# Patient Record
Sex: Male | Born: 1960 | Race: White | Hispanic: No | Marital: Single | Smoking: Never smoker
Health system: Southern US, Community
[De-identification: ages and names within clinical notes are randomized; demographics above are authoritative.]

## PROBLEM LIST (undated history)

## (undated) DIAGNOSIS — E039 Hypothyroidism, unspecified: Secondary | ICD-10-CM

## (undated) DIAGNOSIS — I82409 Acute embolism and thrombosis of unspecified deep veins of unspecified lower extremity: Secondary | ICD-10-CM

## (undated) DIAGNOSIS — S129XXA Fracture of neck, unspecified, initial encounter: Secondary | ICD-10-CM

## (undated) DIAGNOSIS — I499 Cardiac arrhythmia, unspecified: Secondary | ICD-10-CM

## (undated) DIAGNOSIS — I4891 Unspecified atrial fibrillation: Secondary | ICD-10-CM

## (undated) DIAGNOSIS — F819 Developmental disorder of scholastic skills, unspecified: Secondary | ICD-10-CM

## (undated) DIAGNOSIS — K219 Gastro-esophageal reflux disease without esophagitis: Secondary | ICD-10-CM

## (undated) DIAGNOSIS — I4892 Unspecified atrial flutter: Secondary | ICD-10-CM

## (undated) DIAGNOSIS — S79919A Unspecified injury of unspecified hip, initial encounter: Secondary | ICD-10-CM

## (undated) HISTORY — DX: Fracture of neck, unspecified, initial encounter: S12.9XXA

## (undated) HISTORY — PX: CARDIAC ELECTROPHYSIOLOGY STUDY AND ABLATION: SHX1294

## (undated) HISTORY — PX: NECK SURGERY: SHX720

## (undated) HISTORY — DX: Unspecified atrial flutter: I48.92

## (undated) HISTORY — PX: INGUINAL HERNIA REPAIR: SUR1180

---

## 2009-03-07 ENCOUNTER — Encounter: Admission: RE | Admit: 2009-03-07 | Discharge: 2009-03-07 | Payer: Self-pay | Admitting: Cardiology

## 2009-09-18 ENCOUNTER — Ambulatory Visit (HOSPITAL_COMMUNITY): Admission: RE | Admit: 2009-09-18 | Discharge: 2009-09-18 | Payer: Self-pay | Admitting: Cardiology

## 2010-05-15 DIAGNOSIS — I4892 Unspecified atrial flutter: Secondary | ICD-10-CM | POA: Insufficient documentation

## 2010-05-16 ENCOUNTER — Ambulatory Visit: Payer: Self-pay | Admitting: Internal Medicine

## 2010-05-18 ENCOUNTER — Telehealth: Payer: Self-pay | Admitting: Internal Medicine

## 2010-05-28 ENCOUNTER — Telehealth: Payer: Self-pay | Admitting: Internal Medicine

## 2010-06-07 ENCOUNTER — Encounter: Payer: Self-pay | Admitting: Internal Medicine

## 2010-06-12 ENCOUNTER — Ambulatory Visit: Payer: Self-pay | Admitting: Internal Medicine

## 2010-06-12 LAB — CONVERTED CEMR LAB
BUN: 14 mg/dL (ref 6–23)
CO2: 30 meq/L (ref 19–32)
Calcium: 9.6 mg/dL (ref 8.4–10.5)
Creatinine, Ser: 0.8 mg/dL (ref 0.4–1.5)
Eosinophils Absolute: 0.1 10*3/uL (ref 0.0–0.7)
Eosinophils Relative: 1.5 % (ref 0.0–5.0)
Glucose, Bld: 42 mg/dL — CL (ref 70–99)
INR: 1 (ref 0.8–1.0)
Lymphocytes Relative: 29.6 % (ref 12.0–46.0)
MCHC: 34.9 g/dL (ref 30.0–36.0)
MCV: 100.6 fL — ABNORMAL HIGH (ref 78.0–100.0)
Monocytes Absolute: 0.5 10*3/uL (ref 0.1–1.0)
Neutrophils Relative %: 56.4 % (ref 43.0–77.0)
Platelets: 242 10*3/uL (ref 150.0–400.0)
Potassium: 4.3 meq/L (ref 3.5–5.1)
Prothrombin Time: 11 s (ref 9.7–11.8)
RBC: 4.73 M/uL (ref 4.22–5.81)
Sodium: 141 meq/L (ref 135–145)
WBC: 4.6 10*3/uL (ref 4.5–10.5)
aPTT: 36.4 s — ABNORMAL HIGH (ref 21.7–28.8)

## 2010-06-19 ENCOUNTER — Ambulatory Visit: Payer: Self-pay | Admitting: Internal Medicine

## 2010-06-19 ENCOUNTER — Observation Stay (HOSPITAL_COMMUNITY): Admission: RE | Admit: 2010-06-19 | Discharge: 2010-06-20 | Payer: Self-pay | Admitting: Internal Medicine

## 2010-06-21 ENCOUNTER — Telehealth: Payer: Self-pay | Admitting: Internal Medicine

## 2010-07-30 ENCOUNTER — Ambulatory Visit: Payer: Self-pay | Admitting: Internal Medicine

## 2011-01-24 NOTE — Progress Notes (Signed)
Summary: HAS POST SURGERY QUESTIONS  Phone Note Call from Patient   Caller: Patient [phone] Reason for Call: Talk to Nurse Summary of Call: HAS POST SURGERY QUESTIONS-PLS CALLL [phone] Initial call taken by: Glynda Jaeger,  June 21, 2010 2:38 PM  Follow-up for Phone Call        still having to change the bandage in groin area daily.  Stilling having some bleeding but not bad.  he is going t o continue to watch area.  He does not feel like it is excessive.  Will moave appontment up to 07/30/10 because he says Dr Johney Frame says f/u in 4 weeks Dennis Bast, RN, BSN  June 21, 2010 4:42 PM

## 2011-01-24 NOTE — Progress Notes (Signed)
Summary: med question  Phone Note Call from Patient Call back at Home Phone [phone]   Caller: Patient Reason for Call: Talk to Nurse Summary of Call: calling back about meds, request call back Initial call taken by: [name],  May 28, 2010 9:42 AM  Follow-up for Phone Call        pt will need his Pradaxa called in   Called into [facility] Dennis Bast, RN, BSN  May 28, 2010 10:03 AM    New/Updated Medications: PRADAXA 150 MG CAPS (DABIGATRAN ETEXILATE MESYLATE) one by mouth two times a day with meals Prescriptions: PRADAXA 150 MG CAPS (DABIGATRAN ETEXILATE MESYLATE) one by mouth two times a day with meals  #60 x 1   Entered by:   Dennis Bast, RN, BSN   Authorized by:   Hillis Range, MD   Signed by:   Dennis Bast, RN, BSN on 05/28/2010   Method used:   Electronically to        [facility] (retail)       [address]       [place], [place]  [address]       Ph: [phone]       Fax: [phone]   RxID:   [id number]

## 2011-01-24 NOTE — Assessment & Plan Note (Signed)
Summary: eph/ gd   Visit Type:  follow-up  Referring Provider:  Viann Fish, MD Primary Provider:  Catha Gosselin, MD  [facility]   History of Present Illness: The patient presents today for routine electrophysiology followup. He reports doing very well since his atrial flutter ablation. The patient denies symptoms of palpitations, chest pain, shortness of breath, orthopnea, PND, lower extremity edema, dizziness, presyncope, syncope, or neurologic sequela. The patient is tolerating medications without difficulties and is otherwise without complaint today.   Current Medications (verified): 1)  None  Allergies (verified): No Known Drug Allergies  Past History:  Past Medical History: Atrial flutter s/p CTI ablation 6/11 mental retardation seasonal allergies  Past Surgical History: hernia repair prior cervical disc surgery CTI ablation for atrial flutter 06/20/10  Vital Signs:  Patient profile:   50 year old male Height:      77 inches Weight:      192 pounds BMI:     22.85 Pulse rate:   71 / minute BP sitting:   104 / 82  (left arm)  Vitals Entered By: Laurance Flatten CMA (July 30, 2010 1:54 PM)  Physical Exam  General:  tall and thin, NAD Head:  normocephalic and atraumatic Eyes:  PERRLA/EOM intact; conjunctiva and lids normal. Mouth:  Teeth, gums and palate normal. Oral mucosa normal. Neck:  Neck supple, no JVD. No masses, thyromegaly or abnormal cervical nodes. Lungs:  Clear bilaterally to auscultation and percussion. Heart:  Non-displaced PMI, chest non-tender; regular rate and rhythm, S1, S2 without murmurs, rubs or gallops. Carotid upstroke normal, no bruit. Normal abdominal aortic size, no bruits. Femorals normal pulses, no bruits. Pedals normal pulses. No edema, no varicosities. Abdomen:  Bowel sounds positive; abdomen soft and non-tender without masses, organomegaly, or hernias noted. No hepatosplenomegaly. Msk:  Back normal, normal gait. Muscle strength and tone  normal. Pulses:  pulses normal in all 4 extremities Extremities:  No clubbing or cyanosis. Neurologic:  Alert and oriented x 3.   EKG  Procedure date:  07/30/2010  Findings:      sinus rhythmj 70 bpm, otherwise normal ekg  Impression & Recommendations:  Problem # 1:  ATRIAL FLUTTER (ICD-427.32)  doing well s/p ablation stop pradaxa ASA 81mg  daily and Dr Donnie Aho to monitor for further atrial arrhythmias   His updated medication list for this problem includes:    Aspir-low 81 Mg Tbec (Aspirin)  Other Orders: EKG w/ Interpretation (93000)  Patient Instructions: 1)  Your physician recommends that you schedule a follow-up appointment as needed with DrAllred 2)  Your physician has recommended you make the following change in your medication: stop Pradaxa and start Aspirin 81mg  daily  Prevention & Chronic Care Immunizations   Influenza vaccine: Not documented    Tetanus booster: Not documented    Pneumococcal vaccine: Not documented  Other Screening   Smoking status: Not documented  Lipids   Total Cholesterol: Not documented   LDL: Not documented   LDL Direct: Not documented   HDL: Not documented   Triglycerides: Not documented

## 2011-01-24 NOTE — Assessment & Plan Note (Signed)
Summary: nep/atrial flutter/jml   Visit Type:  Initial Consult Referring Provider:  Viann Fish, MD Primary Provider:  Catha Gosselin, MD  [facility]  CC:  atrial flutter.  History of Present Illness: Joshua Moyer is a pleasant 50 yo WM with a h/o persistent atrial flutter who presents today for EP consultation.  He reports being diagnosed with atrial fibrillation 1 year ago after presenting for a routine physical exam.  He was referred to Dr Donnie Aho who started diltiazem for rate control.  He was cardioverted to sinus rhythm 08/2009.  The patient reports slight improvement in energy after cardioversion, but feels that in general his activity was unchanged.  He returned to see Dr Donnie Aho 4/11 and was found to have returned to atrial flutter.  Presently, the patient reports doing well.  The patient denies symptoms of palpitations, chest pain, shortness of breath, orthopnea, PND, lower extremity edema, presyncope, syncope, or neurologic sequela.  He has chronic stable orthostatic dizziness, which improves by not standing up too quickly.  The patient is tolerating medications without difficulties and is otherwise without complaint today.   Current Medications (verified): 1)  Diltiazem Hcl Er Beads 240 Mg Xr24h-Cap (Diltiazem Hcl Er Beads) .... Take One Capsule By Mouth Daily  Allergies (verified): No Known Drug Allergies  Past History:  Past Medical History: Atrial flutter (typical by EKG) mental retardation seasonal allergies  Past Surgical History: hernia repair prior cervical disc surgery  Family History: mother had diabetes father had CAD dx age 17s  Social History: Pt lives in [place] with his father.  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  He has developmental disability.  Tob- none.  ETOH- none.  Drugs- none  Review of Systems       All systems are reviewed and negative except as listed in the HPI.   Vital Signs:  Patient profile:   50 year old  male Height:      77 inches Weight:      194 pounds BMI:     23.09 Pulse rate:   86 / minute BP sitting:   122 / 90  (left arm)  Vitals Entered By: Laurance Flatten CMA (May 16, 2010 4:33 PM)  Physical Exam  General:  tall and thin, NAD Head:  normocephalic and atraumatic Eyes:  PERRLA/EOM intact; conjunctiva and lids normal. Nose:  no deformity, discharge, inflammation, or lesions Mouth:  Teeth, gums and palate normal. Oral mucosa normal. Neck:  Neck supple, no JVD. No masses, thyromegaly or abnormal cervical nodes. Lungs:  Clear bilaterally to auscultation and percussion. Heart:  iRRR, no m/r/g Abdomen:  Bowel sounds positive; abdomen soft and non-tender without masses, organomegaly, or hernias noted. No hepatosplenomegaly. Msk:  Back normal, normal gait. Muscle strength and tone normal. Pulses:  pulses normal in all 4 extremities Extremities:  No clubbing or cyanosis. Neurologic:  Alert and oriented x 3. Skin:  Intact without lesions or rashes. Cervical Nodes:  no significant adenopathy Psych:  cognative delay is noted   EKG  Procedure date:  05/16/2010  Findings:      typical appearing atrial flutter  Impression & Recommendations:  Problem # 1:  ATRIAL FLUTTER (ICD-427.32) The patient presents today for EP consultation regarding atrial flutter.  Therapeutic strategies for atrial flutter including medicine and ablation were discussed in detail with the patient today. Risk, benefits, and alternatives to EP study and radiofrequency ablation  were also discussed in detail today. These risks include but are not limited to  stroke, bleeding, vascular damage, tamponade, perforation, damage to the heart and other structures, pacemaker requirement, and death. The patient understands these risk and wishes to proceed.  We will start pradaxa 150mg  two times a day today.  After four weeks of pradaxa, we will proceed to atrial flutter ablation.  Other Orders: EKG w/ Interpretation (93000)

## 2011-01-24 NOTE — Progress Notes (Signed)
Summary: pt calling to set up procedure  lm to cb  Phone Note Call from Patient Call back at Home Phone [phone]   Caller: Patient Reason for Call: Talk to Nurse, Talk to Doctor, Referral Summary of Call: pt was calling to set up procedure Initial call taken by: Omer Jack,  May 18, 2010 10:14 AM  Follow-up for Phone Call        [facility] Lisabeth Devoid RN  returning call, [name]  May 18, 2010 1:50 PM  Joshua Moyer would like to know the date and time of his ablation. I told him Tresa Endo was out of the office today and would call him back next week.  He said that would be okay. Lisabeth Devoid RN     Appended Document: pt calling to set up procedure  lm to cb Tresa Endo, please follow-up on this.  Appended Document: pt calling to set up procedure  lm to cb already set up 06/19/10

## 2011-01-24 NOTE — Letter (Signed)
Summary: ELectrophysiology/Ablation Procedure Instructions  [facility], Main Office  [address]   [place], [place] [address]   Phone: [phone]  Fax: [phone]     Electrophysiology/Ablation Procedure Instructions    You are scheduled for a(n) ablation on 06/19/10 at 12:00 with Dr. Johney Frame.  1.  Please come to the Short Stay Center at [facility] at 10:00 on the day of your procedure.  2.  Come prepared to stay overnight.   Please bring your insurance cards and a list of your medications.  3.  Come to the [place] office on 06/12/10 at 10:00am for lab work.  You do not have to be fasting.  4.  Do not have anything to eat or drink after midnight the night before your procedure.  5.  Educational material received:     _____ Ablation   * Occasionally, EP studies and ablations can become lengthy.  Please make your family aware of this before your procedure starts.  Average time ranges from 2-8 hours for EP studies/ablations.  Your physician will locate your family after the procedure with the results.  * If you have any questions after you get home, please call the office at [phone].  Anselm Pancoast

## 2011-03-29 LAB — BASIC METABOLIC PANEL
BUN: 15 mg/dL (ref 6–23)
Calcium: 9.3 mg/dL (ref 8.4–10.5)
GFR calc non Af Amer: >60 mL/min (ref >60)
Glucose, Bld: 84 mg/dL (ref 70–99)

## 2011-03-29 LAB — PROTIME-INR
INR: 2.4 — ABNORMAL HIGH (ref 0.00–1.49)
Prothrombin Time: 26.1 seconds — ABNORMAL HIGH (ref 11.6–15.2)

## 2011-03-29 LAB — CBC
Platelets: 223 10*3/uL (ref 150–400)
RDW: 13 % (ref 11.5–15.5)
WBC: 4.1 10*3/uL (ref 4.0–10.5)

## 2012-03-27 ENCOUNTER — Encounter: Payer: Self-pay | Admitting: Internal Medicine

## 2012-04-07 ENCOUNTER — Ambulatory Visit: Payer: Medicare Other | Attending: Family Medicine | Admitting: Physical Therapy

## 2012-04-07 DIAGNOSIS — M25579 Pain in unspecified ankle and joints of unspecified foot: Secondary | ICD-10-CM | POA: Insufficient documentation

## 2012-04-07 DIAGNOSIS — M25673 Stiffness of unspecified ankle, not elsewhere classified: Secondary | ICD-10-CM | POA: Insufficient documentation

## 2012-04-07 DIAGNOSIS — IMO0001 Reserved for inherently not codable concepts without codable children: Secondary | ICD-10-CM | POA: Insufficient documentation

## 2012-04-07 DIAGNOSIS — M25676 Stiffness of unspecified foot, not elsewhere classified: Secondary | ICD-10-CM | POA: Insufficient documentation

## 2012-04-09 ENCOUNTER — Ambulatory Visit: Payer: Medicare Other | Admitting: Physical Therapy

## 2012-04-14 ENCOUNTER — Ambulatory Visit: Payer: Medicare Other | Admitting: Physical Therapy

## 2012-04-16 ENCOUNTER — Ambulatory Visit: Payer: Medicare Other | Admitting: Physical Therapy

## 2012-04-21 ENCOUNTER — Ambulatory Visit: Payer: Medicare Other | Admitting: Physical Therapy

## 2012-04-23 ENCOUNTER — Ambulatory Visit: Payer: Medicare Other | Attending: Family Medicine | Admitting: Physical Therapy

## 2012-04-23 DIAGNOSIS — M25579 Pain in unspecified ankle and joints of unspecified foot: Secondary | ICD-10-CM | POA: Insufficient documentation

## 2012-04-23 DIAGNOSIS — IMO0001 Reserved for inherently not codable concepts without codable children: Secondary | ICD-10-CM | POA: Insufficient documentation

## 2012-04-23 DIAGNOSIS — M25676 Stiffness of unspecified foot, not elsewhere classified: Secondary | ICD-10-CM | POA: Insufficient documentation

## 2012-04-23 DIAGNOSIS — M25673 Stiffness of unspecified ankle, not elsewhere classified: Secondary | ICD-10-CM | POA: Insufficient documentation

## 2012-04-28 ENCOUNTER — Ambulatory Visit: Payer: Medicare Other | Admitting: Physical Therapy

## 2012-04-30 ENCOUNTER — Ambulatory Visit: Payer: Medicare Other | Admitting: Physical Therapy

## 2012-05-01 ENCOUNTER — Encounter: Payer: Self-pay | Admitting: Internal Medicine

## 2012-05-01 ENCOUNTER — Ambulatory Visit (AMBULATORY_SURGERY_CENTER): Payer: Medicare Other

## 2012-05-01 VITALS — Ht 78.0 in | Wt 192.0 lb

## 2012-05-01 DIAGNOSIS — Z1211 Encounter for screening for malignant neoplasm of colon: Secondary | ICD-10-CM

## 2012-05-01 MED ORDER — PEG-KCL-NACL-NASULF-NA ASC-C 100 G PO SOLR: 1.0000 | Freq: Once | ORAL | Status: AC

## 2012-05-01 NOTE — Progress Notes (Signed)
Patient's father came with the patient to his pre-visit today to help explain the paperwork. Per the father,the patient is a slow learner and has some trouble reading, but he will come with his son to the colonoscopy appointment on 05/15/12.Ulis Rias RN

## 2012-05-05 ENCOUNTER — Ambulatory Visit: Payer: Medicare Other | Admitting: Physical Therapy

## 2012-05-15 ENCOUNTER — Ambulatory Visit (AMBULATORY_SURGERY_CENTER): Payer: Medicare Other | Admitting: Internal Medicine

## 2012-05-15 ENCOUNTER — Encounter: Payer: Self-pay | Admitting: Internal Medicine

## 2012-05-15 VITALS — BP 132/67 | HR 57 | Temp 97.7°F | Resp 13 | Ht 78.0 in | Wt 192.0 lb

## 2012-05-15 DIAGNOSIS — D126 Benign neoplasm of colon, unspecified: Secondary | ICD-10-CM

## 2012-05-15 DIAGNOSIS — Z1211 Encounter for screening for malignant neoplasm of colon: Secondary | ICD-10-CM

## 2012-05-15 MED ORDER — SODIUM CHLORIDE 0.9 % IV SOLN: 500.0000 mL | INTRAVENOUS | Status: DC

## 2012-05-15 MED ORDER — DICYCLOMINE HCL 10 MG PO CAPS: 20.0000 mg | ORAL_CAPSULE | Freq: Three times a day (TID) | ORAL | Status: DC

## 2012-05-15 NOTE — Patient Instructions (Addendum)
YOU HAD AN ENDOSCOPIC PROCEDURE TODAY AT THE [facility]: Refer to the procedure report that was given to you for any specific questions about what was found during the examination.  If the procedure report does not answer your questions, please call your gastroenterologist to clarify.  If you requested that your care partner not be given the details of your procedure findings, then the procedure report has been included in a sealed envelope for you to review at your convenience later.  YOU SHOULD EXPECT: Some feelings of bloating in the abdomen. Passage of more gas than usual.  Walking can help get rid of the air that was put into your GI tract during the procedure and reduce the bloating. If you had a lower endoscopy (such as a colonoscopy or flexible sigmoidoscopy) you may notice spotting of blood in your stool or on the toilet paper. If you underwent a bowel prep for your procedure, then you may not have a normal bowel movement for a few days.  DIET: Your first meal following the procedure should be a light meal and then it is ok to progress to your normal diet.  A half-sandwich or bowl of soup is an example of a good first meal.  Heavy or fried foods are harder to digest and may make you feel nauseous or bloated.  Likewise meals heavy in dairy and vegetables can cause extra gas to form and this can also increase the bloating.  Drink plenty of fluids but you should avoid alcoholic beverages for 24 hours.  ACTIVITY: Your care partner should take you home directly after the procedure.  You should plan to take it easy, moving slowly for the rest of the day.  You can resume normal activity the day after the procedure however you should NOT DRIVE or use heavy machinery for 24 hours (because of the sedation medicines used during the test).    SYMPTOMS TO REPORT IMMEDIATELY: A gastroenterologist can be reached at any hour.  During normal business hours, 8:30 AM to 5:00 PM Monday through Friday,  call [phone].  After hours and on weekends, please call the GI answering service at [phone] who will take a message and have the physician on call contact you.   Following lower endoscopy (colonoscopy or flexible sigmoidoscopy):  Excessive amounts of blood in the stool  Significant tenderness or worsening of abdominal pains  Swelling of the abdomen that is new, acute  Fever of 100F or higher    FOLLOW UP: If any biopsies were taken you will be contacted by phone or by letter within the next 1-3 weeks.  Call your gastroenterologist if you have not heard about the biopsies in 3 weeks.  Our staff will call the home number listed on your records the next business day following your procedure to check on you and address any questions or concerns that you may have at that time regarding the information given to you following your procedure. This is a courtesy call and so if there is no answer at the home number and we have not heard from you through the emergency physician on call, we will assume that you have returned to your regular daily activities without incident.  SIGNATURES/CONFIDENTIALITY: You and/or your care partner have signed paperwork which will be entered into your electronic medical record.  These signatures attest to the fact that that the information above on your After Visit Summary has been reviewed and is understood.  Full responsibility of the confidentiality   of this discharge information lies with you and/or your care-partner.   Information on polyps & high fiber diet  given to you today    

## 2012-05-15 NOTE — Op Note (Signed)
[facility] [address] [place], [place]  [address]  COLONOSCOPY PROCEDURE REPORT  PATIENT:  Joshua Moyer, Joshua Moyer  MR#:  [id number] BIRTHDATE:  12-16-1961, 51 yrs. old  GENDER:  male ENDOSCOPIST:  Hedwig Morton. Juanda Chance, MD REF. BY:  Catha Gosselin, M.D. PROCEDURE DATE:  05/15/2012 PROCEDURE:  Colonoscopy with snare polypectomy ASA CLASS:  Class II INDICATIONS:  Routine Risk Screening MEDICATIONS:   MAC sedation, administered by CRNA, propofol (Diprivan) 200 mg  DESCRIPTION OF PROCEDURE:   After the risks and benefits and of the procedure were explained, informed consent was obtained. Digital rectal exam was performed and revealed no rectal masses. The LB [id number] endoscope was introduced through the anus and advanced to the cecum, which was identified by both the appendix and ileocecal valve.  The quality of the prep was good, using MoviPrep.  The instrument was then slowly withdrawn as the colon was fully examined. <<PROCEDUREIMAGES>>  FINDINGS:  A sessile polyp was found. 8 mm polyp at 30 cm Polyp was snared without cautery. Retrieval was successful (see image4 and image3). snare polyp  This was otherwise a normal examination of the colon (see image5, image2, and image1).   Retroflexed views in the rectum revealed no abnormalities.    The scope was then withdrawn from the patient and the procedure completed.  COMPLICATIONS:  None ENDOSCOPIC IMPRESSION: 1) Sessile polyp 2) Otherwise normal examination RECOMMENDATIONS: 1) Await pathology results 2) High fiber diet.  REPEAT EXAM:  In 5 year(s) for.  ______________________________ Hedwig Morton. Juanda Chance, MD  CC:  n. eSIGNED:   Hedwig Morton. Darely Becknell at 05/15/2012 12:25 PM  Ellene Route, [phone]

## 2012-05-15 NOTE — Progress Notes (Signed)
Patient did not experience any of the following events: a burn prior to discharge; a fall within the facility; wrong site/side/patient/procedure/implant event; or a hospital transfer or hospital admission upon discharge from the facility. (G8907) Patient did not have preoperative order for IV antibiotic SSI prophylaxis. (G8918)  

## 2012-05-19 ENCOUNTER — Telehealth: Payer: Self-pay

## 2012-05-19 NOTE — Telephone Encounter (Signed)
Left message on answering machine. 

## 2012-05-21 ENCOUNTER — Encounter: Payer: Self-pay | Admitting: Internal Medicine

## 2013-04-28 ENCOUNTER — Telehealth: Payer: Self-pay | Admitting: Internal Medicine

## 2013-04-28 MED ORDER — CIPROFLOXACIN HCL 250 MG PO TABS: ORAL_TABLET | ORAL | Status: DC

## 2013-04-28 NOTE — Telephone Encounter (Signed)
Spoke with patient's father and gave him Dr. Regino Schultze recommendations. Rx sent to [facility] [address] per request. Patient's father will start Probiotic OTC daily.

## 2013-04-28 NOTE — Telephone Encounter (Signed)
Spoke with patient's father and he thinks patient had a virus.. He states the patient had diarrhea last week for 4-5 days. He would have 1 or 2 diarrhea stools/day. Also had one episode of vomiting and low grade fever of 99. He tried the Dicyclomine and it did not help so he took an Imodium x 1. He has not had a bowel movement now for 1 1/2 days but continues to have gas and lower abdominal cramping. He took Pepcid and it seem to help the gas. He will try the Dicyclomine again for the cramping and gas. If this does not help, he will call back.

## 2013-04-28 NOTE — Telephone Encounter (Signed)
Sounds like an infectious diarrhea. Please start Cipro 250 mg po bid x 5 days, #10, and a Probiotic OTC 1 ;po qd.

## 2013-05-03 ENCOUNTER — Telehealth: Payer: Self-pay | Admitting: Internal Medicine

## 2013-05-03 NOTE — Telephone Encounter (Signed)
Spoke with patient's father and he is doing better. Will take Bentyl prn.

## 2013-05-13 ENCOUNTER — Telehealth: Payer: Self-pay | Admitting: Internal Medicine

## 2013-05-13 MED ORDER — DICYCLOMINE HCL 10 MG PO CAPS: 20.0000 mg | ORAL_CAPSULE | Freq: Three times a day (TID) | ORAL | Status: DC

## 2013-05-13 NOTE — Telephone Encounter (Signed)
Pt rx has been sent and he is aware

## 2013-08-08 ENCOUNTER — Other Ambulatory Visit: Payer: Self-pay | Admitting: Internal Medicine

## 2013-09-17 ENCOUNTER — Other Ambulatory Visit: Payer: Self-pay | Admitting: Internal Medicine

## 2013-09-17 NOTE — Telephone Encounter (Signed)
PATIENT WILL NEED AN OFFICE VISIT FOR FURTHER REFILLS  

## 2013-10-03 ENCOUNTER — Other Ambulatory Visit: Payer: Self-pay | Admitting: Internal Medicine

## 2013-10-04 ENCOUNTER — Encounter: Payer: Self-pay | Admitting: Internal Medicine

## 2013-10-04 NOTE — Telephone Encounter (Signed)
Error

## 2016-05-03 DIAGNOSIS — E7800 Pure hypercholesterolemia, unspecified: Secondary | ICD-10-CM | POA: Diagnosis not present

## 2016-05-03 DIAGNOSIS — Z79899 Other long term (current) drug therapy: Secondary | ICD-10-CM | POA: Diagnosis not present

## 2016-05-03 DIAGNOSIS — E039 Hypothyroidism, unspecified: Secondary | ICD-10-CM | POA: Diagnosis not present

## 2016-05-03 DIAGNOSIS — I4891 Unspecified atrial fibrillation: Secondary | ICD-10-CM | POA: Diagnosis not present

## 2016-05-03 DIAGNOSIS — Z0000 Encounter for general adult medical examination without abnormal findings: Secondary | ICD-10-CM | POA: Diagnosis not present

## 2016-05-03 DIAGNOSIS — D7589 Other specified diseases of blood and blood-forming organs: Secondary | ICD-10-CM | POA: Diagnosis not present

## 2016-05-03 DIAGNOSIS — Z86010 Personal history of colonic polyps: Secondary | ICD-10-CM | POA: Diagnosis not present

## 2016-05-03 DIAGNOSIS — I4892 Unspecified atrial flutter: Secondary | ICD-10-CM | POA: Diagnosis not present

## 2016-05-03 DIAGNOSIS — Z125 Encounter for screening for malignant neoplasm of prostate: Secondary | ICD-10-CM | POA: Diagnosis not present

## 2016-05-03 DIAGNOSIS — F79 Unspecified intellectual disabilities: Secondary | ICD-10-CM | POA: Diagnosis not present

## 2016-07-15 DIAGNOSIS — H40013 Open angle with borderline findings, low risk, bilateral: Secondary | ICD-10-CM | POA: Diagnosis not present

## 2016-07-15 DIAGNOSIS — H2512 Age-related nuclear cataract, left eye: Secondary | ICD-10-CM | POA: Diagnosis not present

## 2016-07-15 DIAGNOSIS — H25011 Cortical age-related cataract, right eye: Secondary | ICD-10-CM | POA: Diagnosis not present

## 2016-07-15 DIAGNOSIS — H25012 Cortical age-related cataract, left eye: Secondary | ICD-10-CM | POA: Diagnosis not present

## 2016-07-15 DIAGNOSIS — H2511 Age-related nuclear cataract, right eye: Secondary | ICD-10-CM | POA: Diagnosis not present

## 2016-08-21 ENCOUNTER — Encounter (HOSPITAL_COMMUNITY): Payer: Self-pay | Admitting: Emergency Medicine

## 2016-08-21 ENCOUNTER — Inpatient Hospital Stay (HOSPITAL_COMMUNITY)
Admission: EM | Admit: 2016-08-21 | Discharge: 2016-08-26 | DRG: 372 | Disposition: A | Discharge status: Home Health | Payer: Medicare Other | Attending: General Surgery | Admitting: General Surgery

## 2016-08-21 ENCOUNTER — Emergency Department (HOSPITAL_COMMUNITY): Payer: Medicare Other

## 2016-08-21 DIAGNOSIS — E039 Hypothyroidism, unspecified: Secondary | ICD-10-CM | POA: Diagnosis not present

## 2016-08-21 DIAGNOSIS — F79 Unspecified intellectual disabilities: Secondary | ICD-10-CM | POA: Diagnosis not present

## 2016-08-21 DIAGNOSIS — F819 Developmental disorder of scholastic skills, unspecified: Secondary | ICD-10-CM | POA: Diagnosis present

## 2016-08-21 DIAGNOSIS — Z79899 Other long term (current) drug therapy: Secondary | ICD-10-CM | POA: Diagnosis not present

## 2016-08-21 DIAGNOSIS — I482 Chronic atrial fibrillation: Secondary | ICD-10-CM | POA: Diagnosis not present

## 2016-08-21 DIAGNOSIS — Z7982 Long term (current) use of aspirin: Secondary | ICD-10-CM | POA: Diagnosis not present

## 2016-08-21 DIAGNOSIS — I4892 Unspecified atrial flutter: Secondary | ICD-10-CM | POA: Diagnosis not present

## 2016-08-21 DIAGNOSIS — K3533 Acute appendicitis with perforation and localized peritonitis, with abscess: Secondary | ICD-10-CM

## 2016-08-21 DIAGNOSIS — R1031 Right lower quadrant pain: Secondary | ICD-10-CM | POA: Diagnosis not present

## 2016-08-21 DIAGNOSIS — K353 Acute appendicitis with localized peritonitis: Secondary | ICD-10-CM | POA: Diagnosis not present

## 2016-08-21 DIAGNOSIS — I4891 Unspecified atrial fibrillation: Secondary | ICD-10-CM

## 2016-08-21 DIAGNOSIS — K3580 Unspecified acute appendicitis: Secondary | ICD-10-CM | POA: Diagnosis not present

## 2016-08-21 HISTORY — DX: Developmental disorder of scholastic skills, unspecified: F81.9

## 2016-08-21 LAB — URINALYSIS, ROUTINE W REFLEX MICROSCOPIC
Glucose, UA: NEGATIVE mg/dL
Ketones, ur: >80 mg/dL — AB
NITRITE: POSITIVE — AB
PH: 5.5 (ref 5.0–8.0)
Protein, ur: 30 mg/dL — AB
SPECIFIC GRAVITY, URINE: 1.032 — AB (ref 1.005–1.030)

## 2016-08-21 LAB — CBC
HEMATOCRIT: 44.8 % (ref 39.0–52.0)
HEMOGLOBIN: 15.6 g/dL (ref 13.0–17.0)
MCH: 33.8 pg (ref 26.0–34.0)
MCHC: 34.8 g/dL (ref 30.0–36.0)
MCV: 97.2 fL (ref 78.0–100.0)
Platelets: 234 10*3/uL (ref 150–400)
RBC: 4.61 MIL/uL (ref 4.22–5.81)
RDW: 12.8 % (ref 11.5–15.5)
WBC: 13.2 10*3/uL — ABNORMAL HIGH (ref 4.0–10.5)

## 2016-08-21 LAB — COMPREHENSIVE METABOLIC PANEL
ALBUMIN: 4.1 g/dL (ref 3.5–5.0)
ALT: 20 U/L (ref 17–63)
ANION GAP: 7 (ref 5–15)
AST: 19 U/L (ref 15–41)
Alkaline Phosphatase: 53 U/L (ref 38–126)
BUN: 14 mg/dL (ref 6–20)
CO2: 27 mmol/L (ref 22–32)
Calcium: 9.4 mg/dL (ref 8.9–10.3)
Chloride: 102 mmol/L (ref 101–111)
Creatinine, Ser: 0.86 mg/dL (ref 0.61–1.24)
GFR calc Af Amer: >60 mL/min (ref >60)
GFR calc non Af Amer: >60 mL/min (ref >60)
GLUCOSE: 92 mg/dL (ref 65–99)
POTASSIUM: 4.3 mmol/L (ref 3.5–5.1)
SODIUM: 136 mmol/L (ref 135–145)
Total Bilirubin: 1.8 mg/dL — ABNORMAL HIGH (ref 0.3–1.2)
Total Protein: 8 g/dL (ref 6.5–8.1)

## 2016-08-21 LAB — LIPASE, BLOOD: LIPASE: 19 U/L (ref 11–51)

## 2016-08-21 LAB — URINE MICROSCOPIC-ADD ON

## 2016-08-21 MED ORDER — PIPERACILLIN-TAZOBACTAM 3.375 G IVPB 30 MIN
3.3750 g | Freq: Once | INTRAVENOUS | Status: AC
  Administered 2016-08-22: 3.375 g via INTRAVENOUS
  Filled 2016-08-21: qty 50

## 2016-08-21 MED ORDER — HYDROMORPHONE HCL 1 MG/ML IJ SOLN
1.0000 mg | Freq: Once | INTRAMUSCULAR | Status: AC
  Administered 2016-08-21: 1 mg via INTRAVENOUS
  Filled 2016-08-21: qty 1

## 2016-08-21 MED ORDER — SODIUM CHLORIDE 0.9 % IV BOLUS (SEPSIS)
1000.0000 mL | Freq: Once | INTRAVENOUS | Status: AC
  Administered 2016-08-21: 1000 mL via INTRAVENOUS

## 2016-08-21 MED ORDER — DIATRIZOATE MEGLUMINE & SODIUM 66-10 % PO SOLN: 15.0000 mL | Freq: Once | ORAL | Status: DC

## 2016-08-21 MED ORDER — IOPAMIDOL (ISOVUE-300) INJECTION 61%
100.0000 mL | Freq: Once | INTRAVENOUS | Status: AC | PRN
  Administered 2016-08-21: 100 mL via INTRAVENOUS

## 2016-08-21 NOTE — ED Triage Notes (Signed)
Pt was sent in to r/o appendicitis from [facility] clinic after having RLQ to periumbilical pain today. Denies N/V. Some loose stools. Alert and oriented.

## 2016-08-21 NOTE — ED Notes (Signed)
Pt is aware a urine sample is needed. 

## 2016-08-21 NOTE — ED Provider Notes (Signed)
[facility] DEPT Provider Note   CSN: [id number] Arrival date & time: 08/21/16  1920   History   Chief Complaint Chief Complaint  Patient presents with  . Abdominal Pain    HPI Joshua Moyer is a 55 y.o. male who presents with RLQ pain. PMH significant for atrial flutter and MR. He has been having pain across the lower abdomen for 2 days which woke him up from sleep. Past surgical history significant for right inguinal hernia repair. The pain today has localized to the RLQ. He reports subjective fever, chills, and decreased PO intake. Denies chest pain, SOB, N/V/D, constipation, melena/hematochezia, irritative voiding symptoms, testicular or penile pain or discharge.   HPI  Past Medical History:  Diagnosis Date  . Atrial flutter ([place])   . Broken neck ([place])    In 1996  . H/O mental retardation     Patient Active Problem List   Diagnosis Date Noted  . ATRIAL FLUTTER 05/15/2010    Past Surgical History:  Procedure Laterality Date  . CARDIAC ELECTROPHYSIOLOGY STUDY AND ABLATION    . INGUINAL HERNIA REPAIR     right  . NECK SURGERY     broke neck in [place] Medications    Prior to Admission medications   Medication Sig Start Date End Date Taking? Authorizing Provider  Ascorbic Acid (VITAMIN C) 1000 MG tablet Take 1,000 mg by mouth daily.    Historical Provider, MD  aspirin 325 MG EC tablet Take 325 mg by mouth as needed.    Historical Provider, MD  b complex vitamins tablet Take 1 tablet by mouth daily.    Historical Provider, MD  ciprofloxacin (CIPRO) 250 MG tablet Take one po BID x 5 days 04/28/13   Lafayette Dragon, MD  dicyclomine (BENTYL) 10 MG capsule TAKE TWO CAPSULES BY MOUTH FOUR TIMES DAILY BEFORE MEALS AND AT BEDTIME  09/17/13   Lafayette Dragon, MD  NON FORMULARY Kava Kava 500 mg Take one daily    Historical Provider, MD  NON FORMULARY Hio Joint-Take one daily    Historical Provider, MD  NON FORMULARY Gaba 500 mg-Take one daily    Historical Provider,  MD  vitamin A 7500 UNIT capsule Take by mouth. Take 500 units daily    Historical Provider, MD  vitamin E 400 UNIT capsule Take 400 Units by mouth daily.    Historical Provider, MD    Family History Family History  Problem Relation Age of Onset  . Diabetes Mother   . Breast cancer Mother     Social History Social History  Substance Use Topics  . Smoking status: Never Smoker  . Smokeless tobacco: Never Used  . Alcohol use No     Allergies   Review of patient's allergies indicates no known allergies.   Review of Systems Review of Systems  Constitutional: Positive for appetite change, chills and fever.  Respiratory: Negative for shortness of breath.   Cardiovascular: Negative for chest pain.  Gastrointestinal: Positive for abdominal pain. Negative for blood in stool, constipation, diarrhea, nausea and vomiting.  Genitourinary: Negative for dysuria, frequency and testicular pain.  All other systems reviewed and are negative.    Physical Exam Updated Vital Signs BP 125/87 (BP Location: Right Arm)   Pulse 100   Temp 98 F (36.7 C) (Oral)   Resp 18   Ht 6\' 5"  (1.956 m)   Wt 91.6 kg   SpO2 95%   BMI 23.95 kg/m   Physical  Exam  Constitutional: He is oriented to person, place, and time. He appears well-developed and well-nourished. He appears ill. No distress.  Uncomfortable appearing  HENT:  Head: Normocephalic and atraumatic.  Eyes: Conjunctivae are normal. Pupils are equal, round, and reactive to light. Right eye exhibits no discharge. Left eye exhibits no discharge. No scleral icterus.  Neck: Normal range of motion. Neck supple.  Cardiovascular: Normal rate.  An irregularly irregular rhythm present. Exam reveals no gallop and no friction rub.   No murmur heard. Pulmonary/Chest: Effort normal and breath sounds normal. No respiratory distress. He has no wheezes. He has no rales. He exhibits no tenderness.  Abdominal: Soft. Bowel sounds are normal. He exhibits no  distension and no mass. There is tenderness. There is rebound. There is no guarding. No hernia.  RLQ point tenderness with mild rebound  Musculoskeletal: He exhibits no edema.  Neurological: He is alert and oriented to person, place, and time.  Skin: Skin is warm and dry.  Psychiatric: He has a normal mood and affect.  Nursing note and vitals reviewed.    ED Treatments / Results  Labs (all labs ordered are listed, but only abnormal results are displayed) Labs Reviewed  COMPREHENSIVE METABOLIC PANEL - Abnormal; Notable for the following:       Result Value   Total Bilirubin 1.8 (*)    All other components within normal limits  CBC - Abnormal; Notable for the following:    WBC 13.2 (*)    All other components within normal limits  URINALYSIS, ROUTINE W REFLEX MICROSCOPIC (NOT AT [facility]) - Abnormal; Notable for the following:    Color, Urine ORANGE (*)    APPearance TURBID (*)    Specific Gravity, Urine 1.032 (*)    Hgb urine dipstick TRACE (*)    Bilirubin Urine MODERATE (*)    Ketones, ur >80 (*)    Protein, ur 30 (*)    Nitrite POSITIVE (*)    Leukocytes, UA SMALL (*)    All other components within normal limits  URINE MICROSCOPIC-ADD ON - Abnormal; Notable for the following:    Squamous Epithelial / LPF 0-5 (*)    Bacteria, UA FEW (*)    All other components within normal limits  LIPASE, BLOOD    EKG  EKG Interpretation None       Radiology Ct Abdomen Pelvis W Contrast  Result Date: 08/21/2016 CLINICAL DATA:  Right lower quadrant pain radiating to the umbilicus EXAM: CT ABDOMEN AND PELVIS WITH CONTRAST TECHNIQUE: Multidetector CT imaging of the abdomen and pelvis was performed using the standard protocol following bolus administration of intravenous contrast. CONTRAST:  145mL ISOVUE-300 IOPAMIDOL (ISOVUE-300) INJECTION 61% COMPARISON:  None. FINDINGS: Lower chest: Incompletely visualized 5 mm pulmonary nodule within the right middle lobe. No pleural effusion. No  visible pericardial effusion. Hepatobiliary: Normal hepatic size and contours without focal liver lesion. No perihepatic ascites. No intra- or extrahepatic biliary dilatation. Normal gallbladder. Pancreas: Normal pancreatic contours and enhancement. No peripancreatic fluid collection or pancreatic ductal dilatation. Spleen: Normal. Adrenals/Urinary Tract: Normal adrenal glands. No hydronephrosis or solid renal mass. Stomach/Bowel: The appendix is enlarged, measuring up to 1.1 cm. An appendicolith is seen within the distal appendiceal lumen. There is severe surrounding inflammatory stranding along the course of the appendix and adjacent to the cecum. There is a small fluid collection within the right pericolic gutter measuring 3.1 x 2.2 x 6.0 cm. No extraluminal gas is identified. There is mild peritoneal thickening in the right lower quadrant.  Vascular/Lymphatic: Normal course and caliber of the major abdominal vessels. No abdominal or pelvic adenopathy. Reproductive: Normal prostate and seminal vesicles. Musculoskeletal: Moderate osteoarthrosis of the hips. Multilevel lumbar facet arthrosis and thoracolumbar osteophytosis. No lytic or blastic lesions. The visualized extraperitoneal and extrathoracic soft tissues are normal. Other: No contributory non-categorized findings. IMPRESSION: 1. Acute appendicitis with severe right lower quadrant inflammatory changes and adjacent fluid collection, likely indicating perforation and abscess formation. 2. 5 mm right middle lobe pulmonary nodule. No follow-up needed if patient is low-risk. Non-contrast chest CT can be considered in 12 months if patient is high-risk. This recommendation follows the consensus statement: Guidelines for Management of Incidental Pulmonary Nodules Detected on CT Images:From the Fleischner Society 2017; published online before print (10.1148/radiol.IJ:2314499). These results will be called to the ordering clinician or representative by the Radiologist  Assistant, and communication documented in the PACS or zVision Dashboard. Electronically Signed   By: Ulyses Jarred M.D.   On: 08/21/2016 23:24    Procedures Procedures (including critical care time)  Medications Ordered in ED Medications  diatrizoate meglumine-sodium (GASTROGRAFIN) 66-10 % solution 15 mL (not administered)  piperacillin-tazobactam (ZOSYN) IVPB 3.375 g (not administered)  sodium chloride 0.9 % bolus 1,000 mL (1,000 mLs Intravenous New Bag/Given 08/21/16 2223)  HYDROmorphone (DILAUDID) injection 1 mg (1 mg Intravenous Given 08/21/16 2223)  iopamidol (ISOVUE-300) 61 % injection 100 mL (100 mLs Intravenous Contrast Given 08/21/16 2300)     Initial Impression / Assessment and Plan / ED Course  I have reviewed the triage vital signs and the nursing notes.  Pertinent labs & imaging results that were available during my care of the patient were reviewed by me and considered in my medical decision making (see chart for details).  Clinical Course   55 year old male presents with acute appendicitis with possible perforation and abscess. Patient is afebrile, not tachycardic or tachypneic, normotensive, and not hypoxic. CBC is remarkable for leukocytosis of 13.2. CMP remarkable for elevated total bilirubin of 1.8. UA remarkable for few bacteria, moderate bilirubin, trace hgb, >80 ketones, small leukocytes, positive nitrites, 30 protein, elevated specific gravity.   CT shows appendicitis with perforation and abscess. Abscess measures 3.1 x 2.2 x 6.0 cm. IVF, Dilaudid, and Zosyn given. Spoke with Dr. Marlou Starks with surgery who will come see patient  Final Clinical Impressions(s) / ED Diagnoses   Final diagnoses:  Acute appendicitis with perforation and peritoneal abscess    New Prescriptions New Prescriptions   No medications on file     Recardo Evangelist, PA-C 08/21/16 2353    Davonna Belling, MD 08/22/16 [phone]

## 2016-08-21 NOTE — ED Notes (Signed)
Pt being sent by [facility] In clinic r/o appy.  Pt c/o RLQ abdominal pain.

## 2016-08-22 ENCOUNTER — Encounter (HOSPITAL_COMMUNITY): Payer: Self-pay | Admitting: Radiology

## 2016-08-22 ENCOUNTER — Inpatient Hospital Stay (HOSPITAL_COMMUNITY): Payer: Medicare Other

## 2016-08-22 DIAGNOSIS — E039 Hypothyroidism, unspecified: Secondary | ICD-10-CM | POA: Diagnosis present

## 2016-08-22 DIAGNOSIS — I482 Chronic atrial fibrillation: Secondary | ICD-10-CM | POA: Diagnosis not present

## 2016-08-22 DIAGNOSIS — Z7982 Long term (current) use of aspirin: Secondary | ICD-10-CM | POA: Diagnosis not present

## 2016-08-22 DIAGNOSIS — Z79899 Other long term (current) drug therapy: Secondary | ICD-10-CM | POA: Diagnosis not present

## 2016-08-22 DIAGNOSIS — I4892 Unspecified atrial flutter: Secondary | ICD-10-CM | POA: Diagnosis present

## 2016-08-22 DIAGNOSIS — R1031 Right lower quadrant pain: Secondary | ICD-10-CM | POA: Diagnosis not present

## 2016-08-22 DIAGNOSIS — K3533 Acute appendicitis with perforation and localized peritonitis, with abscess: Secondary | ICD-10-CM | POA: Diagnosis present

## 2016-08-22 DIAGNOSIS — F79 Unspecified intellectual disabilities: Secondary | ICD-10-CM | POA: Diagnosis present

## 2016-08-22 DIAGNOSIS — K353 Acute appendicitis with localized peritonitis: Secondary | ICD-10-CM | POA: Diagnosis not present

## 2016-08-22 LAB — BASIC METABOLIC PANEL
Anion gap: 6 (ref 5–15)
BUN: 14 mg/dL (ref 6–20)
CALCIUM: 8.5 mg/dL — AB (ref 8.9–10.3)
CO2: 25 mmol/L (ref 22–32)
CREATININE: 0.67 mg/dL (ref 0.61–1.24)
Chloride: 106 mmol/L (ref 101–111)
GFR calc non Af Amer: >60 mL/min (ref >60)
GLUCOSE: 100 mg/dL — AB (ref 65–99)
Potassium: 4 mmol/L (ref 3.5–5.1)
Sodium: 137 mmol/L (ref 135–145)

## 2016-08-22 LAB — CBC
HCT: 38.5 % — ABNORMAL LOW (ref 39.0–52.0)
Hemoglobin: 13.5 g/dL (ref 13.0–17.0)
MCH: 33.4 pg (ref 26.0–34.0)
MCHC: 35.1 g/dL (ref 30.0–36.0)
MCV: 95.3 fL (ref 78.0–100.0)
PLATELETS: 206 10*3/uL (ref 150–400)
RBC: 4.04 MIL/uL — ABNORMAL LOW (ref 4.22–5.81)
RDW: 12.7 % (ref 11.5–15.5)
WBC: 10.8 10*3/uL — ABNORMAL HIGH (ref 4.0–10.5)

## 2016-08-22 LAB — PROTIME-INR
INR: 1.05
PROTHROMBIN TIME: 13.8 s (ref 11.4–15.2)

## 2016-08-22 MED ORDER — FLUMAZENIL 0.5 MG/5ML IV SOLN
INTRAVENOUS | Status: AC
  Filled 2016-08-22: qty 5

## 2016-08-22 MED ORDER — PIPERACILLIN-TAZOBACTAM 3.375 G IVPB
3.3750 g | Freq: Three times a day (TID) | INTRAVENOUS | Status: DC
  Administered 2016-08-22 – 2016-08-26 (×12): 3.375 g via INTRAVENOUS
  Filled 2016-08-22 (×16): qty 50

## 2016-08-22 MED ORDER — FENTANYL CITRATE (PF) 100 MCG/2ML IJ SOLN
INTRAMUSCULAR | Status: AC | PRN
  Administered 2016-08-22: 50 ug via INTRAVENOUS
  Administered 2016-08-22 (×2): 25 ug via INTRAVENOUS

## 2016-08-22 MED ORDER — HYDROMORPHONE HCL 1 MG/ML IJ SOLN: 0.5000 mg | INTRAMUSCULAR | Status: DC | PRN

## 2016-08-22 MED ORDER — ACETAMINOPHEN 650 MG RE SUPP: 650.0000 mg | Freq: Four times a day (QID) | RECTAL | Status: DC | PRN

## 2016-08-22 MED ORDER — MORPHINE SULFATE (PF) 2 MG/ML IV SOLN: 1.0000 mg | INTRAVENOUS | Status: DC | PRN

## 2016-08-22 MED ORDER — KETOROLAC TROMETHAMINE 15 MG/ML IJ SOLN
15.0000 mg | Freq: Four times a day (QID) | INTRAMUSCULAR | Status: DC | PRN
  Administered 2016-08-22: 30 mg via INTRAVENOUS
  Filled 2016-08-22: qty 2
  Filled 2016-08-22: qty 1

## 2016-08-22 MED ORDER — ACETAMINOPHEN 325 MG PO TABS
325.0000 mg | ORAL_TABLET | Freq: Four times a day (QID) | ORAL | Status: DC | PRN
  Administered 2016-08-22: 650 mg via ORAL
  Filled 2016-08-22 (×2): qty 2

## 2016-08-22 MED ORDER — ONDANSETRON 4 MG PO TBDP: 4.0000 mg | ORAL_TABLET | Freq: Four times a day (QID) | ORAL | Status: DC | PRN

## 2016-08-22 MED ORDER — MIDAZOLAM HCL 2 MG/2ML IJ SOLN
INTRAMUSCULAR | Status: AC | PRN
  Administered 2016-08-22: 0.5 mg via INTRAVENOUS
  Administered 2016-08-22: 1 mg via INTRAVENOUS
  Administered 2016-08-22: 0.5 mg via INTRAVENOUS

## 2016-08-22 MED ORDER — KCL IN DEXTROSE-NACL 20-5-0.9 MEQ/L-%-% IV SOLN
INTRAVENOUS | Status: DC
  Administered 2016-08-22 – 2016-08-26 (×8): via INTRAVENOUS
  Filled 2016-08-22 (×9): qty 1000

## 2016-08-22 MED ORDER — ONDANSETRON HCL 4 MG/2ML IJ SOLN: 4.0000 mg | Freq: Four times a day (QID) | INTRAMUSCULAR | Status: DC | PRN

## 2016-08-22 MED ORDER — NALOXONE HCL 0.4 MG/ML IJ SOLN
INTRAMUSCULAR | Status: AC
  Filled 2016-08-22: qty 1

## 2016-08-22 MED ORDER — DICYCLOMINE HCL 10 MG PO CAPS
10.0000 mg | ORAL_CAPSULE | Freq: Three times a day (TID) | ORAL | Status: DC
  Administered 2016-08-22 – 2016-08-26 (×16): 10 mg via ORAL
  Filled 2016-08-22 (×21): qty 1

## 2016-08-22 MED ORDER — MIDAZOLAM HCL 2 MG/2ML IJ SOLN
INTRAMUSCULAR | Status: AC
  Filled 2016-08-22: qty 2

## 2016-08-22 MED ORDER — DILTIAZEM HCL ER COATED BEADS 120 MG PO CP24
120.0000 mg | ORAL_CAPSULE | Freq: Every day | ORAL | Status: DC
  Administered 2016-08-22 – 2016-08-26 (×5): 120 mg via ORAL
  Filled 2016-08-22 (×5): qty 1

## 2016-08-22 MED ORDER — LEVOTHYROXINE SODIUM 25 MCG PO TABS
25.0000 ug | ORAL_TABLET | Freq: Every day | ORAL | Status: DC
  Administered 2016-08-22 – 2016-08-26 (×5): 25 ug via ORAL
  Filled 2016-08-22 (×5): qty 1

## 2016-08-22 MED ORDER — FAMOTIDINE IN NACL 20-0.9 MG/50ML-% IV SOLN
20.0000 mg | Freq: Two times a day (BID) | INTRAVENOUS | Status: DC
  Administered 2016-08-22 – 2016-08-23 (×4): 20 mg via INTRAVENOUS
  Filled 2016-08-22 (×5): qty 50

## 2016-08-22 MED ORDER — DEXTROSE 5 % IV SOLN
1000.0000 mg | Freq: Four times a day (QID) | INTRAVENOUS | Status: DC | PRN
  Filled 2016-08-22: qty 10

## 2016-08-22 MED ORDER — LACTATED RINGERS IV BOLUS (SEPSIS)
1000.0000 mL | Freq: Once | INTRAVENOUS | Status: AC
  Administered 2016-08-22: 1000 mL via INTRAVENOUS

## 2016-08-22 MED ORDER — FENTANYL CITRATE (PF) 100 MCG/2ML IJ SOLN
INTRAMUSCULAR | Status: AC
  Filled 2016-08-22: qty 2

## 2016-08-22 MED ORDER — HEPARIN SODIUM (PORCINE) 5000 UNIT/ML IJ SOLN: 5000.0000 [IU] | Freq: Three times a day (TID) | INTRAMUSCULAR | Status: DC

## 2016-08-22 MED ORDER — LACTATED RINGERS IV BOLUS (SEPSIS): 1000.0000 mL | Freq: Three times a day (TID) | INTRAVENOUS | Status: AC | PRN

## 2016-08-22 NOTE — Procedures (Signed)
CT RLQ abscess drain placement 59ml purulent out, sent for GS, C&S No complication No blood loss. See complete dictation in [facility].

## 2016-08-22 NOTE — Consult Note (Signed)
Cardiology Consult Note  Admit date: 08/21/2016 Name: Joshua Moyer 55 y.o.  male DOB:  September 19, 1961 MRN:  [id number]  Today's date:  08/22/2016  Referring Physician:    [facility] Surgery  Primary Physician:    Dr. Hulan Fess  Reason for Consultation:    Atrial fibrillation   IMPRESSIONS: 1.  Atrial fibrillation currently controlled asymptomatic as and chronic 2.  History of atrial flutter ablation 3.  History of mental retardation  RECOMMENDATION: No further cardiac workup is necessary.  His CHA2DS2VASC score is 0 and he can just use aspirin for anticoagulation.  Please call if further help needed.  HISTORY: This 55 year old male was seen by me several years ago when he was noted to be in atrial flutter.  He eventually underwent an atrial flutter ablation and evidently went back out of rhythm about 6 months after the ablation but I never saw him since then.  He has been maintained on aspirin.  He normally swims and is involved in normal physical activities without symptoms.  He was admitted with a ruptured appendix and I currently underwent a drain today.  Atrial fibrillation with controlled response was noted on the admission EKG and I was asked to see him.  He is totally asymptomatic.  He denies shortness of breath or chest pain.  No PND, orthopnea or edema.  Past Medical History:  Diagnosis Date  . Atrial flutter ([place])   . Broken neck ([place])    In 1996  . H/O mental retardation       Past Surgical History:  Procedure Laterality Date  . CARDIAC ELECTROPHYSIOLOGY STUDY AND ABLATION    . INGUINAL HERNIA REPAIR     right  . NECK SURGERY     broke neck in 1996     Allergies:  has No Known Allergies.   Medications: Prior to Admission medications   Medication Sig Start Date End Date Taking? Authorizing Provider  Ascorbic Acid (VITAMIN C) 1000 MG tablet Take 1,000 mg by mouth daily.   Yes Historical Provider, MD  b complex vitamins tablet Take 1 tablet by mouth  daily.   Yes Historical Provider, MD  dicyclomine (BENTYL) 10 MG capsule TAKE TWO CAPSULES BY MOUTH FOUR TIMES DAILY BEFORE MEALS AND AT BEDTIME  09/17/13  Yes Lafayette Dragon, MD  diltiazem [facility]) 120 MG 24 hr capsule Take 120 mg by mouth daily.  08/20/16  Yes Historical Provider, MD  levothyroxine (SYNTHROID, LEVOTHROID) 25 MCG tablet TAKE ONE TABLET BY MOUTH ONCE A DAY ON AN EMPTY STOMACH 07/14/16  Yes Historical Provider, MD  naproxen sodium (ANAPROX) 220 MG tablet Take 440 mg by mouth 2 (two) times daily as needed (pain).   Yes Historical Provider, MD  NON FORMULARY Kava Kava 500 mg Take one po daily   Yes Historical Provider, MD  NON FORMULARY Hio Joint-Take one po daily   Yes Historical Provider, MD  NON FORMULARY Gaba 500 mg-Take one po daily   Yes Historical Provider, MD  vitamin A 7500 UNIT capsule Take by mouth. Take 500 units daily   Yes Historical Provider, MD  vitamin E 400 UNIT capsule Take 400 Units by mouth daily.   Yes Historical Provider, MD  ciprofloxacin (CIPRO) 250 MG tablet Take one po BID x 5 days Patient not taking: Reported on 08/21/2016 04/28/13   Lafayette Dragon, MD    Family History: Family Status  Relation Status  . Mother Deceased  . Father Alive  . Sister Alive  . Brother  Alive  . Brother Alive  . Brother Alive    Social History:   reports that he has never smoked. He has never used smokeless tobacco. He reports that he does not drink alcohol or use drugs.   Social History   Social History Narrative  . No narrative on file    Review of Systems: Otherwise negative except as noted above.  Physical Exam: BP 112/64 (BP Location: Right Arm)   Pulse 98   Temp 97.9 F (36.6 C) (Oral)   Resp 16   Ht 6\' 5"  (1.956 m)   Wt 90.1 kg (198 lb 9.6 oz)   SpO2 95%   BMI 23.55 kg/m   General appearance: Pleasant male in no acute distress lying in bed Head: Normocephalic, without obvious abnormality, atraumatic, Balding male hair pattern Eyes: Strabismus present  on right, C&S clear Neck: no adenopathy, no carotid bruit, no JVD and supple, symmetrical, trachea midline Lungs: clear to auscultation bilaterally Heart: Irregular rhythm, normal S1 and S2, no S3 or murmur Abdomen: Drain in place, mild tenderness Rectal: deferred Extremities: extremities normal, atraumatic, no cyanosis or edema Pulses: 2+ and symmetric Neurologic: Grossly normal  Labs: CBC  Recent Labs  08/22/16 0451  WBC 10.8*  RBC 4.04*  HGB 13.5  HCT 38.5*  PLT 206  MCV 95.3  MCH 33.4  MCHC 35.1  RDW 12.7   CMP   Recent Labs  08/21/16 2044 08/22/16 0451  NA 136 137  K 4.3 4.0  CL 102 106  CO2 27 25  GLUCOSE 92 100*  BUN 14 14  CREATININE 0.86 0.67  CALCIUM 9.4 8.5*  PROT 8.0  --   ALBUMIN 4.1  --   AST 19  --   ALT 20  --   ALKPHOS 53  --   BILITOT 1.8*  --   GFRNONAA >60 >60  GFRAA >60 >60   EKG: Atrial fibrillation with controlled response, nonspecific ST changes  Signed:  W. Doristine Church MD [facility]   Cardiology Consultant  08/22/2016, 7:23 PM

## 2016-08-22 NOTE — H&P (Signed)
Joshua Moyer is an 55 y.o. male.   Chief Complaint: abdominal pain HPI:  The patient is a 55 year old white male who presents to the emergency department with abdominal pain that started on Monday. The pain was across his lower abdomen and has now moved to the right lower quadrant. He denies any nausea or vomiting. He denies any fevers or chills. He came to the emergency department where a CT scan shows evidence of a contained perforation of the appendix  Past Medical History:  Diagnosis Date  . Atrial flutter ([place])   . Broken neck ([place])    In 1996  . H/O mental retardation     Past Surgical History:  Procedure Laterality Date  . CARDIAC ELECTROPHYSIOLOGY STUDY AND ABLATION    . INGUINAL HERNIA REPAIR     right  . NECK SURGERY     broke neck in 1996    Family History  Problem Relation Age of Onset  . Diabetes Mother   . Breast cancer Mother    Social History:  reports that he has never smoked. He has never used smokeless tobacco. He reports that he does not drink alcohol or use drugs.  Allergies: No Known Allergies   (Not in a hospital admission)  Results for orders placed or performed during the hospital encounter of 08/21/16 (from the past 48 hour(s))  Lipase, blood     Status: None   Collection Time: 08/21/16  8:44 PM  Result Value Ref Range   Lipase 19 11 - 51 U/L  Comprehensive metabolic panel     Status: Abnormal   Collection Time: 08/21/16  8:44 PM  Result Value Ref Range   Sodium 136 135 - 145 mmol/L   Potassium 4.3 3.5 - 5.1 mmol/L   Chloride 102 101 - 111 mmol/L   CO2 27 22 - 32 mmol/L   Glucose, Bld 92 65 - 99 mg/dL   BUN 14 6 - 20 mg/dL   Creatinine, Ser 0.86 0.61 - 1.24 mg/dL   Calcium 9.4 8.9 - 10.3 mg/dL   Total Protein 8.0 6.5 - 8.1 g/dL   Albumin 4.1 3.5 - 5.0 g/dL   AST 19 15 - 41 U/L   ALT 20 17 - 63 U/L   Alkaline Phosphatase 53 38 - 126 U/L   Total Bilirubin 1.8 (H) 0.3 - 1.2 mg/dL   GFR calc non Af Amer >60 >60 mL/min   GFR calc Af  Amer >60 >60 mL/min    Comment: (NOTE) The eGFR has been calculated using the CKD EPI equation. This calculation has not been validated in all clinical situations. eGFR's persistently <60 mL/min signify possible Chronic Kidney Disease.    Anion gap 7 5 - 15  CBC     Status: Abnormal   Collection Time: 08/21/16  8:44 PM  Result Value Ref Range   WBC 13.2 (H) 4.0 - 10.5 K/uL   RBC 4.61 4.22 - 5.81 MIL/uL   Hemoglobin 15.6 13.0 - 17.0 g/dL   HCT 44.8 39.0 - 52.0 %   MCV 97.2 78.0 - 100.0 fL   MCH 33.8 26.0 - 34.0 pg   MCHC 34.8 30.0 - 36.0 g/dL   RDW 12.8 11.5 - 15.5 %   Platelets 234 150 - 400 K/uL  Urinalysis, Routine w reflex microscopic     Status: Abnormal   Collection Time: 08/21/16 10:50 PM  Result Value Ref Range   Color, Urine ORANGE (A) YELLOW    Comment: BIOCHEMICALS MAY BE AFFECTED BY  COLOR   APPearance TURBID (A) CLEAR   Specific Gravity, Urine 1.032 (H) 1.005 - 1.030   pH 5.5 5.0 - 8.0   Glucose, UA NEGATIVE NEGATIVE mg/dL   Hgb urine dipstick TRACE (A) NEGATIVE   Bilirubin Urine MODERATE (A) NEGATIVE   Ketones, ur >80 (A) NEGATIVE mg/dL   Protein, ur 30 (A) NEGATIVE mg/dL   Nitrite POSITIVE (A) NEGATIVE   Leukocytes, UA SMALL (A) NEGATIVE  Urine microscopic-add on     Status: Abnormal   Collection Time: 08/21/16 10:50 PM  Result Value Ref Range   Squamous Epithelial / LPF 0-5 (A) NONE SEEN   WBC, UA 0-5 0 - 5 WBC/hpf   RBC / HPF 0-5 0 - 5 RBC/hpf   Bacteria, UA FEW (A) NONE SEEN   Urine-Other MUCOUS PRESENT    Ct Abdomen Pelvis W Contrast  Result Date: 08/21/2016 CLINICAL DATA:  Right lower quadrant pain radiating to the umbilicus EXAM: CT ABDOMEN AND PELVIS WITH CONTRAST TECHNIQUE: Multidetector CT imaging of the abdomen and pelvis was performed using the standard protocol following bolus administration of intravenous contrast. CONTRAST:  180m ISOVUE-300 IOPAMIDOL (ISOVUE-300) INJECTION 61% COMPARISON:  None. FINDINGS: Lower chest: Incompletely visualized 5  mm pulmonary nodule within the right middle lobe. No pleural effusion. No visible pericardial effusion. Hepatobiliary: Normal hepatic size and contours without focal liver lesion. No perihepatic ascites. No intra- or extrahepatic biliary dilatation. Normal gallbladder. Pancreas: Normal pancreatic contours and enhancement. No peripancreatic fluid collection or pancreatic ductal dilatation. Spleen: Normal. Adrenals/Urinary Tract: Normal adrenal glands. No hydronephrosis or solid renal mass. Stomach/Bowel: The appendix is enlarged, measuring up to 1.1 cm. An appendicolith is seen within the distal appendiceal lumen. There is severe surrounding inflammatory stranding along the course of the appendix and adjacent to the cecum. There is a small fluid collection within the right pericolic gutter measuring 3.1 x 2.2 x 6.0 cm. No extraluminal gas is identified. There is mild peritoneal thickening in the right lower quadrant. Vascular/Lymphatic: Normal course and caliber of the major abdominal vessels. No abdominal or pelvic adenopathy. Reproductive: Normal prostate and seminal vesicles. Musculoskeletal: Moderate osteoarthrosis of the hips. Multilevel lumbar facet arthrosis and thoracolumbar osteophytosis. No lytic or blastic lesions. The visualized extraperitoneal and extrathoracic soft tissues are normal. Other: No contributory non-categorized findings. IMPRESSION: 1. Acute appendicitis with severe right lower quadrant inflammatory changes and adjacent fluid collection, likely indicating perforation and abscess formation. 2. 5 mm right middle lobe pulmonary nodule. No follow-up needed if patient is low-risk. Non-contrast chest CT can be considered in 12 months if patient is high-risk. This recommendation follows the consensus statement: Guidelines for Management of Incidental Pulmonary Nodules Detected on CT Images:From the Fleischner Society 2017; published online before print (10.1148/[id number]. These results  will be called to the ordering clinician or representative by the Radiologist Assistant, and communication documented in the PACS or zVision Dashboard. Electronically Signed   By: KUlyses JarredM.D.   On: 08/21/2016 23:24    Review of Systems  Constitutional: Negative.   HENT: Negative.   Eyes: Negative.   Respiratory: Negative.   Cardiovascular: Negative.   Gastrointestinal: Positive for abdominal pain. Negative for nausea and vomiting.  Genitourinary: Negative.   Musculoskeletal: Negative.   Skin: Negative.   Neurological: Negative.   Endo/Heme/Allergies: Negative.   Psychiatric/Behavioral: Negative.     Blood pressure 117/86, pulse 92, temperature 98 F (36.7 C), temperature source Oral, resp. rate 16, height 6' 5"  (1.956 m), weight 91.6 kg (202 lb), SpO2 99 %.  Physical Exam  Constitutional: He is oriented to person, place, and time. He appears well-developed and well-nourished.  HENT:  Head: Normocephalic and atraumatic.  Eyes: Conjunctivae and EOM are normal. Pupils are equal, round, and reactive to light.  Neck: Normal range of motion. Neck supple.  Cardiovascular: Normal rate, regular rhythm and normal heart sounds.   Respiratory: Effort normal and breath sounds normal.  GI: Soft.  The abdomen is soft but there is moderate focal tenderness in RLQ  Musculoskeletal: Normal range of motion.  Neurological: He is alert and oriented to person, place, and time.  Skin: Skin is warm and dry.  Psychiatric: He has a normal mood and affect. His behavior is normal.     Assessment/Plan  The patient appears to have acute appendicitis with evidence of contained perforation and forming an abscess. At this point I would recommend treating him with bowel rest and IV antibiotics. We will discuss possible drainage with interventional radiology in the morning. We will monitor him closely. If he improves then he may benefit from an interval appendectomy in 6 to 8 weeks  TOTH III,PAUL S,  MD 08/22/2016, 12:11 AM

## 2016-08-22 NOTE — Progress Notes (Signed)
Subjective: He still has pain started Tues and worse yesterday.  Sister notes he has had AF and ASA is only anticoagulant.  Awaiting IR procedure.    Objective: Vital signs in last 24 hours: Temp:  [97.5 F (36.4 C)-98.3 F (36.8 C)] 98.3 F (36.8 C) (08/31 1000) Pulse Rate:  [91-100] 95 (08/31 1000) Resp:  [16-20] 16 (08/31 1000) BP: (105-125)/(53-87) 114/70 (08/31 1000) SpO2:  [95 %-100 %] 98 % (08/31 1000) Weight:  [90.1 kg (198 lb 9.6 oz)-91.6 kg (202 lb)] 90.1 kg (198 lb 9.6 oz) (08/31 0112) Last BM Date: 08/21/16 Afebrile, VSS WBC is better CT 08/21/16:  Acute appendicitis with severe right lower quadrant inflammatory changes and adjacent fluid collection, likely indicating perforation and abscess formation.  Intake/Output from previous day: 08/30 0701 - 08/31 0700 In: 26.7 [I.V.:26.7] Out: 325 [Urine:325] Intake/Output this shift: Total I/O In: 0  Out: 300 [Urine:300]  General appearance: alert, cooperative and no distress Resp: clear to auscultation bilaterally GI: soft Tender RLq.    Lab Results:   Recent Labs  08/21/16 2044 08/22/16 0451  WBC 13.2* 10.8*  HGB 15.6 13.5  HCT 44.8 38.5*  PLT 234 206    BMET  Recent Labs  08/21/16 2044 08/22/16 0451  NA 136 137  K 4.3 4.0  CL 102 106  CO2 27 25  GLUCOSE 92 100*  BUN 14 14  CREATININE 0.86 0.67  CALCIUM 9.4 8.5*   PT/INR  Recent Labs  08/22/16 1015  LABPROT 13.8  INR 1.05     Recent Labs Lab 08/21/16 2044  AST 19  ALT 20  ALKPHOS 53  BILITOT 1.8*  PROT 8.0  ALBUMIN 4.1     Lipase     Component Value Date/Time   LIPASE 19 08/21/2016 2044     Studies/Results: Ct Abdomen Pelvis W Contrast  Result Date: 08/21/2016 CLINICAL DATA:  Right lower quadrant pain radiating to the umbilicus EXAM: CT ABDOMEN AND PELVIS WITH CONTRAST TECHNIQUE: Multidetector CT imaging of the abdomen and pelvis was performed using the standard protocol following bolus administration of intravenous  contrast. CONTRAST:  126mL ISOVUE-300 IOPAMIDOL (ISOVUE-300) INJECTION 61% COMPARISON:  None. FINDINGS: Lower chest: Incompletely visualized 5 mm pulmonary nodule within the right middle lobe. No pleural effusion. No visible pericardial effusion. Hepatobiliary: Normal hepatic size and contours without focal liver lesion. No perihepatic ascites. No intra- or extrahepatic biliary dilatation. Normal gallbladder. Pancreas: Normal pancreatic contours and enhancement. No peripancreatic fluid collection or pancreatic ductal dilatation. Spleen: Normal. Adrenals/Urinary Tract: Normal adrenal glands. No hydronephrosis or solid renal mass. Stomach/Bowel: The appendix is enlarged, measuring up to 1.1 cm. An appendicolith is seen within the distal appendiceal lumen. There is severe surrounding inflammatory stranding along the course of the appendix and adjacent to the cecum. There is a small fluid collection within the right pericolic gutter measuring 3.1 x 2.2 x 6.0 cm. No extraluminal gas is identified. There is mild peritoneal thickening in the right lower quadrant. Vascular/Lymphatic: Normal course and caliber of the major abdominal vessels. No abdominal or pelvic adenopathy. Reproductive: Normal prostate and seminal vesicles. Musculoskeletal: Moderate osteoarthrosis of the hips. Multilevel lumbar facet arthrosis and thoracolumbar osteophytosis. No lytic or blastic lesions. The visualized extraperitoneal and extrathoracic soft tissues are normal. Other: No contributory non-categorized findings. IMPRESSION: 1. Acute appendicitis with severe right lower quadrant inflammatory changes and adjacent fluid collection, likely indicating perforation and abscess formation. 2. 5 mm right middle lobe pulmonary nodule. No follow-up needed if patient is low-risk. Non-contrast chest  CT can be considered in 12 months if patient is high-risk. This recommendation follows the consensus statement: Guidelines for Management of Incidental  Pulmonary Nodules Detected on CT Images:From the Fleischner Society 2017; published online before print (10.1148/radiol.IJ:2314499). These results will be called to the ordering clinician or representative by the Radiologist Assistant, and communication documented in the PACS or zVision Dashboard. Electronically Signed   By: Ulyses Jarred M.D.   On: 08/21/2016 23:24   Prior to Admission medications   Medication Sig Start Date End Date Taking? Authorizing Provider  Ascorbic Acid (VITAMIN C) 1000 MG tablet Take 1,000 mg by mouth daily.   Yes Historical Provider, MD  b complex vitamins tablet Take 1 tablet by mouth daily.   Yes Historical Provider, MD  dicyclomine (BENTYL) 10 MG capsule TAKE TWO CAPSULES BY MOUTH FOUR TIMES DAILY BEFORE MEALS AND AT BEDTIME  09/17/13  Yes Lafayette Dragon, MD  diltiazem [facility]) 120 MG 24 hr capsule Take 120 mg by mouth daily.  08/20/16  Yes Historical Provider, MD  levothyroxine (SYNTHROID, LEVOTHROID) 25 MCG tablet TAKE ONE TABLET BY MOUTH ONCE A DAY ON AN EMPTY STOMACH 07/14/16  Yes Historical Provider, MD  naproxen sodium (ANAPROX) 220 MG tablet Take 440 mg by mouth 2 (two) times daily as needed (pain).   Yes Historical Provider, MD  NON FORMULARY Kava Kava 500 mg Take one po daily   Yes Historical Provider, MD  NON FORMULARY Hio Joint-Take one po daily   Yes Historical Provider, MD  NON FORMULARY Gaba 500 mg-Take one po daily   Yes Historical Provider, MD  vitamin A 7500 UNIT capsule Take by mouth. Take 500 units daily   Yes Historical Provider, MD  vitamin E 400 UNIT capsule Take 400 Units by mouth daily.   Yes Historical Provider, MD  ciprofloxacin (CIPRO) 250 MG tablet Take one po BID x 5 days Patient not taking: Reported on 08/21/2016 04/28/13   Lafayette Dragon, MD     Medications: . dicyclomine  10 mg Oral TID AC & HS  . diltiazem  120 mg Oral Daily  . famotidine (PEPCID) IV  20 mg Intravenous Q12H  . levothyroxine  25 mcg Oral QAC breakfast  .  piperacillin-tazobactam (ZOSYN)  IV  3.375 g Intravenous Q8H   . dextrose 5 % and 0.9 % NaCl with KCl 20 mEq/L 100 mL/hr at 08/22/16 1033    Assessment/Plan Acute appendicitis with perforation and abscess Hx of EP atrial flutter ablation procedure Atrial flutter on admit EKG - will ask Cardiology to see Hx of mental retardation Hypothyroid  FEN:  IV fluids/NPO ID: day 1 Zosyn DVT:  SCD - awaiting IR evaluation for drain  Plan:  Antibiotics/ IR drain today        LOS: 0 days    Terresa Marlett 08/22/2016 [id number]

## 2016-08-22 NOTE — Progress Notes (Signed)
MEDICATION RELATED CONSULT NOTE   IR Procedure Consult - Anticoagulant/Antiplatelet PTA/Inpatient Med List Review by Pharmacist    Procedure: CT- guided RLQ abscess drain placement    Completed: 08/22/16 at [facility]  Post-Procedural bleeding risk per IR MD assessment:  low  Antithrombotic medications on inpatient or PTA profile prior to procedure:    Heparin SQ for VTE prophylaxis order d/ced by MD this morning (8/31).  Patient has not received any AC since admission.   Plan:     - Will defer initiation of anticoagulant for VTE pxx to MD (since patient is not on any at this time) - If wish to start/resume heparin SQ post IR procedure, recommend waiting at least 4 hours to give first dose.  Pharmacy will sign off.  Re-consult [place] if need further assistance  Dia Sitter, PharmD, BCPS 08/22/2016 4:48 PM

## 2016-08-22 NOTE — Consult Note (Signed)
Chief Complaint: Patient was seen in consultation today for CT-guided aspiration/possible drainage of right lower quadrant abdomino-pelvic fluid collection Chief Complaint  Patient presents with  . Abdominal Pain    Referring Physician(s): Gross,S  Supervising Physician: Arne Cleveland  Patient Status: Inpatient  History of Present Illness: Joshua Moyer is a 55 y.o. male who presented to the hospital yesterday after several day history of abdominal pain, primarily in the right lower quadrant. At the time he denied nausea, vomiting ,fever or chills. Subsequent imaging revealed acute appendicitis with severe right lower quadrant inflammatory changes and adjacent fluid collection concerning for perforation or abscess formation. He was evaluated by surgery and request now received for CT guided aspiration and possible drainage of the right lower quadrant fluid collection. He is currently afebrile with WBC of 10.8, hemoglobin 13.5, platelets 206k, creatinine 0.67 and  normal PT /INR. He currently denies chest pain, dyspnea, N/V, cough, back pain or abnormal bleeding. He does have a mild headache as well as right lower quadrant discomfort.  Past Medical History:  Diagnosis Date  . Atrial flutter ([place])   . Broken neck ([place])    In 1996  . H/O mental retardation     Past Surgical History:  Procedure Laterality Date  . CARDIAC ELECTROPHYSIOLOGY STUDY AND ABLATION    . INGUINAL HERNIA REPAIR     right  . NECK SURGERY     broke neck in 1996    Allergies: Review of patient's allergies indicates no known allergies.  Medications: Prior to Admission medications   Medication Sig Start Date End Date Taking? Authorizing Provider  Ascorbic Acid (VITAMIN C) 1000 MG tablet Take 1,000 mg by mouth daily.   Yes Historical Provider, MD  b complex vitamins tablet Take 1 tablet by mouth daily.   Yes Historical Provider, MD  dicyclomine (BENTYL) 10 MG capsule TAKE TWO CAPSULES BY MOUTH FOUR  TIMES DAILY BEFORE MEALS AND AT BEDTIME  09/17/13  Yes Lafayette Dragon, MD  diltiazem [facility]) 120 MG 24 hr capsule Take 120 mg by mouth daily.  08/20/16  Yes Historical Provider, MD  levothyroxine (SYNTHROID, LEVOTHROID) 25 MCG tablet TAKE ONE TABLET BY MOUTH ONCE A DAY ON AN EMPTY STOMACH 07/14/16  Yes Historical Provider, MD  naproxen sodium (ANAPROX) 220 MG tablet Take 440 mg by mouth 2 (two) times daily as needed (pain).   Yes Historical Provider, MD  NON FORMULARY Kava Kava 500 mg Take one po daily   Yes Historical Provider, MD  NON FORMULARY Hio Joint-Take one po daily   Yes Historical Provider, MD  NON FORMULARY Gaba 500 mg-Take one po daily   Yes Historical Provider, MD  vitamin A 7500 UNIT capsule Take by mouth. Take 500 units daily   Yes Historical Provider, MD  vitamin E 400 UNIT capsule Take 400 Units by mouth daily.   Yes Historical Provider, MD  ciprofloxacin (CIPRO) 250 MG tablet Take one po BID x 5 days Patient not taking: Reported on 08/21/2016 04/28/13   Lafayette Dragon, MD     Family History  Problem Relation Age of Onset  . Diabetes Mother   . Breast cancer Mother     Social History   Social History  . Marital status: Single    Spouse name: N/A  . Number of children: N/A  . Years of education: N/A   Social History Main Topics  . Smoking status: Never Smoker  . Smokeless tobacco: Never Used  . Alcohol use No  .  Drug use: No  . Sexual activity: Not Asked   Other Topics Concern  . None   Social History Narrative  . None      Review of Systems see above  Vital Signs: BP 114/70 (BP Location: Right Arm)   Pulse 95   Temp 98.3 F (36.8 C) (Oral)   Resp 16   Ht 6\' 5"  (1.956 m)   Wt 198 lb 9.6 oz (90.1 kg)   SpO2 98%   BMI 23.55 kg/m   Physical Exam patient awake/alert. Chest clear to auscultation bilaterally. Heart with regular rate and irreg rhythm. Abdomen soft, positive bowel sounds, mildly tender right lower quadrant. Extremities with no  edema  Mallampati Score:     Imaging: Ct Abdomen Pelvis W Contrast  Result Date: 08/21/2016 CLINICAL DATA:  Right lower quadrant pain radiating to the umbilicus EXAM: CT ABDOMEN AND PELVIS WITH CONTRAST TECHNIQUE: Multidetector CT imaging of the abdomen and pelvis was performed using the standard protocol following bolus administration of intravenous contrast. CONTRAST:  140mL ISOVUE-300 IOPAMIDOL (ISOVUE-300) INJECTION 61% COMPARISON:  None. FINDINGS: Lower chest: Incompletely visualized 5 mm pulmonary nodule within the right middle lobe. No pleural effusion. No visible pericardial effusion. Hepatobiliary: Normal hepatic size and contours without focal liver lesion. No perihepatic ascites. No intra- or extrahepatic biliary dilatation. Normal gallbladder. Pancreas: Normal pancreatic contours and enhancement. No peripancreatic fluid collection or pancreatic ductal dilatation. Spleen: Normal. Adrenals/Urinary Tract: Normal adrenal glands. No hydronephrosis or solid renal mass. Stomach/Bowel: The appendix is enlarged, measuring up to 1.1 cm. An appendicolith is seen within the distal appendiceal lumen. There is severe surrounding inflammatory stranding along the course of the appendix and adjacent to the cecum. There is a small fluid collection within the right pericolic gutter measuring 3.1 x 2.2 x 6.0 cm. No extraluminal gas is identified. There is mild peritoneal thickening in the right lower quadrant. Vascular/Lymphatic: Normal course and caliber of the major abdominal vessels. No abdominal or pelvic adenopathy. Reproductive: Normal prostate and seminal vesicles. Musculoskeletal: Moderate osteoarthrosis of the hips. Multilevel lumbar facet arthrosis and thoracolumbar osteophytosis. No lytic or blastic lesions. The visualized extraperitoneal and extrathoracic soft tissues are normal. Other: No contributory non-categorized findings. IMPRESSION: 1. Acute appendicitis with severe right lower quadrant  inflammatory changes and adjacent fluid collection, likely indicating perforation and abscess formation. 2. 5 mm right middle lobe pulmonary nodule. No follow-up needed if patient is low-risk. Non-contrast chest CT can be considered in 12 months if patient is high-risk. This recommendation follows the consensus statement: Guidelines for Management of Incidental Pulmonary Nodules Detected on CT Images:From the Fleischner Society 2017; published online before print (10.1148/radiol.SG:[id number]). These results will be called to the ordering clinician or representative by the Radiologist Assistant, and communication documented in the PACS or zVision Dashboard. Electronically Signed   By: Ulyses Jarred M.D.   On: 08/21/2016 23:24    Labs:  CBC:  Recent Labs  08/21/16 2044 08/22/16 0451  WBC 13.2* 10.8*  HGB 15.6 13.5  HCT 44.8 38.5*  PLT 234 206    COAGS:  Recent Labs  08/22/16 1015  INR 1.05    BMP:  Recent Labs  08/21/16 2044 08/22/16 0451  NA 136 137  K 4.3 4.0  CL 102 106  CO2 27 25  GLUCOSE 92 100*  BUN 14 14  CALCIUM 9.4 8.5*  CREATININE 0.86 0.67  GFRNONAA >60 >60  GFRAA >60 >60    LIVER FUNCTION TESTS:  Recent Labs  08/21/16 2044  BILITOT 1.8*  AST 19  ALT 20  ALKPHOS 53  PROT 8.0  ALBUMIN 4.1    TUMOR MARKERS: No results for input(s): AFPTM, CEA, CA199, CHROMGRNA in the last 8760 hours.  Assessment and Plan: 55 y.o. male who presented to the hospital yesterday after several day history of abdominal pain, primarily in the right lower quadrant. At the time he denied nausea, vomiting ,fever or chills. Subsequent imaging revealed acute appendicitis with severe right lower quadrant inflammatory changes and adjacent fluid collection concerning for perforation or abscess formation. He was evaluated by surgery and request now received for CT guided aspiration and possible drainage of the right lower quadrant fluid collection. He is currently afebrile with WBC of  10.8, hemoglobin 13.5, platelets 206k, creatinine 0.67 and  normal PT /INR. Imaging studies were reviewed by Dr. Vernard Gambles. Plan is for CT-guided aspiration and possible drainage of the right lower quadrant fluid collection today. Details/risks of procedure, including but not limited to, internal bleeding, infection, injury to adjacent structures discussed with patient and family with their understanding and consent.   Thank you for this interesting consult.  I greatly enjoyed meeting Seibert Bjelland and look forward to participating in their care.  A copy of this report was sent to the requesting provider on this date.  Electronically Signed: D. Rowe Robert 08/22/2016, 12:10 PM   I spent a total of 25 minutes in face to face in clinical consultation, greater than 50% of which was counseling/coordinating care for CT-guided abdominal/pelvic fluid collection aspiration/possible drainage

## 2016-08-23 ENCOUNTER — Other Ambulatory Visit: Payer: Self-pay | Admitting: Radiology

## 2016-08-23 ENCOUNTER — Encounter (HOSPITAL_COMMUNITY): Payer: Self-pay | Admitting: Surgery

## 2016-08-23 DIAGNOSIS — K3533 Acute appendicitis with perforation and localized peritonitis, with abscess: Secondary | ICD-10-CM

## 2016-08-23 DIAGNOSIS — I4891 Unspecified atrial fibrillation: Secondary | ICD-10-CM

## 2016-08-23 DIAGNOSIS — F819 Developmental disorder of scholastic skills, unspecified: Secondary | ICD-10-CM | POA: Diagnosis present

## 2016-08-23 LAB — BASIC METABOLIC PANEL
ANION GAP: 7 (ref 5–15)
BUN: 7 mg/dL (ref 6–20)
CO2: 23 mmol/L (ref 22–32)
Calcium: 8.5 mg/dL — ABNORMAL LOW (ref 8.9–10.3)
Chloride: 110 mmol/L (ref 101–111)
Creatinine, Ser: 0.64 mg/dL (ref 0.61–1.24)
GLUCOSE: 93 mg/dL (ref 65–99)
POTASSIUM: 3.7 mmol/L (ref 3.5–5.1)
Sodium: 140 mmol/L (ref 135–145)

## 2016-08-23 LAB — CBC
HEMATOCRIT: 38.8 % — AB (ref 39.0–52.0)
Hemoglobin: 13.5 g/dL (ref 13.0–17.0)
MCH: 33.8 pg (ref 26.0–34.0)
MCHC: 34.8 g/dL (ref 30.0–36.0)
MCV: 97.2 fL (ref 78.0–100.0)
Platelets: 221 10*3/uL (ref 150–400)
RBC: 3.99 MIL/uL — AB (ref 4.22–5.81)
RDW: 12.8 % (ref 11.5–15.5)
WBC: 7.9 10*3/uL (ref 4.0–10.5)

## 2016-08-23 MED ORDER — ORAL CARE MOUTH RINSE
15.0000 mL | Freq: Two times a day (BID) | OROMUCOSAL | Status: DC
  Administered 2016-08-23 – 2016-08-25 (×6): 15 mL via OROMUCOSAL

## 2016-08-23 MED ORDER — SODIUM CHLORIDE 0.9 % IV SOLN: 250.0000 mL | INTRAVENOUS | Status: DC | PRN

## 2016-08-23 MED ORDER — FAMOTIDINE 20 MG PO TABS
20.0000 mg | ORAL_TABLET | Freq: Every day | ORAL | Status: DC
  Administered 2016-08-24 – 2016-08-26 (×3): 20 mg via ORAL
  Filled 2016-08-23 (×3): qty 1

## 2016-08-23 MED ORDER — SODIUM CHLORIDE 0.9% FLUSH: 3.0000 mL | Freq: Two times a day (BID) | INTRAVENOUS | Status: DC

## 2016-08-23 MED ORDER — ENOXAPARIN SODIUM 40 MG/0.4ML ~~LOC~~ SOLN
40.0000 mg | SUBCUTANEOUS | Status: DC
  Administered 2016-08-23 – 2016-08-25 (×3): 40 mg via SUBCUTANEOUS
  Filled 2016-08-23 (×3): qty 0.4

## 2016-08-23 MED ORDER — SODIUM CHLORIDE 0.9% FLUSH: 3.0000 mL | INTRAVENOUS | Status: DC | PRN

## 2016-08-23 MED ORDER — NAPROXEN 500 MG PO TABS
500.0000 mg | ORAL_TABLET | Freq: Two times a day (BID) | ORAL | Status: DC
  Administered 2016-08-23 – 2016-08-26 (×6): 500 mg via ORAL
  Filled 2016-08-23 (×6): qty 1

## 2016-08-23 NOTE — Progress Notes (Signed)
Spoke with patient and sister at bedside. Patient lives at home with father and he will be the caregiver for the patient. Patient's father is active with [employer] for [facility] services and patient would like to use the same company. Contacted Mary with [employer] for the referral. They are able to have a start of service of [place] 9/5, if patient d/c's before then we will need to make other arrangements. Per nurse and patient he will likely not d/c prior to 9/4 as he needs to complete his IV abx prior to d/c.

## 2016-08-23 NOTE — Progress Notes (Signed)
Patient ID: Joshua Moyer, male   DOB: 06-27-1961, 55 y.o.   MRN: [id number]    Referring Physician(s): Gross,S  Supervising Physician: Aletta Edouard  Patient Status:  Inpatient  Chief Complaint:  Appendiceal abscess  Subjective:  Pt feeling better today; has ambulated; has some soreness RLQ but improved; denies N/V; passing gas; brother in room  Allergies: Review of patient's allergies indicates no known allergies.  Medications: Prior to Admission medications   Medication Sig Start Date End Date Taking? Authorizing Provider  Ascorbic Acid (VITAMIN C) 1000 MG tablet Take 1,000 mg by mouth daily.   Yes Historical Provider, MD  b complex vitamins tablet Take 1 tablet by mouth daily.   Yes Historical Provider, MD  dicyclomine (BENTYL) 10 MG capsule TAKE TWO CAPSULES BY MOUTH FOUR TIMES DAILY BEFORE MEALS AND AT BEDTIME  09/17/13  Yes Lafayette Dragon, MD  diltiazem [facility]) 120 MG 24 hr capsule Take 120 mg by mouth daily.  08/20/16  Yes Historical Provider, MD  levothyroxine (SYNTHROID, LEVOTHROID) 25 MCG tablet TAKE ONE TABLET BY MOUTH ONCE A DAY ON AN EMPTY STOMACH 07/14/16  Yes Historical Provider, MD  naproxen sodium (ANAPROX) 220 MG tablet Take 440 mg by mouth 2 (two) times daily as needed (pain).   Yes Historical Provider, MD  NON FORMULARY Kava Kava 500 mg Take one po daily   Yes Historical Provider, MD  NON FORMULARY Hio Joint-Take one po daily   Yes Historical Provider, MD  NON FORMULARY Gaba 500 mg-Take one po daily   Yes Historical Provider, MD  vitamin A 7500 UNIT capsule Take by mouth. Take 500 units daily   Yes Historical Provider, MD  vitamin E 400 UNIT capsule Take 400 Units by mouth daily.   Yes Historical Provider, MD  ciprofloxacin (CIPRO) 250 MG tablet Take one po BID x 5 days Patient not taking: Reported on 08/21/2016 04/28/13   Lafayette Dragon, MD     Vital Signs: BP 125/76   Pulse 72   Temp 98.7 F (37.1 C) (Oral)   Resp 16   Ht 6\' 5"  (1.956 m)   Wt 198 lb  9.6 oz (90.1 kg)   SpO2 97%   BMI 23.55 kg/m   Physical Exam RLQ drain intact, insertion site ok, mildly tender; output 125 cc serosang fluid; cx's pend- gm neg rods  Imaging: Ct Abdomen Pelvis W Contrast  Result Date: 08/21/2016 CLINICAL DATA:  Right lower quadrant pain radiating to the umbilicus EXAM: CT ABDOMEN AND PELVIS WITH CONTRAST TECHNIQUE: Multidetector CT imaging of the abdomen and pelvis was performed using the standard protocol following bolus administration of intravenous contrast. CONTRAST:  142mL ISOVUE-300 IOPAMIDOL (ISOVUE-300) INJECTION 61% COMPARISON:  None. FINDINGS: Lower chest: Incompletely visualized 5 mm pulmonary nodule within the right middle lobe. No pleural effusion. No visible pericardial effusion. Hepatobiliary: Normal hepatic size and contours without focal liver lesion. No perihepatic ascites. No intra- or extrahepatic biliary dilatation. Normal gallbladder. Pancreas: Normal pancreatic contours and enhancement. No peripancreatic fluid collection or pancreatic ductal dilatation. Spleen: Normal. Adrenals/Urinary Tract: Normal adrenal glands. No hydronephrosis or solid renal mass. Stomach/Bowel: The appendix is enlarged, measuring up to 1.1 cm. An appendicolith is seen within the distal appendiceal lumen. There is severe surrounding inflammatory stranding along the course of the appendix and adjacent to the cecum. There is a small fluid collection within the right pericolic gutter measuring 3.1 x 2.2 x 6.0 cm. No extraluminal gas is identified. There is mild peritoneal thickening in the right lower  quadrant. Vascular/Lymphatic: Normal course and caliber of the major abdominal vessels. No abdominal or pelvic adenopathy. Reproductive: Normal prostate and seminal vesicles. Musculoskeletal: Moderate osteoarthrosis of the hips. Multilevel lumbar facet arthrosis and thoracolumbar osteophytosis. No lytic or blastic lesions. The visualized extraperitoneal and extrathoracic soft  tissues are normal. Other: No contributory non-categorized findings. IMPRESSION: 1. Acute appendicitis with severe right lower quadrant inflammatory changes and adjacent fluid collection, likely indicating perforation and abscess formation. 2. 5 mm right middle lobe pulmonary nodule. No follow-up needed if patient is low-risk. Non-contrast chest CT can be considered in 12 months if patient is high-risk. This recommendation follows the consensus statement: Guidelines for Management of Incidental Pulmonary Nodules Detected on CT Images:From the Fleischner Society 2017; published online before print (10.1148/radiol.SG:5268862). These results will be called to the ordering clinician or representative by the Radiologist Assistant, and communication documented in the PACS or zVision Dashboard. Electronically Signed   By: Ulyses Jarred M.D.   On: 08/21/2016 23:24   Ct Image Guided Drainage By Percutaneous Catheter  Result Date: 08/22/2016 CLINICAL DATA:  Appendicitis and periappendiceal abscess EXAM: CT GUIDED DRAINAGE OF RIGHT LOWER QUADRANT PERITONEAL ABSCESS ANESTHESIA/SEDATION: Intravenous Fentanyl and Versed were administered as conscious sedation during continuous monitoring of the patient's level of consciousness and physiological / cardiorespiratory status by the radiology RN, with a total moderate sedation time of 15 minutes. PROCEDURE: The procedure, risks, benefits, and alternatives were explained to the patient and sister. Questions regarding the procedure were encouraged and answered. The patient understands and consents to the procedure. Select axial scans through the lower abdomen were obtained. The right lower quadrant peritoneal loculated collection was localized. An appropriate skin entry site was determined and marked. The operative field was prepped with chlorhexidinein a sterile fashion, and a sterile drape was applied covering the operative field. A sterile gown and sterile gloves were used for  the procedure. Local anesthesia was provided with 1% Lidocaine. Under CT fluoroscopic guidance, a 19 gauge percutaneous entry needle was advanced into the collection. Purulent material could be aspirated. Amplatz guidewire advanced easily within the collection, its position confirmed on CT. Tract was dilated to facilitate placement of a 12 French pigtail catheter, formed within the collection. Approximately 20 mL of purulent material were aspirated, sent for Gram stain, culture and sensitivity. The catheter was secured externally with 0 Prolene suture and StatLock and placed to gravity drain bag. The patient tolerated the procedure well. COMPLICATIONS: None immediate FINDINGS: The loculated right lower quadrant/ paracolic gutter fluid collection was again localized. Aspiration returned purulent material. A 12 French pigtail drain catheter was placed. 20 mL purulent aspirate were sent for Gram stain, culture and sensitivity. IMPRESSION: 1. Technically successful CT-guided right lower quadrant peritoneal abscess drain catheter placement. Electronically Signed   By: Lucrezia Europe M.D.   On: 08/22/2016 16:00    Labs:  CBC:  Recent Labs  08/21/16 2044 08/22/16 0451 08/23/16 0438  WBC 13.2* 10.8* 7.9  HGB 15.6 13.5 13.5  HCT 44.8 38.5* 38.8*  PLT 234 206 221    COAGS:  Recent Labs  08/22/16 1015  INR 1.05    BMP:  Recent Labs  08/21/16 2044 08/22/16 0451 08/23/16 0438  NA 136 137 140  K 4.3 4.0 3.7  CL 102 106 110  CO2 27 25 23   GLUCOSE 92 100* 93  BUN 14 14 7   CALCIUM 9.4 8.5* 8.5*  CREATININE 0.86 0.67 0.64  GFRNONAA >60 >60 >60  GFRAA >60 >60 >60  LIVER FUNCTION TESTS:  Recent Labs  08/21/16 2044  BILITOT 1.8*  AST 19  ALT 20  ALKPHOS 53  PROT 8.0  ALBUMIN 4.1    Assessment and Plan: S/p RLQ/appendiceal abscess drainage 8/31; AF; WBC/creat nl; check final cx's/sens; antbx per pharm; cont drain NS flushes; will need f/u CT/drain injection within 1 week of  placement; if d/c'd home with drain will need to flush once daily with 5 cc sterile NS, record output and change dressing every 1-2 days  Electronically Signed: D. Rowe Robert 08/23/2016, 2:47 PM   I spent a total of 15 minutes  at the the patient's bedside AND on the patient's hospital floor or unit, greater than 50% of which was counseling/coordinating care for abdominal/pelvic abscess drain

## 2016-08-23 NOTE — Progress Notes (Signed)
Subjective: He seems to be doing well, hungry.  Pain comes and goes.  Drainage from IR drain is clear serous now.    Objective: Vital signs in last 24 hours: Temp:  [97.5 F (36.4 C)-98.7 F (37.1 C)] 98.7 F (37.1 C) (09/01 0622) Pulse Rate:  [72-98] 72 (09/01 0622) Resp:  [12-48] 16 (09/01 0622) BP: (104-128)/(63-86) 128/86 (09/01 0622) SpO2:  [93 %-100 %] 97 % (09/01 0622) Last BM Date: 08/22/16 4010 IV 3350 urine 125 from the drain BM x 1 Afebrile, VSS Lab OK K+ 3.7  Intake/Output from previous day: 08/31 0701 - 09/01 0700 In: 4010 [I.V.:2700; IV Piggyback:1300] Out: [id number] [Urine:3350; Drains:125] Intake/Output this shift: No intake/output data recorded.  General appearance: alert, cooperative and no distress Resp: clear to auscultation bilaterally GI: soft, sore, pain comes and goes.  drainage is clear serous fluid. He continues to have some mild tenderness on palpation right lower quadrant. Positive bowel sounds positive BM.  Lab Results:   Recent Labs  08/22/16 0451 08/23/16 0438  WBC 10.8* 7.9  HGB 13.5 13.5  HCT 38.5* 38.8*  PLT 206 221    BMET  Recent Labs  08/22/16 0451 08/23/16 0438  NA 137 140  K 4.0 3.7  CL 106 110  CO2 25 23  GLUCOSE 100* 93  BUN 14 7  CREATININE 0.67 0.64  CALCIUM 8.5* 8.5*   PT/INR  Recent Labs  08/22/16 1015  LABPROT 13.8  INR 1.05     Recent Labs Lab 08/21/16 2044  AST 19  ALT 20  ALKPHOS 53  BILITOT 1.8*  PROT 8.0  ALBUMIN 4.1     Lipase     Component Value Date/Time   LIPASE 19 08/21/2016 2044     Studies/Results: Ct Abdomen Pelvis W Contrast  Result Date: 08/21/2016 CLINICAL DATA:  Right lower quadrant pain radiating to the umbilicus EXAM: CT ABDOMEN AND PELVIS WITH CONTRAST TECHNIQUE: Multidetector CT imaging of the abdomen and pelvis was performed using the standard protocol following bolus administration of intravenous contrast. CONTRAST:  169mL ISOVUE-300 IOPAMIDOL (ISOVUE-300)  INJECTION 61% COMPARISON:  None. FINDINGS: Lower chest: Incompletely visualized 5 mm pulmonary nodule within the right middle lobe. No pleural effusion. No visible pericardial effusion. Hepatobiliary: Normal hepatic size and contours without focal liver lesion. No perihepatic ascites. No intra- or extrahepatic biliary dilatation. Normal gallbladder. Pancreas: Normal pancreatic contours and enhancement. No peripancreatic fluid collection or pancreatic ductal dilatation. Spleen: Normal. Adrenals/Urinary Tract: Normal adrenal glands. No hydronephrosis or solid renal mass. Stomach/Bowel: The appendix is enlarged, measuring up to 1.1 cm. An appendicolith is seen within the distal appendiceal lumen. There is severe surrounding inflammatory stranding along the course of the appendix and adjacent to the cecum. There is a small fluid collection within the right pericolic gutter measuring 3.1 x 2.2 x 6.0 cm. No extraluminal gas is identified. There is mild peritoneal thickening in the right lower quadrant. Vascular/Lymphatic: Normal course and caliber of the major abdominal vessels. No abdominal or pelvic adenopathy. Reproductive: Normal prostate and seminal vesicles. Musculoskeletal: Moderate osteoarthrosis of the hips. Multilevel lumbar facet arthrosis and thoracolumbar osteophytosis. No lytic or blastic lesions. The visualized extraperitoneal and extrathoracic soft tissues are normal. Other: No contributory non-categorized findings. IMPRESSION: 1. Acute appendicitis with severe right lower quadrant inflammatory changes and adjacent fluid collection, likely indicating perforation and abscess formation. 2. 5 mm right middle lobe pulmonary nodule. No follow-up needed if patient is low-risk. Non-contrast chest CT can be considered in 12 months if patient is  high-risk. This recommendation follows the consensus statement: Guidelines for Management of Incidental Pulmonary Nodules Detected on CT Images:From the Fleischner Society  2017; published online before print (10.1148/radiol.SG:5268862). These results will be called to the ordering clinician or representative by the Radiologist Assistant, and communication documented in the PACS or zVision Dashboard. Electronically Signed   By: Ulyses Jarred M.D.   On: 08/21/2016 23:24   Ct Image Guided Drainage By Percutaneous Catheter  Result Date: 08/22/2016 CLINICAL DATA:  Appendicitis and periappendiceal abscess EXAM: CT GUIDED DRAINAGE OF RIGHT LOWER QUADRANT PERITONEAL ABSCESS ANESTHESIA/SEDATION: Intravenous Fentanyl and Versed were administered as conscious sedation during continuous monitoring of the patient's level of consciousness and physiological / cardiorespiratory status by the radiology RN, with a total moderate sedation time of 15 minutes. PROCEDURE: The procedure, risks, benefits, and alternatives were explained to the patient and sister. Questions regarding the procedure were encouraged and answered. The patient understands and consents to the procedure. Select axial scans through the lower abdomen were obtained. The right lower quadrant peritoneal loculated collection was localized. An appropriate skin entry site was determined and marked. The operative field was prepped with chlorhexidinein a sterile fashion, and a sterile drape was applied covering the operative field. A sterile gown and sterile gloves were used for the procedure. Local anesthesia was provided with 1% Lidocaine. Under CT fluoroscopic guidance, a 19 gauge percutaneous entry needle was advanced into the collection. Purulent material could be aspirated. Amplatz guidewire advanced easily within the collection, its position confirmed on CT. Tract was dilated to facilitate placement of a 12 French pigtail catheter, formed within the collection. Approximately 20 mL of purulent material were aspirated, sent for Gram stain, culture and sensitivity. The catheter was secured externally with 0 Prolene suture and StatLock  and placed to gravity drain bag. The patient tolerated the procedure well. COMPLICATIONS: None immediate FINDINGS: The loculated right lower quadrant/ paracolic gutter fluid collection was again localized. Aspiration returned purulent material. A 12 French pigtail drain catheter was placed. 20 mL purulent aspirate were sent for Gram stain, culture and sensitivity. IMPRESSION: 1. Technically successful CT-guided right lower quadrant peritoneal abscess drain catheter placement. Electronically Signed   By: Lucrezia Europe M.D.   On: 08/22/2016 16:00    Medications: . dicyclomine  10 mg Oral TID AC & HS  . diltiazem  120 mg Oral Daily  . famotidine (PEPCID) IV  20 mg Intravenous Q12H  . levothyroxine  25 mcg Oral QAC breakfast  . mouth rinse  15 mL Mouth Rinse BID  . piperacillin-tazobactam (ZOSYN)  IV  3.375 g Intravenous Q8H    Assessment/Plan Acute appendicitis with perforation and abscess IR drain placement 08/22/16 - 20 ml purulent fluid drained Hx of EP atrial flutter ablation procedure Atrial flutter on admit EKG - will ask Cardiology to see Hx of mental retardation Hypothyroid  FEN:  IV fluids/NPO ID: day 2 Zosyn DVT:  SCD - start Lovenox   Plan: Continue IR drain and antibiotics, I'll give him some clear liquids for now. Restart baby aspirin for anticoagulation, Lovenox for DVT anticoagulation.    LOS: 1 day    Joshua Moyer 08/23/2016 [phone]

## 2016-08-24 NOTE — Progress Notes (Signed)
[place] [facility] Office:  [phone] General Surgery Progress Note   LOS: 2 days  POD -     Assessment/Plan: 1.  Appendiceal abscess   Perc drain - 08/22/2016  WBC - 7,900 - 08/23/2016  Zosyn - 8/31 >>>  Diet full liquid - to advance diet  2.  Developmental delay 3.  History of A. Fib  Cardiology suggested just aspirin for anticoagulation  4.  DVT prophylaxis - Lovenox   Principal Problem:   Acute appendicitis with peritoneal abscess Active Problems:   Atrial flutter (HCC)   Cognitive developmental delay   Atrial fibrillation - chronic, rate-contreolled  Subjective:  Doing okay.  Sister, [name], and father in room.  [name] is traveling Music therapist, lives in [place].  He lives with his father, who just had valvular surgery by Dr. Burt Knack 2 weeks ago.  He has [facility] coming by the hous.  Objective:   Vitals:   08/23/16 2045 08/24/16 0530  BP: 124/82 127/85  Pulse: 94 83  Resp: 18 18  Temp: 97.8 F (36.6 C) 97.9 F (36.6 C)     Intake/Output from previous day:  09/01 0701 - 09/02 0700 In: 2907.5 [P.O.:1080; I.V.:1612.5; IV Piggyback:200] Out: 1600 [Urine:1600]  Intake/Output this shift:  Total I/O In: 240 [P.O.:240] Out: -    Physical Exam:   General: WN WM who is alert and oriented.  Eyes non  congruant.   HEENT: Normal. Pupils equal. .   Lungs: Clear   Abdomen: soft.  BS present   Wound: Drain from RLQ - bloody.  Essentially no output.   Lab Results:    Recent Labs  08/22/16 0451 08/23/16 0438  WBC 10.8* 7.9  HGB 13.5 13.5  HCT 38.5* 38.8*  PLT 206 221    BMET   Recent Labs  08/22/16 0451 08/23/16 0438  NA 137 140  K 4.0 3.7  CL 106 110  CO2 25 23  GLUCOSE 100* 93  BUN 14 7  CREATININE 0.67 0.64  CALCIUM 8.5* 8.5*    PT/INR   Recent Labs  08/22/16 1015  LABPROT 13.8  INR 1.05    ABG  No results for input(s): PHART, HCO3 in the last 72 hours.  Invalid input(s): PCO2, PO2   Studies/Results:  Ct Image Guided  Drainage By Percutaneous Catheter  Result Date: 08/22/2016 CLINICAL DATA:  Appendicitis and periappendiceal abscess EXAM: CT GUIDED DRAINAGE OF RIGHT LOWER QUADRANT PERITONEAL ABSCESS ANESTHESIA/SEDATION: Intravenous Fentanyl and Versed were administered as conscious sedation during continuous monitoring of the patient's level of consciousness and physiological / cardiorespiratory status by the radiology RN, with a total moderate sedation time of 15 minutes. PROCEDURE: The procedure, risks, benefits, and alternatives were explained to the patient and sister. Questions regarding the procedure were encouraged and answered. The patient understands and consents to the procedure. Select axial scans through the lower abdomen were obtained. The right lower quadrant peritoneal loculated collection was localized. An appropriate skin entry site was determined and marked. The operative field was prepped with chlorhexidinein a sterile fashion, and a sterile drape was applied covering the operative field. A sterile gown and sterile gloves were used for the procedure. Local anesthesia was provided with 1% Lidocaine. Under CT fluoroscopic guidance, a 19 gauge percutaneous entry needle was advanced into the collection. Purulent material could be aspirated. Amplatz guidewire advanced easily within the collection, its position confirmed on CT. Tract was dilated to facilitate placement of a 12 French pigtail catheter, formed within the collection. Approximately 20  mL of purulent material were aspirated, sent for Gram stain, culture and sensitivity. The catheter was secured externally with 0 Prolene suture and StatLock and placed to gravity drain bag. The patient tolerated the procedure well. COMPLICATIONS: None immediate FINDINGS: The loculated right lower quadrant/ paracolic gutter fluid collection was again localized. Aspiration returned purulent material. A 12 French pigtail drain catheter was placed. 20 mL purulent aspirate were  sent for Gram stain, culture and sensitivity. IMPRESSION: 1. Technically successful CT-guided right lower quadrant peritoneal abscess drain catheter placement. Electronically Signed   By: Lucrezia Europe M.D.   On: 08/22/2016 16:00     Anti-infectives:   Anti-infectives    Start     Dose/Rate Route Frequency Ordered Stop   08/22/16 0600  piperacillin-tazobactam (ZOSYN) IVPB 3.375 g     3.375 g 12.5 mL/hr over 240 Minutes Intravenous Every 8 hours 08/22/16 0114     08/21/16 2330  piperacillin-tazobactam (ZOSYN) IVPB 3.375 g     3.375 g 100 mL/hr over 30 Minutes Intravenous  Once 08/21/16 2329 08/22/16 0039      Alphonsa Overall, MD, FACS Pager: [place] [facility] Office: [phone] 08/24/2016

## 2016-08-25 LAB — CBC WITH DIFFERENTIAL/PLATELET
BASOS PCT: 1 %
Basophils Absolute: 0 10*3/uL (ref 0.0–0.1)
Eosinophils Absolute: 0.2 10*3/uL (ref 0.0–0.7)
Eosinophils Relative: 3 %
HEMATOCRIT: 35.5 % — AB (ref 39.0–52.0)
Hemoglobin: 12.6 g/dL — ABNORMAL LOW (ref 13.0–17.0)
LYMPHS ABS: 1.9 10*3/uL (ref 0.7–4.0)
LYMPHS PCT: 37 %
MCH: 34 pg (ref 26.0–34.0)
MCHC: 35.5 g/dL (ref 30.0–36.0)
MCV: 95.7 fL (ref 78.0–100.0)
MONO ABS: 0.7 10*3/uL (ref 0.1–1.0)
MONOS PCT: 14 %
NEUTROS ABS: 2.4 10*3/uL (ref 1.7–7.7)
Neutrophils Relative %: 45 %
Platelets: 257 10*3/uL (ref 150–400)
RBC: 3.71 MIL/uL — ABNORMAL LOW (ref 4.22–5.81)
RDW: 12.7 % (ref 11.5–15.5)
WBC: 5.1 10*3/uL (ref 4.0–10.5)

## 2016-08-25 NOTE — Progress Notes (Signed)
Per family's request taught family how to flush drain per orders. Family able to demonstrate procedure correctly.

## 2016-08-25 NOTE — Care Management Note (Signed)
Case Management Note  Patient Details  Name: Joshua Moyer MRN: [id number] Date of Birth: [birth year]  Subjective/Objective:      Appendiceal abscess               Action/Plan: Discharge Planning:  NCM spoke to sister, Joshua Moyer # [id number] and father, Joshua Moyer. Plan is dc to home with [facility] RN, family does not want SNF. Pt's father will train on drain care at home. Pt's sister is a Therapist, sports, and will be in there on weekends to assist. Pt brother will be here next week to assist with drain care. Explained if pt dc on 08/26/2016, the [facility] RN will come out within 24-48 hours. Contacted [facility] to make aware a soc is needed for 08/27/2016.  Left message for attending for [facility] RN orders/F2F needed with instruction on how to care for drain.   PCP- Hulan Fess MD   Expected Discharge Date:  08/26/2016              Expected Discharge Plan:  [place]  In-House Referral:  NA  Discharge planning Services  CM Consult  Post Acute Care Choice:  Home Health Choice offered to:  Patient, Sibling  DME Arranged:  N/A DME Agency:  NA  HH Arranged:  RN [place] Agency:  [facility] (now [name])  Status of Service:  In process, will continue to follow  If discussed at Long Length of Stay Meetings, dates discussed:    Additional Comments:  Erenest Rasher, RN 08/25/2016, 4:49 PM

## 2016-08-25 NOTE — Progress Notes (Signed)
Pt's sister and father had concerns about pt's discharge and wanted to speak with the doctor.  Dr. Lucia Gaskins called and he spoke with pt's sister regarding home health and possible c-diff.  New orders written and pt placed on precautions.  Case manager also notified and came to bedside to discuss home health with pt and his family.

## 2016-08-25 NOTE — Progress Notes (Addendum)
Joshua Moyer:  [phone] General Surgery Progress Note   LOS: 3 days  POD -     Assessment/Plan: 1.  Appendiceal abscess   Perc drain - 08/22/2016  WBC - 5,100 - 08/25/2016  Zosyn - 8/31 >>>  On regular diet.  Doing well. [facility] set up for follow up. Probably home tomorrow AM.   [Addendum:  Family now considering nursing facility, but they are somewhat unsure.  If HHC can come by daily, they will try home with [facility] -  I spoke on phone to sister, [name], at 2:30PM]  2.  Developmental delay 3.  History of A. Fib  Cardiology suggested just aspirin for anticoagulation  4.  DVT prophylaxis - Lovenox 5.  Loose stools  He has had loose stools for 3 days - has no abdominal pain and WBC is normal, but sister worried about C. Diff - will check this.   Principal Problem:   Acute appendicitis with peritoneal abscess Active Problems:   Atrial flutter (HCC)   Cognitive developmental delay   Atrial fibrillation - chronic, rate-contreolled  Subjective:  Doing okay.  Sister, [name], and father in room.  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  He lives with his father, who just had valvular surgery by Dr. Burt Knack 2 weeks ago.  He has [facility] coming by the house.  Objective:   Vitals:   08/24/16 2140 08/25/16 0630  BP: 123/78 118/79  Pulse: 71 89  Resp: 18 18  Temp: 98 F (36.7 C) 97.7 F (36.5 C)     Intake/Output from previous day:  09/02 0701 - 09/03 0700 In: 2655 [P.O.:1440; I.V.:1200] Out: 1225 [Urine:1150; Drains:75]  Intake/Output this shift:  Total I/O In: -  Out: 800 [Urine:800]   Physical Exam:   General: WN WM who is alert and oriented.  Eyes non  congruant.   HEENT: Normal. Pupils equal. .   Lungs: Clear   Abdomen: soft.  BS present   Wound: Drain from RLQ -75 cc last 24 hours.   Lab Results:     Recent Labs  08/23/16 0438 08/25/16 0516  WBC 7.9 5.1  HGB 13.5 12.6*  HCT 38.8* 35.5*  PLT 221 257    BMET    Recent  Labs  08/23/16 0438  NA 140  K 3.7  CL 110  CO2 23  GLUCOSE 93  BUN 7  CREATININE 0.64  CALCIUM 8.5*    PT/INR    Recent Labs  08/22/16 1015  LABPROT 13.8  INR 1.05    ABG  No results for input(s): PHART, HCO3 in the last 72 hours.  Invalid input(s): PCO2, PO2   Studies/Results:  No results found.   Anti-infectives:   Anti-infectives    Start     Dose/Rate Route Frequency Ordered Stop   08/22/16 0600  piperacillin-tazobactam (ZOSYN) IVPB 3.375 g     3.375 g 12.5 mL/hr over 240 Minutes Intravenous Every 8 hours 08/22/16 0114     08/21/16 2330  piperacillin-tazobactam (ZOSYN) IVPB 3.375 g     3.375 g 100 mL/hr over 30 Minutes Intravenous  Once 08/21/16 2329 08/22/16 0039      Alphonsa Overall, MD, FACS Pager: [facility] Moyer: [phone] 08/25/2016

## 2016-08-26 MED ORDER — AMOXICILLIN-POT CLAVULANATE 875-125 MG PO TABS: 1.0000 | ORAL_TABLET | Freq: Two times a day (BID) | ORAL | 0 refills | Status: DC

## 2016-08-26 MED ORDER — ACETAMINOPHEN 325 MG PO TABS: 650.0000 mg | ORAL_TABLET | Freq: Four times a day (QID) | ORAL | 0 refills | Status: DC | PRN

## 2016-08-26 NOTE — Progress Notes (Signed)
Discharge instructions discussed with patient and family, verbalized agreement and understanding 

## 2016-08-26 NOTE — Progress Notes (Signed)
Date: August 26, 2016 Discharge orders checked for needs. No needs present at time of discharge. Joandy Burget, RN, BSN, CCM   [phone] 

## 2016-08-26 NOTE — Progress Notes (Signed)
Pt has had two formed BMs during the night. Lab wont accept formed stool for cdiff sample. Pt informed.

## 2016-08-26 NOTE — Progress Notes (Signed)
Referring Physician(s): Clyda Greener  Supervising Physician: Corrie Mckusick  Patient Status:  Inpatient  Chief Complaint:  Peri-appendiceal abscess S/P drain by Dr. Vernard Gambles on 08/22/2016  Subjective:  Joshua Moyer is doing OK. He tells me he is supposed to go home today.  Also he was walking around the room with his gravity bag dangling and not secured to his leg.  I secured it to his leg with the Velcro strap and explained the importance of keeping it secure so it doesn't get pulled out.   He understood.  Allergies: Review of patient's allergies indicates no known allergies.  Medications: Prior to Admission medications   Medication Sig Start Date End Date Taking? Authorizing Provider  Ascorbic Acid (VITAMIN C) 1000 MG tablet Take 1,000 mg by mouth daily.   Yes Historical Provider, MD  b complex vitamins tablet Take 1 tablet by mouth daily.   Yes Historical Provider, MD  dicyclomine (BENTYL) 10 MG capsule TAKE TWO CAPSULES BY MOUTH FOUR TIMES DAILY BEFORE MEALS AND AT BEDTIME  09/17/13  Yes Lafayette Dragon, MD  diltiazem [facility]) 120 MG 24 hr capsule Take 120 mg by mouth daily.  08/20/16  Yes Historical Provider, MD  levothyroxine (SYNTHROID, LEVOTHROID) 25 MCG tablet TAKE ONE TABLET BY MOUTH ONCE A DAY ON AN EMPTY STOMACH 07/14/16  Yes Historical Provider, MD  naproxen sodium (ANAPROX) 220 MG tablet Take 440 mg by mouth 2 (two) times daily as needed (pain).   Yes Historical Provider, MD  NON FORMULARY Kava Kava 500 mg Take one po daily   Yes Historical Provider, MD  NON FORMULARY Hio Joint-Take one po daily   Yes Historical Provider, MD  NON FORMULARY Gaba 500 mg-Take one po daily   Yes Historical Provider, MD  vitamin A 7500 UNIT capsule Take by mouth. Take 500 units daily   Yes Historical Provider, MD  vitamin E 400 UNIT capsule Take 400 Units by mouth daily.   Yes Historical Provider, MD  acetaminophen (TYLENOL) 325 MG tablet Take 2 tablets (650 mg total) by mouth every 6 (six)  hours as needed for fever, headache, mild pain or moderate pain. 08/26/16   Stark Klein, MD  amoxicillin-clavulanate (AUGMENTIN) 875-125 MG tablet Take 1 tablet by mouth 2 (two) times daily. 08/26/16   Stark Klein, MD  ciprofloxacin (CIPRO) 250 MG tablet Take one po BID x 5 days Patient not taking: Reported on 08/21/2016 04/28/13   Lafayette Dragon, MD     Vital Signs: BP 127/87 (BP Location: Left Arm)   Pulse 61   Temp 97.8 F (36.6 C) (Oral)   Resp 18   Ht 6\' 5"  (1.956 m)   Wt 198 lb 9.6 oz (90.1 kg)   SpO2 98%   BMI 23.55 kg/m   Physical Exam  Awake and Alert NAD Walking around in room Drain in place. Looks good ~70 ml output recorded. Serous blood tinged fluid in bag. Flushes easily.  Imaging: Ct Image Guided Drainage By Percutaneous Catheter  Result Date: 08/22/2016 CLINICAL DATA:  Appendicitis and periappendiceal abscess EXAM: CT GUIDED DRAINAGE OF RIGHT LOWER QUADRANT PERITONEAL ABSCESS ANESTHESIA/SEDATION: Intravenous Fentanyl and Versed were administered as conscious sedation during continuous monitoring of the patient's level of consciousness and physiological / cardiorespiratory status by the radiology RN, with a total moderate sedation time of 15 minutes. PROCEDURE: The procedure, risks, benefits, and alternatives were explained to the patient and sister. Questions regarding the procedure were encouraged and answered. The patient understands and consents to  the procedure. Select axial scans through the lower abdomen were obtained. The right lower quadrant peritoneal loculated collection was localized. An appropriate skin entry site was determined and marked. The operative field was prepped with chlorhexidinein a sterile fashion, and a sterile drape was applied covering the operative field. A sterile gown and sterile gloves were used for the procedure. Local anesthesia was provided with 1% Lidocaine. Under CT fluoroscopic guidance, a 19 gauge percutaneous entry needle was advanced  into the collection. Purulent material could be aspirated. Amplatz guidewire advanced easily within the collection, its position confirmed on CT. Tract was dilated to facilitate placement of a 12 French pigtail catheter, formed within the collection. Approximately 20 mL of purulent material were aspirated, sent for Gram stain, culture and sensitivity. The catheter was secured externally with 0 Prolene suture and StatLock and placed to gravity drain bag. The patient tolerated the procedure well. COMPLICATIONS: None immediate FINDINGS: The loculated right lower quadrant/ paracolic gutter fluid collection was again localized. Aspiration returned purulent material. A 12 French pigtail drain catheter was placed. 20 mL purulent aspirate were sent for Gram stain, culture and sensitivity. IMPRESSION: 1. Technically successful CT-guided right lower quadrant peritoneal abscess drain catheter placement. Electronically Signed   By: Lucrezia Europe M.D.   On: 08/22/2016 16:00    Labs:  CBC:  Recent Labs  08/21/16 2044 08/22/16 0451 08/23/16 0438 08/25/16 0516  WBC 13.2* 10.8* 7.9 5.1  HGB 15.6 13.5 13.5 12.6*  HCT 44.8 38.5* 38.8* 35.5*  PLT 234 206 221 257    COAGS:  Recent Labs  08/22/16 1015  INR 1.05    BMP:  Recent Labs  08/21/16 2044 08/22/16 0451 08/23/16 0438  NA 136 137 140  K 4.3 4.0 3.7  CL 102 106 110  CO2 27 25 23   GLUCOSE 92 100* 93  BUN 14 14 7   CALCIUM 9.4 8.5* 8.5*  CREATININE 0.86 0.67 0.64  GFRNONAA >60 >60 >60  GFRAA >60 >60 >60    LIVER FUNCTION TESTS:  Recent Labs  08/21/16 2044  BILITOT 1.8*  AST 19  ALT 20  ALKPHOS 53  PROT 8.0  ALBUMIN 4.1    Assessment and Plan:  Peri-appendiceal abscess  S/P drain by Dr. Vernard Gambles on 08/22/2016  Continue gentle flushes  Order for Clinic F/U appointment with [place] is in place.   Electronically Signed: Murrell Redden PA-C 08/26/2016, 10:35 AM   I spent a total of 15 Minutes at the the patient's bedside AND on  the patient's hospital floor or unit, greater than 50% of which was counseling/coordinating care for f/u drain.

## 2016-08-26 NOTE — Progress Notes (Signed)
Patient ID: Joshua Moyer, male   DOB: 04/11/1961, 55 y.o.   MRN: [id number] [facility] Office:  [phone] General Surgery Progress Note   LOS: 4 days  POD -     Assessment/Plan: 1.  Appendiceal abscess   Perc drain - 08/22/2016  WBC - 5,100 - 08/25/2016  Zosyn - 8/31 >>>  On regular diet.  Doing well. [facility] set up for follow up. Probably home today.   2.  Developmental delay 3.  History of A. Fib  Cardiology suggested just aspirin for anticoagulation  4.  DVT prophylaxis - Lovenox    Principal Problem:   Acute appendicitis with peritoneal abscess Active Problems:   Atrial flutter (HCC)   Cognitive developmental delay   Atrial fibrillation - chronic, rate-contreolled  Subjective:  Doing okay.  Sister, [name], and father in room.  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  He lives with his father, who just had valvular repair by Dr. Burt Knack 2 weeks ago.  He has [facility] coming by the house.  Objective:   Vitals:   08/25/16 2045 08/26/16 0501  BP: 131/83 127/87  Pulse: 83 61  Resp: 18 18  Temp: 98.3 F (36.8 C) 97.8 F (36.6 C)     Intake/Output from previous day:  09/03 0701 - 09/04 0700 In: 2000 [P.O.:720; I.V.:1200; IV Piggyback:50] Out: [id number] [Urine:4150]  Intake/Output this shift:  Total I/O In: 120 [P.O.:120] Out: 1000 [Urine:1000]   Physical Exam:   General: WN WM who is alert and oriented.  Eyes non  congruant.   HEENT: Normal. Pupils equal. .   Lungs: Clear   Abdomen: soft.  BS present   Wound: Drain from RLQ -75 cc last 24 hours.   Lab Results:     Recent Labs  08/25/16 0516  WBC 5.1  HGB 12.6*  HCT 35.5*  PLT 257    BMET   No results for input(s): NA, K, CL, CO2, GLUCOSE, BUN, CREATININE, CALCIUM in the last 72 hours.  PT/INR   No results for input(s): LABPROT, INR in the last 72 hours.  ABG  No results for input(s): PHART, HCO3 in the last 72 hours.  Invalid input(s): PCO2,  PO2   Studies/Results:  No results found.   Anti-infectives:   Anti-infectives    Start     Dose/Rate Route Frequency Ordered Stop   08/22/16 0600  piperacillin-tazobactam (ZOSYN) IVPB 3.375 g     3.375 g 12.5 mL/hr over 240 Minutes Intravenous Every 8 hours 08/22/16 0114     08/21/16 2330  piperacillin-tazobactam (ZOSYN) IVPB 3.375 g     3.375 g 100 mL/hr over 30 Minutes Intravenous  Once 08/21/16 2329 08/22/16 [place] [facility] Office: [phone] 08/26/2016

## 2016-08-26 NOTE — Discharge Instructions (Signed)
Percutaneous Abscess Drain, Care After °Refer to this sheet in the next few weeks. These instructions provide you with information on caring for yourself after your procedure. Your health care provider may also give you more specific instructions. Your treatment has been planned according to current medical practices, but problems sometimes occur. Call your health care provider if you have any problems or questions after your procedure. °WHAT TO EXPECT AFTER THE PROCEDURE °After your procedure, it is typical to have the following:  °· A small amount of discomfort in the area where the drainage tube was placed. °· A small amount of bruising around the area where the drainage tube was placed. °· Sleepiness and fatigue for the rest of the day from the medicines used. °HOME CARE INSTRUCTIONS °· Rest at home for 1-2 days following your procedure or as directed by your health care provider. °· If you go home right after the procedure, plan to have someone with you for 24 hours. °· Do not take a bath or shower for 24 hours after your procedure. °· Take medicines only as directed by your health care provider. Ask your health care provider when you can resume taking any normal medicines. °· Change bandages (dressings) as directed.   °· You may be told to record the amount of drainage from the bag every time you empty it. Follow your health care provider's directions for emptying the bag. Write down the amount of drainage, the date, and the time you emptied it. °· Call your health care provider when the drain is putting out less than 10 mL of drainage per day for 2-3 days in a row or as directed by your health care provider. °· Follow your health care provider's instructions for cleaning the drainage tube. You may need to clean the tube every day so that it does not clog. °SEEK MEDICAL CARE IF: °· You have increased bleeding (more than a small spot) from the site where the drainage tube was placed. °· You have redness,  swelling, or increasing pain around the site where the drainage tube was placed. °· You notice a discharge or bad smell coming from the site where the drainage tube was placed. °· You have a fever or chills.  °· You have pain that is not helped by medicine.   °SEEK IMMEDIATE MEDICAL CARE IF: °· There is leakage around the drainage tube. °· The drainage tube pulls out. °· You suddenly stop having drainage from the tube. °· You suddenly have blood in the drainage fluid. °· You become dizzy or faint. °· You develop a rash.   °· You have nausea or vomiting. °· You have difficulty breathing, feel short of breath, or feel faint.   °· You develop chest pain. °· You have problems with your speech or vision. °· You have trouble balancing or moving your arms or legs. °  °This information is not intended to replace advice given to you by your health care provider. Make sure you discuss any questions you have with your health care provider. °  °Document Released: 04/25/2014 Document Revised: 09/27/2014 Document Reviewed: 04/25/2014 °Elsevier Interactive Patient Education ©2016 Elsevier Inc. ° °

## 2016-08-27 NOTE — Discharge Summary (Signed)
Physician Discharge Summary  Patient ID: Joshua Moyer MRN: [id number] DOB/AGE: [birth year] 55 y.o.  Admit date: 08/21/2016 Discharge date: 08/26/2016  Admission Diagnoses: * Acute appendicitis with perforation and abscess IR drain placement 08/22/16 - 20 ml purulent fluid drained Hx of EP atrial flutter ablation procedure Atrial flutter on admit EKG Hx of mental retardation Hypothyroid  Developmental delay   Discharge Diagnoses:  Same  Principal Problem:   Acute appendicitis with peritoneal abscess Active Problems:   Atrial flutter (HCC)   Cognitive developmental delay   Atrial fibrillation - chronic, rate-contreolled   PROCEDURES: IR drain placement 08/22/16   Hospital Course:  The patient is a 55 year old white male who presents to the emergency department with abdominal pain that started on Monday. The pain was across his lower abdomen and has now moved to the right lower quadrant. He denies any nausea or vomiting. He denies any fevers or chills. He came to the emergency department where a CT scan shows evidence of a contained perforation of the appendix.  He was seen and admitted by Dr. Marlou Moyer.  He was seen and had a drain placed the following AM.  He did well with the drain and IV antibiotics.  His diet was slowly advanced and by 9/4 he was on a soft diet.  We converted him to oral antibiotics and he has follow up in the drain clinic in about 2 weeks, they will call with time for next CT.  He will follow up with Dr. Johney Moyer in about 2 weeks.    CBC Latest Ref Rng & Units 08/25/2016 08/23/2016 08/22/2016  WBC 4.0 - 10.5 K/uL 5.1 7.9 10.8(H)  Hemoglobin 13.0 - 17.0 g/dL 12.6(L) 13.5 13.5  Hematocrit 39.0 - 52.0 % 35.5(L) 38.8(L) 38.5(L)  Platelets 150 - 400 K/uL 257 221 206   CMP Latest Ref Rng & Units 08/23/2016 08/22/2016 08/21/2016  Glucose 65 - 99 mg/dL 93 100(H) 92  BUN 6 - 20 mg/dL 7 14 14   Creatinine 0.61 - 1.24 mg/dL 0.64 0.67 0.86  Sodium 135 - 145 mmol/L 140 137 136  Potassium  3.5 - 5.1 mmol/L 3.7 4.0 4.3  Chloride 101 - 111 mmol/L 110 106 102  CO2 22 - 32 mmol/L 23 25 27   Calcium 8.9 - 10.3 mg/dL 8.5(L) 8.5(L) 9.4  Total Protein 6.5 - 8.1 g/dL - - 8.0  Total Bilirubin 0.3 - 1.2 mg/dL - - 1.8(H)  Alkaline Phos 38 - 126 U/L - - 53  AST 15 - 41 U/L - - 19  ALT 17 - 63 U/L - - 20    Condition on d/c:  Improving    Disposition: 01-Home or Self Care  Discharge Instructions    Call MD for:  persistant nausea and vomiting    Complete by:  As directed   Call MD for:  redness, tenderness, or signs of infection (pain, swelling, redness, odor or green/yellow discharge around incision site)    Complete by:  As directed   Call MD for:  severe uncontrolled pain    Complete by:  As directed   Call MD for:  temperature >100.4    Complete by:  As directed   Change dressing (specify)    Complete by:  As directed   Measure and record drain output twice daily.  Flush drain twice daily.  Home health.   Diet - low sodium heart healthy    Complete by:  As directed   Increase activity slowly    Complete by:  As  directed       Medication List    STOP taking these medications   ciprofloxacin 250 MG tablet Commonly known as:  CIPRO     TAKE these medications   acetaminophen 325 MG tablet Commonly known as:  TYLENOL Take 2 tablets (650 mg total) by mouth every 6 (six) hours as needed for fever, headache, mild pain or moderate pain.   amoxicillin-clavulanate 875-125 MG tablet Commonly known as:  AUGMENTIN Take 1 tablet by mouth 2 (two) times daily.   b complex vitamins tablet Take 1 tablet by mouth daily.   dicyclomine 10 MG capsule Commonly known as:  BENTYL TAKE TWO CAPSULES BY MOUTH FOUR TIMES DAILY BEFORE MEALS AND AT BEDTIME   diltiazem 120 MG 24 hr capsule Commonly known as:  TIAZAC Take 120 mg by mouth daily.   levothyroxine 25 MCG tablet Commonly known as:  SYNTHROID, LEVOTHROID TAKE ONE TABLET BY MOUTH ONCE A DAY ON AN EMPTY STOMACH   naproxen  sodium 220 MG tablet Commonly known as:  ANAPROX Take 440 mg by mouth 2 (two) times daily as needed (pain).   NON FORMULARY Kava Kava 500 mg Take one po daily   NON FORMULARY Hio Joint-Take one po daily   NON FORMULARY Gaba 500 mg-Take one po daily   vitamin A 7500 UNIT capsule Take by mouth. Take 500 units daily   vitamin C 1000 MG tablet Take 1,000 mg by mouth daily.   vitamin E 400 UNIT capsule Take 400 Units by mouth daily.      Follow-up Information    [facility] .   Why:  drain care Contact information: [address] [place] [address] [phone]        Adin Hector., MD Follow up in 2 week(s).   Specialty:  General Surgery Contact information: [address] [place] [address] [phone]           Signed: Earnstine Moyer 08/27/2016, 4:12 PM

## 2016-08-28 DIAGNOSIS — F79 Unspecified intellectual disabilities: Secondary | ICD-10-CM | POA: Diagnosis not present

## 2016-08-28 DIAGNOSIS — Z7982 Long term (current) use of aspirin: Secondary | ICD-10-CM | POA: Diagnosis not present

## 2016-08-28 DIAGNOSIS — K353 Acute appendicitis with localized peritonitis: Secondary | ICD-10-CM | POA: Diagnosis not present

## 2016-08-28 DIAGNOSIS — I4891 Unspecified atrial fibrillation: Secondary | ICD-10-CM | POA: Diagnosis not present

## 2016-08-28 LAB — AEROBIC/ANAEROBIC CULTURE W GRAM STAIN (SURGICAL/DEEP WOUND)

## 2016-08-29 ENCOUNTER — Other Ambulatory Visit: Payer: Self-pay | Admitting: Surgery

## 2016-08-29 DIAGNOSIS — K3533 Acute appendicitis with perforation and localized peritonitis, with abscess: Secondary | ICD-10-CM

## 2016-09-03 DIAGNOSIS — K352 Acute appendicitis with generalized peritonitis: Secondary | ICD-10-CM | POA: Diagnosis not present

## 2016-09-10 ENCOUNTER — Other Ambulatory Visit: Payer: Medicare Other

## 2016-09-10 ENCOUNTER — Ambulatory Visit
Admission: RE | Admit: 2016-09-10 | Discharge: 2016-09-10 | Disposition: A | Discharge status: Home / Self Care | Payer: Medicare Other | Source: Ambulatory Visit | Attending: Radiology | Admitting: Radiology

## 2016-09-10 ENCOUNTER — Ambulatory Visit
Admission: RE | Admit: 2016-09-10 | Discharge: 2016-09-10 | Disposition: A | Discharge status: Home / Self Care | Payer: Medicare Other | Source: Ambulatory Visit | Attending: Surgery | Admitting: Surgery

## 2016-09-10 DIAGNOSIS — K5790 Diverticulosis of intestine, part unspecified, without perforation or abscess without bleeding: Secondary | ICD-10-CM | POA: Diagnosis not present

## 2016-09-10 DIAGNOSIS — K3533 Acute appendicitis with perforation and localized peritonitis, with abscess: Secondary | ICD-10-CM

## 2016-09-10 DIAGNOSIS — K353 Acute appendicitis with localized peritonitis: Secondary | ICD-10-CM | POA: Diagnosis not present

## 2016-09-10 HISTORY — PX: IR GENERIC HISTORICAL: IMG1180011

## 2016-09-10 MED ORDER — IOPAMIDOL (ISOVUE-300) INJECTION 61%
100.0000 mL | Freq: Once | INTRAVENOUS | Status: AC | PRN
  Administered 2016-09-10: 100 mL via INTRAVENOUS

## 2016-09-10 NOTE — Progress Notes (Signed)
Interventional Radiology Progress Note  55 yo male with a history of ruptured appendicitis and abscess, with CT guided drainage 08/22/2016.    He returns for evaluation at [facility] with his father.   He has had scant output into the gravity drain.  He denies any fevers rigors or chills.    CT shows [place] that there is resolution of the abscess, and there is no persisting collection on drain injection, and no fistula.  The drain has been partially withdrawn, and the most proximal side-holes seem to be outside of the peritoneal space on the CT.   Majority of the injected contrast exited through the skin.    The drain was removed at the bedside, with dry dressing applied.   He was instructed to observe his upcoming appointment with the surgery clinic.  He understands.    Signed,  Dulcy Fanny. Earleen Newport, DO

## 2016-10-01 ENCOUNTER — Ambulatory Visit: Payer: Self-pay | Admitting: General Surgery

## 2016-10-01 DIAGNOSIS — K352 Acute appendicitis with generalized peritonitis: Secondary | ICD-10-CM | POA: Diagnosis not present

## 2016-10-29 ENCOUNTER — Encounter (HOSPITAL_COMMUNITY)
Admission: RE | Admit: 2016-10-29 | Discharge: 2016-10-29 | Disposition: A | Discharge status: Home / Self Care | Payer: Medicare Other | Source: Ambulatory Visit | Attending: General Surgery | Admitting: General Surgery

## 2016-10-29 ENCOUNTER — Encounter (HOSPITAL_COMMUNITY): Payer: Self-pay

## 2016-10-29 DIAGNOSIS — I4891 Unspecified atrial fibrillation: Secondary | ICD-10-CM | POA: Insufficient documentation

## 2016-10-29 DIAGNOSIS — K353 Acute appendicitis with localized peritonitis: Secondary | ICD-10-CM | POA: Insufficient documentation

## 2016-10-29 DIAGNOSIS — Z01812 Encounter for preprocedural laboratory examination: Secondary | ICD-10-CM | POA: Diagnosis not present

## 2016-10-29 DIAGNOSIS — F819 Developmental disorder of scholastic skills, unspecified: Secondary | ICD-10-CM | POA: Diagnosis not present

## 2016-10-29 HISTORY — DX: Cardiac arrhythmia, unspecified: I49.9

## 2016-10-29 HISTORY — DX: Unspecified atrial fibrillation: I48.91

## 2016-10-29 HISTORY — DX: Gastro-esophageal reflux disease without esophagitis: K21.9

## 2016-10-29 HISTORY — DX: Hypothyroidism, unspecified: E03.9

## 2016-10-29 LAB — CBC
HCT: 44.9 % (ref 39.0–52.0)
Hemoglobin: 15.9 g/dL (ref 13.0–17.0)
MCH: 34.1 pg — ABNORMAL HIGH (ref 26.0–34.0)
MCHC: 35.4 g/dL (ref 30.0–36.0)
MCV: 96.4 fL (ref 78.0–100.0)
PLATELETS: 218 10*3/uL (ref 150–400)
RBC: 4.66 MIL/uL (ref 4.22–5.81)
RDW: 13 % (ref 11.5–15.5)
WBC: 3.8 10*3/uL — ABNORMAL LOW (ref 4.0–10.5)

## 2016-10-29 LAB — BASIC METABOLIC PANEL
Anion gap: 8 (ref 5–15)
BUN: 12 mg/dL (ref 6–20)
CALCIUM: 9.5 mg/dL (ref 8.9–10.3)
CO2: 26 mmol/L (ref 22–32)
CREATININE: 0.78 mg/dL (ref 0.61–1.24)
Chloride: 105 mmol/L (ref 101–111)
GFR calc non Af Amer: >60 mL/min (ref >60)
Glucose, Bld: 86 mg/dL (ref 65–99)
Potassium: 4 mmol/L (ref 3.5–5.1)
SODIUM: 139 mmol/L (ref 135–145)

## 2016-10-29 NOTE — Pre-Procedure Instructions (Addendum)
    Dyllin Lichtenstein  10/29/2016      [id number] [address], [place] [address] [place] [address] Phone: [phone] Fax: [phone]  [facility] [address] [place] [address], [place] [id number] [place] [id number] [place] [address] Phone: [phone] Fax: [phone]    Your procedure is scheduled on Mon. Nov. 13  Report to [facility] Admitting at 8:00 A.M.  Call this number if you have problems the morning of surgery:  [phone]   Remember:  Do not eat food or drink liquids after midnight on sun. Nov. 12  Take these medicines the morning of surgery with A SIP OF WATER : tylenol if needed, diltiazem (tiazac), levothyroxine (synthroid)             Stop advil, motrin, ibuprofen, aleve, BC Powders, Goody's, vitamins/herbal medicines.             Continue taking baby aspirin. Do not take it the day of surgery.   Do not wear jewelry.  Do not wear lotions, powders, or cologne, or deoderant.  Do not shave 48 hours prior to surgery.  Men may shave face and neck.  Do not bring valuables to the hospital.  [facility] is not responsible for any belongings or valuables.  Contacts, dentures or bridgework may not be worn into surgery.  Leave your suitcase in the car.  After surgery it may be brought to your room.  For patients admitted to the hospital, discharge time will be determined by your treatment team.   Special instructions:  Review preparing for surgery  Please read over the following fact sheets that you were given. Coughing and Deep Breathing

## 2016-10-29 NOTE — Progress Notes (Signed)
PCP: Dr. Hulan Fess @ [facility]  No cardiologist.  Called Dr. Ethlyn Gallery office for clarification when to stop baby aspirin. Nurse stated to continue the baby aspirin but not to take day of surgery. Instructions given to pt and father.

## 2016-10-30 ENCOUNTER — Encounter (HOSPITAL_COMMUNITY): Payer: Self-pay

## 2016-10-30 NOTE — Progress Notes (Signed)
Anesthesia Chart Review:  Pt is a 55 year old male scheduled for laparoscopic appendectomy on 11/04/2016 with Jovita Kussmaul, MD.  - PCP is Hulan Fess, MD  PMH includes:  Atrial flutter, atrial fibrillation, cognitive developmental delay, hypothyroidism, GERD.  Never smoker. BMI 23.5  Medications include: ASA, diltiazem, levothyroxine  Preoperative labs reviewed.    EKG 08/22/16: atrial fibrillation (96 bpm)  Pt used to see Tollie Eth, MD with cardiology for atrial flutter but was lost to follow up several years ago.  Consult note by Dr. Wynonia Lawman 08/22/16 when pt in hospital for ruptured appendix indicates pt in afib with controlled rate. No further cardiac work up necessary. CHA2DS2VASC score is 0, maintained on ASA for anticoagulation.   If no changes, I anticipate pt can proceed with surgery as scheduled.   Willeen Cass, FNP-BC [facility]/Anesthesiology Phone: [phone] 10/30/2016 1:54 PM

## 2016-11-04 ENCOUNTER — Ambulatory Visit (HOSPITAL_COMMUNITY): Payer: Medicare Other | Admitting: Certified Registered Nurse Anesthetist

## 2016-11-04 ENCOUNTER — Encounter (HOSPITAL_COMMUNITY)
Admission: RE | Disposition: A | Discharge status: Home / Self Care | Payer: Self-pay | Source: Ambulatory Visit | Attending: General Surgery

## 2016-11-04 ENCOUNTER — Encounter (HOSPITAL_COMMUNITY): Payer: Self-pay | Admitting: *Deleted

## 2016-11-04 ENCOUNTER — Ambulatory Visit (HOSPITAL_COMMUNITY): Payer: Medicare Other | Admitting: Vascular Surgery

## 2016-11-04 ENCOUNTER — Ambulatory Visit (HOSPITAL_COMMUNITY)
Admission: RE | Admit: 2016-11-04 | Discharge: 2016-11-05 | Disposition: A | Discharge status: Home / Self Care | Payer: Medicare Other | Source: Ambulatory Visit | Attending: General Surgery | Admitting: General Surgery

## 2016-11-04 DIAGNOSIS — K352 Acute appendicitis with generalized peritonitis: Secondary | ICD-10-CM | POA: Diagnosis not present

## 2016-11-04 DIAGNOSIS — K219 Gastro-esophageal reflux disease without esophagitis: Secondary | ICD-10-CM | POA: Diagnosis not present

## 2016-11-04 DIAGNOSIS — E039 Hypothyroidism, unspecified: Secondary | ICD-10-CM | POA: Diagnosis not present

## 2016-11-04 DIAGNOSIS — K353 Acute appendicitis with localized peritonitis: Secondary | ICD-10-CM | POA: Diagnosis not present

## 2016-11-04 DIAGNOSIS — K37 Unspecified appendicitis: Secondary | ICD-10-CM | POA: Diagnosis not present

## 2016-11-04 DIAGNOSIS — I482 Chronic atrial fibrillation: Secondary | ICD-10-CM | POA: Diagnosis not present

## 2016-11-04 DIAGNOSIS — Z888 Allergy status to other drugs, medicaments and biological substances status: Secondary | ICD-10-CM | POA: Insufficient documentation

## 2016-11-04 DIAGNOSIS — I4892 Unspecified atrial flutter: Secondary | ICD-10-CM | POA: Diagnosis not present

## 2016-11-04 DIAGNOSIS — I4891 Unspecified atrial fibrillation: Secondary | ICD-10-CM | POA: Diagnosis not present

## 2016-11-04 DIAGNOSIS — Z7982 Long term (current) use of aspirin: Secondary | ICD-10-CM | POA: Diagnosis not present

## 2016-11-04 HISTORY — PX: LAPAROSCOPIC APPENDECTOMY: SHX408

## 2016-11-04 SURGERY — APPENDECTOMY, LAPAROSCOPIC
Anesthesia: General

## 2016-11-04 MED ORDER — LACTATED RINGERS IV SOLN
INTRAVENOUS | Status: DC
  Administered 2016-11-04 (×2): via INTRAVENOUS

## 2016-11-04 MED ORDER — ROCURONIUM BROMIDE 10 MG/ML (PF) SYRINGE
PREFILLED_SYRINGE | INTRAVENOUS | Status: AC
  Filled 2016-11-04: qty 20

## 2016-11-04 MED ORDER — ONDANSETRON HCL 4 MG/2ML IJ SOLN
INTRAMUSCULAR | Status: AC
  Filled 2016-11-04: qty 4

## 2016-11-04 MED ORDER — B COMPLEX-C PO TABS
1.0000 | ORAL_TABLET | Freq: Every day | ORAL | Status: DC
  Administered 2016-11-05: 1 via ORAL
  Filled 2016-11-04: qty 1

## 2016-11-04 MED ORDER — HYDROMORPHONE HCL 1 MG/ML IJ SOLN
0.2500 mg | INTRAMUSCULAR | Status: DC | PRN
  Administered 2016-11-04 (×2): 0.5 mg via INTRAVENOUS

## 2016-11-04 MED ORDER — FAMOTIDINE IN NACL 20-0.9 MG/50ML-% IV SOLN
20.0000 mg | Freq: Two times a day (BID) | INTRAVENOUS | Status: DC
  Administered 2016-11-04 – 2016-11-05 (×3): 20 mg via INTRAVENOUS
  Filled 2016-11-04 (×4): qty 50

## 2016-11-04 MED ORDER — ROCURONIUM BROMIDE 100 MG/10ML IV SOLN
INTRAVENOUS | Status: DC | PRN
  Administered 2016-11-04: 50 mg via INTRAVENOUS
  Administered 2016-11-04: 20 mg via INTRAVENOUS

## 2016-11-04 MED ORDER — DEXAMETHASONE SODIUM PHOSPHATE 10 MG/ML IJ SOLN
INTRAMUSCULAR | Status: AC
  Filled 2016-11-04: qty 1

## 2016-11-04 MED ORDER — PHENYLEPHRINE 40 MCG/ML (10ML) SYRINGE FOR IV PUSH (FOR BLOOD PRESSURE SUPPORT)
PREFILLED_SYRINGE | INTRAVENOUS | Status: AC
  Filled 2016-11-04: qty 20

## 2016-11-04 MED ORDER — MIDAZOLAM HCL 5 MG/5ML IJ SOLN
INTRAMUSCULAR | Status: DC | PRN
  Administered 2016-11-04: 2 mg via INTRAVENOUS

## 2016-11-04 MED ORDER — ASPIRIN EC 81 MG PO TBEC
81.0000 mg | DELAYED_RELEASE_TABLET | Freq: Every day | ORAL | Status: DC
  Administered 2016-11-05: 81 mg via ORAL
  Filled 2016-11-04 (×2): qty 1

## 2016-11-04 MED ORDER — FENTANYL CITRATE (PF) 100 MCG/2ML IJ SOLN
INTRAMUSCULAR | Status: AC
  Filled 2016-11-04: qty 2

## 2016-11-04 MED ORDER — EPHEDRINE 5 MG/ML INJ
INTRAVENOUS | Status: AC
  Filled 2016-11-04: qty 10

## 2016-11-04 MED ORDER — KCL IN DEXTROSE-NACL 20-5-0.9 MEQ/L-%-% IV SOLN
INTRAVENOUS | Status: DC
  Administered 2016-11-04: 17:00:00 via INTRAVENOUS
  Filled 2016-11-04 (×2): qty 1000

## 2016-11-04 MED ORDER — BUPIVACAINE HCL (PF) 0.25 % IJ SOLN
INTRAMUSCULAR | Status: DC | PRN
  Administered 2016-11-04: 15 mL

## 2016-11-04 MED ORDER — BUPIVACAINE HCL (PF) 0.25 % IJ SOLN
INTRAMUSCULAR | Status: AC
  Filled 2016-11-04: qty 30

## 2016-11-04 MED ORDER — LEVOTHYROXINE SODIUM 25 MCG PO TABS
25.0000 ug | ORAL_TABLET | Freq: Every day | ORAL | Status: DC
  Administered 2016-11-05: 25 ug via ORAL
  Filled 2016-11-04: qty 1

## 2016-11-04 MED ORDER — MORPHINE SULFATE (PF) 2 MG/ML IV SOLN: 1.0000 mg | INTRAVENOUS | Status: DC | PRN

## 2016-11-04 MED ORDER — HYDROMORPHONE HCL 1 MG/ML IJ SOLN
INTRAMUSCULAR | Status: AC
  Administered 2016-11-04: 0.5 mg via INTRAVENOUS
  Filled 2016-11-04: qty 1

## 2016-11-04 MED ORDER — EPHEDRINE SULFATE 50 MG/ML IJ SOLN
INTRAMUSCULAR | Status: DC | PRN
  Administered 2016-11-04: 5 mg via INTRAVENOUS

## 2016-11-04 MED ORDER — FENTANYL CITRATE (PF) 100 MCG/2ML IJ SOLN
INTRAMUSCULAR | Status: DC | PRN
  Administered 2016-11-04 (×2): 50 ug via INTRAVENOUS
  Administered 2016-11-04: 100 ug via INTRAVENOUS

## 2016-11-04 MED ORDER — B COMPLEX-C PO TABS
ORAL_TABLET | Freq: Every day | ORAL | Status: DC
  Filled 2016-11-04: qty 1

## 2016-11-04 MED ORDER — MIDAZOLAM HCL 2 MG/2ML IJ SOLN
INTRAMUSCULAR | Status: AC
  Filled 2016-11-04: qty 2

## 2016-11-04 MED ORDER — ONDANSETRON HCL 4 MG/2ML IJ SOLN: 4.0000 mg | Freq: Four times a day (QID) | INTRAMUSCULAR | Status: DC | PRN

## 2016-11-04 MED ORDER — VITAMIN C 500 MG PO TABS
500.0000 mg | ORAL_TABLET | Freq: Every day | ORAL | Status: DC
  Administered 2016-11-05: 500 mg via ORAL
  Filled 2016-11-04 (×2): qty 1

## 2016-11-04 MED ORDER — PROPOFOL 10 MG/ML IV BOLUS
INTRAVENOUS | Status: AC
  Filled 2016-11-04: qty 20

## 2016-11-04 MED ORDER — PROPOFOL 10 MG/ML IV BOLUS
INTRAVENOUS | Status: DC | PRN
  Administered 2016-11-04: 130 mg via INTRAVENOUS

## 2016-11-04 MED ORDER — ONDANSETRON HCL 4 MG/2ML IJ SOLN
INTRAMUSCULAR | Status: DC | PRN
  Administered 2016-11-04: 4 mg via INTRAVENOUS

## 2016-11-04 MED ORDER — SUGAMMADEX SODIUM 200 MG/2ML IV SOLN
INTRAVENOUS | Status: DC | PRN
  Administered 2016-11-04: 200 mg via INTRAVENOUS

## 2016-11-04 MED ORDER — DILTIAZEM HCL ER COATED BEADS 120 MG PO CP24
120.0000 mg | ORAL_CAPSULE | Freq: Every day | ORAL | Status: DC
  Administered 2016-11-05: 120 mg via ORAL
  Filled 2016-11-04: qty 1

## 2016-11-04 MED ORDER — VITAMIN C 500 MG PO TABS: 1000.0000 mg | ORAL_TABLET | Freq: Every day | ORAL | Status: DC

## 2016-11-04 MED ORDER — 0.9 % SODIUM CHLORIDE (POUR BTL) OPTIME
TOPICAL | Status: DC | PRN
  Administered 2016-11-04: 1000 mL

## 2016-11-04 MED ORDER — LIDOCAINE 2% (20 MG/ML) 5 ML SYRINGE
INTRAMUSCULAR | Status: AC
  Filled 2016-11-04: qty 10

## 2016-11-04 MED ORDER — HYDROCODONE-ACETAMINOPHEN 5-325 MG PO TABS
1.0000 | ORAL_TABLET | ORAL | Status: DC | PRN
  Administered 2016-11-04 – 2016-11-05 (×2): 2 via ORAL
  Filled 2016-11-04 (×2): qty 2

## 2016-11-04 MED ORDER — HYDROCODONE-ACETAMINOPHEN 5-325 MG PO TABS: 1.0000 | ORAL_TABLET | ORAL | 0 refills | Status: DC | PRN

## 2016-11-04 MED ORDER — VITAMIN D 1000 UNITS PO TABS
1000.0000 [IU] | ORAL_TABLET | Freq: Every day | ORAL | Status: DC
  Administered 2016-11-04 – 2016-11-05 (×2): 1000 [IU] via ORAL
  Filled 2016-11-04 (×2): qty 1

## 2016-11-04 MED ORDER — CEFOTETAN DISODIUM-DEXTROSE 2-2.08 GM-% IV SOLR
2.0000 g | INTRAVENOUS | Status: AC
  Administered 2016-11-04: 2 g via INTRAVENOUS
  Filled 2016-11-04: qty 50

## 2016-11-04 MED ORDER — SODIUM CHLORIDE 0.9 % IR SOLN
Status: DC | PRN
  Administered 2016-11-04: 1000 mL

## 2016-11-04 MED ORDER — SUGAMMADEX SODIUM 200 MG/2ML IV SOLN
INTRAVENOUS | Status: AC
  Filled 2016-11-04: qty 2

## 2016-11-04 MED ORDER — PHENYLEPHRINE HCL 10 MG/ML IJ SOLN
INTRAMUSCULAR | Status: DC | PRN
  Administered 2016-11-04: 80 ug via INTRAVENOUS
  Administered 2016-11-04: 120 ug via INTRAVENOUS
  Administered 2016-11-04: 80 ug via INTRAVENOUS
  Administered 2016-11-04: 120 ug via INTRAVENOUS

## 2016-11-04 MED ORDER — LIDOCAINE HCL (CARDIAC) 20 MG/ML IV SOLN
INTRAVENOUS | Status: DC | PRN
  Administered 2016-11-04: 60 mg via INTRAVENOUS

## 2016-11-04 MED ORDER — CHLORHEXIDINE GLUCONATE CLOTH 2 % EX PADS: 6.0000 | MEDICATED_PAD | Freq: Once | CUTANEOUS | Status: DC

## 2016-11-04 MED ORDER — HEPARIN SODIUM (PORCINE) 5000 UNIT/ML IJ SOLN
5000.0000 [IU] | Freq: Three times a day (TID) | INTRAMUSCULAR | Status: DC
  Administered 2016-11-05: 5000 [IU] via SUBCUTANEOUS
  Filled 2016-11-04: qty 1

## 2016-11-04 MED ORDER — ONDANSETRON 4 MG PO TBDP: 4.0000 mg | ORAL_TABLET | Freq: Four times a day (QID) | ORAL | Status: DC | PRN

## 2016-11-04 MED ORDER — DIPHENHYDRAMINE HCL 50 MG/ML IJ SOLN: 25.0000 mg | Freq: Four times a day (QID) | INTRAMUSCULAR | Status: DC | PRN

## 2016-11-04 SURGICAL SUPPLY — 40 items
ADH SKN CLS APL DERMABOND .7 (GAUZE/BANDAGES/DRESSINGS) ×1
APPLIER CLIP ROT 10 11.4 M/L (STAPLE)
APR CLP MED LRG 11.4X10 (STAPLE)
BAG SPEC RTRVL LRG 6X4 10 (ENDOMECHANICALS) ×1
BLADE SURG ROTATE 9660 (MISCELLANEOUS) ×1 IMPLANT
CANISTER SUCTION 2500CC (MISCELLANEOUS) ×2 IMPLANT
CHLORAPREP W/TINT 26ML (MISCELLANEOUS) ×2 IMPLANT
CLIP APPLIE ROT 10 11.4 M/L (STAPLE) IMPLANT
COVER SURGICAL LIGHT HANDLE (MISCELLANEOUS) ×2 IMPLANT
CUTTER FLEX LINEAR 45M (STAPLE) ×2 IMPLANT
DERMABOND ADVANCED (GAUZE/BANDAGES/DRESSINGS) ×1
DERMABOND ADVANCED .7 DNX12 (GAUZE/BANDAGES/DRESSINGS) ×1 IMPLANT
ELECT REM PT RETURN 9FT ADLT (ELECTROSURGICAL) ×2
ELECTRODE REM PT RTRN 9FT ADLT (ELECTROSURGICAL) ×1 IMPLANT
ENDOLOOP SUT PDS II  0 18 (SUTURE)
ENDOLOOP SUT PDS II 0 18 (SUTURE) IMPLANT
GLOVE BIO SURGEON STRL SZ7.5 (GLOVE) ×2 IMPLANT
GLOVE BIOGEL PI IND STRL 7.5 (GLOVE) IMPLANT
GLOVE BIOGEL PI INDICATOR 7.5 (GLOVE) ×1
GLOVE ECLIPSE 7.5 STRL STRAW (GLOVE) ×1 IMPLANT
GOWN STRL REUS W/ TWL LRG LVL3 (GOWN DISPOSABLE) ×3 IMPLANT
GOWN STRL REUS W/TWL LRG LVL3 (GOWN DISPOSABLE) ×6
KIT BASIN OR (CUSTOM PROCEDURE TRAY) ×2 IMPLANT
KIT ROOM TURNOVER OR (KITS) ×2 IMPLANT
NS IRRIG 1000ML POUR BTL (IV SOLUTION) ×2 IMPLANT
PAD ARMBOARD 7.5X6 YLW CONV (MISCELLANEOUS) ×4 IMPLANT
POUCH SPECIMEN RETRIEVAL 10MM (ENDOMECHANICALS) ×2 IMPLANT
RELOAD STAPLE 45 3.5 BLU ETS (ENDOMECHANICALS) ×1 IMPLANT
RELOAD STAPLE TA45 3.5 REG BLU (ENDOMECHANICALS) ×2 IMPLANT
SCALPEL HARMONIC ACE (MISCELLANEOUS) ×2 IMPLANT
SCISSORS LAP 5X35 DISP (ENDOMECHANICALS) ×1 IMPLANT
SET IRRIG TUBING LAPAROSCOPIC (IRRIGATION / IRRIGATOR) ×2 IMPLANT
SPECIMEN JAR SMALL (MISCELLANEOUS) ×2 IMPLANT
SUT MNCRL AB 4-0 PS2 18 (SUTURE) ×2 IMPLANT
TOWEL OR 17X24 6PK STRL BLUE (TOWEL DISPOSABLE) ×2 IMPLANT
TRAY FOLEY CATH SILVER 16FR (SET/KITS/TRAYS/PACK) ×1 IMPLANT
TRAY LAPAROSCOPIC MC (CUSTOM PROCEDURE TRAY) ×2 IMPLANT
TROCAR XCEL BLUNT TIP 100MML (ENDOMECHANICALS) ×2 IMPLANT
TROCAR XCEL NON-BLD 5MMX100MML (ENDOMECHANICALS) ×4 IMPLANT
TUBING INSUFFLATION (TUBING) ×2 IMPLANT

## 2016-11-04 NOTE — Transfer of Care (Signed)
Immediate Anesthesia Transfer of Care Note  Patient: Joshua Moyer  Procedure(s) Performed: Procedure(s): APPENDECTOMY LAPAROSCOPIC (N/A)  Patient Location: PACU  Anesthesia Type:General  Level of Consciousness: awake, alert , oriented and patient cooperative  Airway & Oxygen Therapy: Patient Spontanous Breathing and Patient connected to nasal cannula oxygen  Post-op Assessment: Report given to RN, Post -op Vital signs reviewed and stable and Patient moving all extremities X 4  Post vital signs: Reviewed and stable  Last Vitals:  Vitals:   11/04/16 0825 11/04/16 1140  BP: 103/71   Pulse: 66   Resp: 18   Temp: 36.7 C (P) 36.3 C    Last Pain:  Vitals:   11/04/16 0825  TempSrc: Oral      Patients Stated Pain Goal: 5 ([id number])  Complications: No apparent anesthesia complications

## 2016-11-04 NOTE — Interval H&P Note (Signed)
History and Physical Interval Note:  11/04/2016 10:03 AM  Fenton Foy  has presented today for surgery, with the diagnosis of appendicitis  The various methods of treatment have been discussed with the patient and family. After consideration of risks, benefits and other options for treatment, the patient has consented to  Procedure(s): APPENDECTOMY LAPAROSCOPIC (N/A) as a surgical intervention .  The patient's history has been reviewed, patient examined, no change in status, stable for surgery.  I have reviewed the patient's chart and labs.  Questions were answered to the patient's satisfaction.     TOTH III,PAUL S

## 2016-11-04 NOTE — Anesthesia Postprocedure Evaluation (Signed)
Anesthesia Post Note  Patient: Joshua Moyer  Procedure(s) Performed: Procedure(s) (LRB): APPENDECTOMY LAPAROSCOPIC (N/A)  Patient location during evaluation: PACU Anesthesia Type: General Level of consciousness: awake and alert Pain management: pain level controlled Vital Signs Assessment: post-procedure vital signs reviewed and stable Respiratory status: spontaneous breathing, nonlabored ventilation, respiratory function stable and patient connected to nasal cannula oxygen Cardiovascular status: blood pressure returned to baseline and stable Postop Assessment: no signs of nausea or vomiting Anesthetic complications: no    Last Vitals:  Vitals:   11/04/16 1150 11/04/16 1155  BP:  114/84  Pulse: 82 77  Resp: 20 16  Temp:      Last Pain:  Vitals:   11/04/16 1200  TempSrc:   PainSc: 7                  Marcus Schwandt,W. EDMOND

## 2016-11-04 NOTE — Op Note (Signed)
11/04/2016  11:16 AM  PATIENT:  Joshua Moyer  55 y.o. male  PRE-OPERATIVE DIAGNOSIS:  History of perforated appendicitis  POST-OPERATIVE DIAGNOSIS:  History of perforated appendicitis  PROCEDURE:  Procedure(s): APPENDECTOMY LAPAROSCOPIC (N/A) - Interval  SURGEON:  Surgeon(s) and Role:    * Jovita Kussmaul, MD - Primary  PHYSICIAN ASSISTANT:   ASSISTANTS: Judyann Munson, RNFA   ANESTHESIA:   general  EBL:  Total I/O In: 1000 [I.V.:1000] Out: -   BLOOD ADMINISTERED:none  DRAINS: none   LOCAL MEDICATIONS USED:  MARCAINE     SPECIMEN:  Source of Specimen:  appendix  DISPOSITION OF SPECIMEN:  PATHOLOGY  COUNTS:  YES  TOURNIQUET:  * No tourniquets in log *  DICTATION: .Dragon Dictation   After informed consent was obtained patient was brought to the operating room placed in the supine position on the operating room table. After adequate induction of general anesthesia the patient's abdomen was prepped with ChloraPrep, allowed to dry, and draped in usual sterile manner. An appropriate timeout was performed. The area below the umbilicus was infiltrated with quarter percent Marcaine. A small incision was made with a 15 blade knife. This incision was carried down through the subcutaneous tissue bluntly with a hemostat and [employer] retractors until the linea alba was identified. The linea alba was incised with a 15 blade knife. Each side was grasped Coker clamps and elevated anteriorly. The preperitoneal space was probed bluntly with a hemostat until the peritoneum was opened and access was gained to the abdominal cavity. A 0 Vicryl purse string stitch was placed in the fascia surrounding the opening. A Hassan cannula was placed through the opening and anchored in place with the previously placed Vicryl purse string stitch. The laparoscope was placed through the [facility] cannula. The abdomen was insufflated with carbon dioxide without difficulty. Next the suprapubic area was  infiltrated with quarter percent Marcaine. A small incision was made with a 15 blade knife. A 5 mm port was placed bluntly through this incision into the abdominal cavity. A site was then chosen between the 2 port for placement of a 5 mm port. The area was infiltrated with quarter percent Marcaine. A small stab incision was made with a 15 blade knife. A 5 mm port was placed bluntly through this incision and the abdominal cavity under direct vision. The laparoscope was then moved to the suprapubic port. Using a Glassman grasper and harmonic scalpel the right lower quadrant was inspected. There were some omental adhesions to the abdominal wall in the RLQ but they were taken down sharply with the harmonic scalpel easily. The appendix was readily identified. The appendix was elevated anteriorly and the mesoappendix was taken down sharply with the harmonic scalpel. Once the base of the appendix where it joined the cecum was identified and cleared of any tissue then a laparoscopic GIA blue load 6 row stapler was placed through the [facility] cannula. The stapler was placed across the base of the appendix clamped and fired thereby dividing the base of the appendix between staple lines. A laparoscopic bag was then inserted through the [facility] cannula. The appendix was placed within the bag and the bag was sealed. The abdomen was then irrigated with copious amounts of saline until the effluent was clear. No other abnormalities were noted. The appendix and bag were removed with the [facility] cannula through the infraumbilical port without difficulty. The fascial defect was closed with the previously placed Vicryl pursestring stitch as well as with another interrupted 0 Vicryl  figure-of-eight stitch. The rest of the ports were removed under direct vision and were found to be hemostatic. The gas was allowed to escape. The skin incisions were closed with interrupted 4-0 Monocryl subcuticular stitches. Dermabond dressings were applied.  The patient tolerated the procedure well. At the end of the case all needle sponge and instrument counts were correct. The patient was then awakened and taken to recovery in stable condition.  PLAN OF CARE: Admit for overnight observation  PATIENT DISPOSITION:  PACU - hemodynamically stable.   Delay start of Pharmacological VTE agent (>24hrs) due to surgical blood loss or risk of bleeding: no

## 2016-11-04 NOTE — Anesthesia Preprocedure Evaluation (Addendum)
Anesthesia Evaluation  Patient identified by MRN, date of birth, ID band Patient awake    Reviewed: Allergy & Precautions, H&P , NPO status , Patient's Chart, lab work & pertinent test results  Airway Mallampati: II  TM Distance: >3 FB Neck ROM: Full    Dental no notable dental hx. (+) Teeth Intact, Dental Advisory Given   Pulmonary neg pulmonary ROS,    Pulmonary exam normal breath sounds clear to auscultation       Cardiovascular Normal cardiovascular exam+ dysrhythmias Atrial Fibrillation  Rhythm:Irregular Rate:Normal     Neuro/Psych negative neurological ROS  negative psych ROS   GI/Hepatic Neg liver ROS, GERD  Controlled,  Endo/Other  Hypothyroidism   Renal/GU negative Renal ROS  negative genitourinary   Musculoskeletal   Abdominal Normal abdominal exam  (+)   Peds  Hematology negative hematology ROS (+)   Anesthesia Other Findings   Reproductive/Obstetrics negative OB ROS                            Anesthesia Physical Anesthesia Plan  ASA: III  Anesthesia Plan: General   Post-op Pain Management:    Induction: Intravenous  Airway Management Planned: Oral ETT  Additional Equipment:   Intra-op Plan:   Post-operative Plan: Extubation in OR  Informed Consent: I have reviewed the patients History and Physical, chart, labs and discussed the procedure including the risks, benefits and alternatives for the proposed anesthesia with the patient or authorized representative who has indicated his/her understanding and acceptance.   Dental advisory given  Plan Discussed with: CRNA  Anesthesia Plan Comments:         Anesthesia Quick Evaluation

## 2016-11-04 NOTE — Anesthesia Procedure Notes (Signed)
Procedure Name: Intubation Date/Time: 11/04/2016 10:21 AM Performed by: Shirlyn Goltz Pre-anesthesia Checklist: Patient identified, Emergency Drugs available, Suction available and Patient being monitored Patient Re-evaluated:Patient Re-evaluated prior to inductionOxygen Delivery Method: Circle system utilized Preoxygenation: Pre-oxygenation with 100% oxygen Intubation Type: IV induction Ventilation: Mask ventilation without difficulty Laryngoscope Size: Mac and 4 Grade View: Grade I Tube type: Oral Tube size: 7.5 mm Number of attempts: 1 Airway Equipment and Method: Stylet Placement Confirmation: ETT inserted through vocal cords under direct vision,  positive ETCO2 and breath sounds checked- equal and bilateral Secured at: 23 cm Tube secured with: Tape Dental Injury: Teeth and Oropharynx as per pre-operative assessment

## 2016-11-04 NOTE — H&P (Signed)
Joshua Moyer. [facility]  Location: [facility] Patient #: [id number] DOB: August 19, 1961 Single / Language: Joshua Moyer / Race: White Male   History of Present Illness  The patient is a 55 year old male who presents for a follow-up for Abdominal pain. The patient is a 55 year old white male who was hospitalized about 6 weeks ago with appendicitis and evidence of contained perforation. He was treated with a percutaneous drain and antibiotics. He had significant improvement. Since his last visit he had a repeat CT scan that showed resolution of the abscess cavity. The drain was removed. He has been well since that time. He is still taking Augmentin.   Allergies  No Known Drug Allergies  Medication History DilTIAZem HCl ER Beads (120MG  Capsule ER 24HR, Oral) Active. Levothyroxine Sodium (25MCG Tablet, Oral) Active. Vitamin A (Oral) Specific dose unknown - Active. Vitamin E (400UNIT Tablet, Oral) Active. Medications Reconciled    Review of Systems  General Not Present- Appetite Loss, Chills, Fatigue, Fever, Night Sweats, Weight Gain and Weight Loss. Skin Not Present- Change in Wart/Mole, Dryness, Hives, Jaundice, New Lesions, Non-Healing Wounds, Rash and Ulcer. HEENT Not Present- Earache, Hearing Loss, Hoarseness, Nose Bleed, Oral Ulcers, Ringing in the Ears, Seasonal Allergies, Sinus Pain, Sore Throat, Visual Disturbances, Wears glasses/contact lenses and Yellow Eyes. Respiratory Not Present- Bloody sputum, Chronic Cough, Difficulty Breathing, Snoring and Wheezing. Breast Not Present- Breast Mass, Breast Pain, Nipple Discharge and Skin Changes. Cardiovascular Not Present- Chest Pain, Difficulty Breathing Lying Down, Leg Cramps, Palpitations, Rapid Heart Rate, Shortness of Breath and Swelling of Extremities. Gastrointestinal Not Present- Abdominal Pain, Bloating, Bloody Stool, Change in Bowel Habits, Chronic diarrhea, Constipation, Difficulty Swallowing, Excessive gas, Gets full  quickly at meals, Hemorrhoids, Indigestion, Nausea, Rectal Pain and Vomiting. Male Genitourinary Not Present- Blood in Urine, Change in Urinary Stream, Frequency, Impotence, Nocturia, Painful Urination, Urgency and Urine Leakage. Musculoskeletal Not Present- Back Pain, Joint Pain, Joint Stiffness, Muscle Pain, Muscle Weakness and Swelling of Extremities. Neurological Not Present- Decreased Memory, Fainting, Headaches, Numbness, Seizures, Tingling, Tremor, Trouble walking and Weakness. Psychiatric Not Present- Anxiety, Bipolar, Change in Sleep Pattern, Depression, Fearful and Frequent crying. Endocrine Not Present- Cold Intolerance, Excessive Hunger, Hair Changes, Heat Intolerance and New Diabetes. Hematology Not Present- Easy Bruising, Excessive bleeding, Gland problems, HIV and Persistent Infections.  Vitals Weight: 196 lb Height: 77in Body Surface Area: 2.22 m Body Mass Index: 23.24 kg/m  Temp.: 97.46F(Temporal)  Pulse: 79 (Regular)  BP: 124/80 (Sitting, Left Arm, Standard)       Physical Exam General Mental Status-Alert. General Appearance-Consistent with stated age. Hydration-Well hydrated. Voice-Normal.  Head and Neck Head-normocephalic, atraumatic with no lesions or palpable masses. Trachea-midline. Thyroid Gland Characteristics - normal size and consistency.  Eye Eyeball - Bilateral-Extraocular movements intact. Sclera/Conjunctiva - Bilateral-No scleral icterus.  Chest and Lung Exam Chest and lung exam reveals -quiet, even and easy respiratory effort with no use of accessory muscles and on auscultation, normal breath sounds, no adventitious sounds and normal vocal resonance. Inspection Chest Wall - Normal. Back - normal.  Cardiovascular Cardiovascular examination reveals -normal heart sounds, regular rate and rhythm with no murmurs and normal pedal pulses bilaterally.  Abdomen Note: The abdomen is soft and nontender. There is no  palpable mass.   Neurologic Neurologic evaluation reveals -alert and oriented x 3 with no impairment of recent or remote memory. Mental Status-Normal.  Musculoskeletal Normal Exam - Left-Upper Extremity Strength Normal and Lower Extremity Strength Normal. Normal Exam - Right-Upper Extremity Strength Normal and Lower Extremity Strength Normal.  Lymphatic Head & Neck  General Head & Neck Lymphatics: Bilateral - Description - Normal. Axillary  General Axillary Region: Bilateral - Description - Normal. Tenderness - Non Tender. Femoral & Inguinal  Generalized Femoral & Inguinal Lymphatics: Bilateral - Description - Normal. Tenderness - Non Tender.    Assessment & Plan  APPENDICITIS WITH PERFORATION (K35.2) Impression: The patient is about 6 weeks status post percutaneous drainage of a appendicitis with abscess. Since his last appointment the drain has been removed. He has tolerated that well. He is now far enough out that we could plan to schedule an interval appendectomy. I have discussed with him in detail the risks and benefits of the operation as well as some of the technical aspects and he understands and wishes to proceed. He will remain on Augmentin until the surgery Current Plans Started Amoxicillin-Pot Clavulanate 875-125MG , 1 (one) Tablet two times daily, #30, 10/01/2016, Ref. x1.

## 2016-11-05 ENCOUNTER — Encounter (HOSPITAL_COMMUNITY): Payer: Self-pay | Admitting: General Surgery

## 2016-11-05 DIAGNOSIS — K352 Acute appendicitis with generalized peritonitis: Secondary | ICD-10-CM | POA: Diagnosis not present

## 2016-11-05 NOTE — Progress Notes (Signed)
Discharge home. Home discharge instruction given, no question verbaized. 

## 2016-11-05 NOTE — Care Management Obs Status (Signed)
[facility] NOTIFICATION   Patient Details  Name: Joshua Moyer MRN: [id number] Date of Birth: 21-Jan-1961   Medicare Observation Status Notification Given:  Yes (Medicare obs procedure)    Marilu Favre, RN 11/05/2016, 10:00 AM

## 2016-11-05 NOTE — Progress Notes (Signed)
1 Day Post-Op  Subjective: No complaints  Objective: Vital signs in last 24 hours: Temp:  [97.4 F (36.3 C)-98.3 F (36.8 C)] 97.4 F (36.3 C) (11/14 0500) Pulse Rate:  [84-96] 86 (11/14 0500) BP: (115-119)/(68-77) 119/69 (11/14 0500) SpO2:  [96 %-99 %] 96 % (11/14 0500) Last BM Date: 11/03/16  Intake/Output from previous day: 11/13 0701 - 11/14 0700 In: 2214.2 [P.O.:360; I.V.:1804.2; IV Piggyback:50] Out: 25 [Blood:25] Intake/Output this shift: Total I/O In: 240 [P.O.:240] Out: -   Resp: clear to auscultation bilaterally Cardio: regular rate and rhythm GI: soft, minimal tenderness  Lab Results:  No results for input(s): WBC, HGB, HCT, PLT in the last 72 hours. BMET No results for input(s): NA, K, CL, CO2, GLUCOSE, BUN, CREATININE, CALCIUM in the last 72 hours. PT/INR No results for input(s): LABPROT, INR in the last 72 hours. ABG No results for input(s): PHART, HCO3 in the last 72 hours.  Invalid input(s): PCO2, PO2  Studies/Results: No results found.  Anti-infectives: Anti-infectives    Start     Dose/Rate Route Frequency Ordered Stop   11/04/16 0830  cefoTEtan in Dextrose 5% (CEFOTAN) IVPB 2 g     2 g Intravenous To [facility] 11/04/16 0824 11/04/16 1025      Assessment/Plan: s/p Procedure(s): APPENDECTOMY LAPAROSCOPIC (N/A) Advance diet Discharge  LOS: 0 days    TOTH III,PAUL S 11/05/2016

## 2016-12-26 ENCOUNTER — Encounter: Payer: Self-pay | Admitting: Interventional Radiology

## 2017-03-20 ENCOUNTER — Encounter: Payer: Self-pay | Admitting: Gastroenterology

## 2017-05-28 DIAGNOSIS — Z0000 Encounter for general adult medical examination without abnormal findings: Secondary | ICD-10-CM | POA: Diagnosis not present

## 2017-05-28 DIAGNOSIS — F79 Unspecified intellectual disabilities: Secondary | ICD-10-CM | POA: Diagnosis not present

## 2017-05-28 DIAGNOSIS — E039 Hypothyroidism, unspecified: Secondary | ICD-10-CM | POA: Diagnosis not present

## 2017-05-28 DIAGNOSIS — Z86010 Personal history of colonic polyps: Secondary | ICD-10-CM | POA: Diagnosis not present

## 2017-06-04 DIAGNOSIS — R262 Difficulty in walking, not elsewhere classified: Secondary | ICD-10-CM | POA: Diagnosis not present

## 2017-06-04 DIAGNOSIS — M6250 Muscle wasting and atrophy, not elsewhere classified, unspecified site: Secondary | ICD-10-CM | POA: Diagnosis not present

## 2017-06-04 DIAGNOSIS — M25562 Pain in left knee: Secondary | ICD-10-CM | POA: Diagnosis not present

## 2017-06-04 DIAGNOSIS — M25551 Pain in right hip: Secondary | ICD-10-CM | POA: Diagnosis not present

## 2017-06-09 DIAGNOSIS — M6250 Muscle wasting and atrophy, not elsewhere classified, unspecified site: Secondary | ICD-10-CM | POA: Diagnosis not present

## 2017-06-09 DIAGNOSIS — R262 Difficulty in walking, not elsewhere classified: Secondary | ICD-10-CM | POA: Diagnosis not present

## 2017-06-09 DIAGNOSIS — M25551 Pain in right hip: Secondary | ICD-10-CM | POA: Diagnosis not present

## 2017-06-09 DIAGNOSIS — M25562 Pain in left knee: Secondary | ICD-10-CM | POA: Diagnosis not present

## 2017-06-11 DIAGNOSIS — M25562 Pain in left knee: Secondary | ICD-10-CM | POA: Diagnosis not present

## 2017-06-11 DIAGNOSIS — M6250 Muscle wasting and atrophy, not elsewhere classified, unspecified site: Secondary | ICD-10-CM | POA: Diagnosis not present

## 2017-06-11 DIAGNOSIS — M25551 Pain in right hip: Secondary | ICD-10-CM | POA: Diagnosis not present

## 2017-06-11 DIAGNOSIS — R262 Difficulty in walking, not elsewhere classified: Secondary | ICD-10-CM | POA: Diagnosis not present

## 2017-06-16 DIAGNOSIS — R262 Difficulty in walking, not elsewhere classified: Secondary | ICD-10-CM | POA: Diagnosis not present

## 2017-06-16 DIAGNOSIS — M25562 Pain in left knee: Secondary | ICD-10-CM | POA: Diagnosis not present

## 2017-06-16 DIAGNOSIS — M6250 Muscle wasting and atrophy, not elsewhere classified, unspecified site: Secondary | ICD-10-CM | POA: Diagnosis not present

## 2017-06-16 DIAGNOSIS — M25551 Pain in right hip: Secondary | ICD-10-CM | POA: Diagnosis not present

## 2017-06-19 DIAGNOSIS — M25551 Pain in right hip: Secondary | ICD-10-CM | POA: Diagnosis not present

## 2017-06-19 DIAGNOSIS — R262 Difficulty in walking, not elsewhere classified: Secondary | ICD-10-CM | POA: Diagnosis not present

## 2017-06-19 DIAGNOSIS — M25562 Pain in left knee: Secondary | ICD-10-CM | POA: Diagnosis not present

## 2017-06-19 DIAGNOSIS — M6250 Muscle wasting and atrophy, not elsewhere classified, unspecified site: Secondary | ICD-10-CM | POA: Diagnosis not present

## 2017-06-23 DIAGNOSIS — M25551 Pain in right hip: Secondary | ICD-10-CM | POA: Diagnosis not present

## 2017-06-23 DIAGNOSIS — R262 Difficulty in walking, not elsewhere classified: Secondary | ICD-10-CM | POA: Diagnosis not present

## 2017-06-23 DIAGNOSIS — M25562 Pain in left knee: Secondary | ICD-10-CM | POA: Diagnosis not present

## 2017-06-23 DIAGNOSIS — M6250 Muscle wasting and atrophy, not elsewhere classified, unspecified site: Secondary | ICD-10-CM | POA: Diagnosis not present

## 2017-06-26 DIAGNOSIS — M25551 Pain in right hip: Secondary | ICD-10-CM | POA: Diagnosis not present

## 2017-06-26 DIAGNOSIS — M25562 Pain in left knee: Secondary | ICD-10-CM | POA: Diagnosis not present

## 2017-06-26 DIAGNOSIS — M6250 Muscle wasting and atrophy, not elsewhere classified, unspecified site: Secondary | ICD-10-CM | POA: Diagnosis not present

## 2017-06-26 DIAGNOSIS — R262 Difficulty in walking, not elsewhere classified: Secondary | ICD-10-CM | POA: Diagnosis not present

## 2017-06-30 DIAGNOSIS — R262 Difficulty in walking, not elsewhere classified: Secondary | ICD-10-CM | POA: Diagnosis not present

## 2017-06-30 DIAGNOSIS — M25551 Pain in right hip: Secondary | ICD-10-CM | POA: Diagnosis not present

## 2017-06-30 DIAGNOSIS — M6250 Muscle wasting and atrophy, not elsewhere classified, unspecified site: Secondary | ICD-10-CM | POA: Diagnosis not present

## 2017-06-30 DIAGNOSIS — M25562 Pain in left knee: Secondary | ICD-10-CM | POA: Diagnosis not present

## 2017-07-02 DIAGNOSIS — M6250 Muscle wasting and atrophy, not elsewhere classified, unspecified site: Secondary | ICD-10-CM | POA: Diagnosis not present

## 2017-07-02 DIAGNOSIS — M25551 Pain in right hip: Secondary | ICD-10-CM | POA: Diagnosis not present

## 2017-07-02 DIAGNOSIS — R262 Difficulty in walking, not elsewhere classified: Secondary | ICD-10-CM | POA: Diagnosis not present

## 2017-07-02 DIAGNOSIS — M25562 Pain in left knee: Secondary | ICD-10-CM | POA: Diagnosis not present

## 2017-07-07 DIAGNOSIS — M6250 Muscle wasting and atrophy, not elsewhere classified, unspecified site: Secondary | ICD-10-CM | POA: Diagnosis not present

## 2017-07-07 DIAGNOSIS — R262 Difficulty in walking, not elsewhere classified: Secondary | ICD-10-CM | POA: Diagnosis not present

## 2017-07-07 DIAGNOSIS — M25562 Pain in left knee: Secondary | ICD-10-CM | POA: Diagnosis not present

## 2017-07-07 DIAGNOSIS — M25551 Pain in right hip: Secondary | ICD-10-CM | POA: Diagnosis not present

## 2017-07-09 DIAGNOSIS — M25562 Pain in left knee: Secondary | ICD-10-CM | POA: Diagnosis not present

## 2017-07-09 DIAGNOSIS — M6250 Muscle wasting and atrophy, not elsewhere classified, unspecified site: Secondary | ICD-10-CM | POA: Diagnosis not present

## 2017-07-09 DIAGNOSIS — R262 Difficulty in walking, not elsewhere classified: Secondary | ICD-10-CM | POA: Diagnosis not present

## 2017-07-09 DIAGNOSIS — M25551 Pain in right hip: Secondary | ICD-10-CM | POA: Diagnosis not present

## 2017-07-14 DIAGNOSIS — R262 Difficulty in walking, not elsewhere classified: Secondary | ICD-10-CM | POA: Diagnosis not present

## 2017-07-14 DIAGNOSIS — M25551 Pain in right hip: Secondary | ICD-10-CM | POA: Diagnosis not present

## 2017-07-14 DIAGNOSIS — M6250 Muscle wasting and atrophy, not elsewhere classified, unspecified site: Secondary | ICD-10-CM | POA: Diagnosis not present

## 2017-07-14 DIAGNOSIS — M25562 Pain in left knee: Secondary | ICD-10-CM | POA: Diagnosis not present

## 2017-07-18 DIAGNOSIS — M25551 Pain in right hip: Secondary | ICD-10-CM | POA: Diagnosis not present

## 2017-07-18 DIAGNOSIS — M6250 Muscle wasting and atrophy, not elsewhere classified, unspecified site: Secondary | ICD-10-CM | POA: Diagnosis not present

## 2017-07-18 DIAGNOSIS — R262 Difficulty in walking, not elsewhere classified: Secondary | ICD-10-CM | POA: Diagnosis not present

## 2017-07-18 DIAGNOSIS — M25562 Pain in left knee: Secondary | ICD-10-CM | POA: Diagnosis not present

## 2017-07-21 DIAGNOSIS — R262 Difficulty in walking, not elsewhere classified: Secondary | ICD-10-CM | POA: Diagnosis not present

## 2017-07-21 DIAGNOSIS — M6250 Muscle wasting and atrophy, not elsewhere classified, unspecified site: Secondary | ICD-10-CM | POA: Diagnosis not present

## 2017-07-21 DIAGNOSIS — M25551 Pain in right hip: Secondary | ICD-10-CM | POA: Diagnosis not present

## 2017-07-21 DIAGNOSIS — M25562 Pain in left knee: Secondary | ICD-10-CM | POA: Diagnosis not present

## 2017-07-23 DIAGNOSIS — M25562 Pain in left knee: Secondary | ICD-10-CM | POA: Diagnosis not present

## 2017-07-23 DIAGNOSIS — M25551 Pain in right hip: Secondary | ICD-10-CM | POA: Diagnosis not present

## 2017-07-23 DIAGNOSIS — R262 Difficulty in walking, not elsewhere classified: Secondary | ICD-10-CM | POA: Diagnosis not present

## 2017-07-23 DIAGNOSIS — M6250 Muscle wasting and atrophy, not elsewhere classified, unspecified site: Secondary | ICD-10-CM | POA: Diagnosis not present

## 2017-07-28 DIAGNOSIS — R262 Difficulty in walking, not elsewhere classified: Secondary | ICD-10-CM | POA: Diagnosis not present

## 2017-07-28 DIAGNOSIS — M25562 Pain in left knee: Secondary | ICD-10-CM | POA: Diagnosis not present

## 2017-07-28 DIAGNOSIS — M6250 Muscle wasting and atrophy, not elsewhere classified, unspecified site: Secondary | ICD-10-CM | POA: Diagnosis not present

## 2017-07-28 DIAGNOSIS — M25551 Pain in right hip: Secondary | ICD-10-CM | POA: Diagnosis not present

## 2017-07-31 DIAGNOSIS — R262 Difficulty in walking, not elsewhere classified: Secondary | ICD-10-CM | POA: Diagnosis not present

## 2017-07-31 DIAGNOSIS — M6250 Muscle wasting and atrophy, not elsewhere classified, unspecified site: Secondary | ICD-10-CM | POA: Diagnosis not present

## 2017-07-31 DIAGNOSIS — M25551 Pain in right hip: Secondary | ICD-10-CM | POA: Diagnosis not present

## 2017-07-31 DIAGNOSIS — M25562 Pain in left knee: Secondary | ICD-10-CM | POA: Diagnosis not present

## 2017-08-14 DIAGNOSIS — H25013 Cortical age-related cataract, bilateral: Secondary | ICD-10-CM | POA: Diagnosis not present

## 2017-08-14 DIAGNOSIS — H5015 Alternating exotropia: Secondary | ICD-10-CM | POA: Diagnosis not present

## 2017-08-14 DIAGNOSIS — H40013 Open angle with borderline findings, low risk, bilateral: Secondary | ICD-10-CM | POA: Diagnosis not present

## 2017-08-14 DIAGNOSIS — H2513 Age-related nuclear cataract, bilateral: Secondary | ICD-10-CM | POA: Diagnosis not present

## 2017-08-22 DIAGNOSIS — Z86010 Personal history of colonic polyps: Secondary | ICD-10-CM | POA: Diagnosis not present

## 2017-08-22 DIAGNOSIS — K5730 Diverticulosis of large intestine without perforation or abscess without bleeding: Secondary | ICD-10-CM | POA: Diagnosis not present

## 2017-12-13 DIAGNOSIS — B9789 Other viral agents as the cause of diseases classified elsewhere: Secondary | ICD-10-CM | POA: Diagnosis not present

## 2017-12-13 DIAGNOSIS — H9201 Otalgia, right ear: Secondary | ICD-10-CM | POA: Diagnosis not present

## 2017-12-13 DIAGNOSIS — I4891 Unspecified atrial fibrillation: Secondary | ICD-10-CM | POA: Diagnosis not present

## 2017-12-13 DIAGNOSIS — J028 Acute pharyngitis due to other specified organisms: Secondary | ICD-10-CM | POA: Diagnosis not present

## 2018-01-16 DIAGNOSIS — M5416 Radiculopathy, lumbar region: Secondary | ICD-10-CM | POA: Diagnosis not present

## 2018-01-16 DIAGNOSIS — G4700 Insomnia, unspecified: Secondary | ICD-10-CM | POA: Diagnosis not present

## 2018-02-11 DIAGNOSIS — M545 Low back pain: Secondary | ICD-10-CM | POA: Diagnosis not present

## 2018-02-11 DIAGNOSIS — M25562 Pain in left knee: Secondary | ICD-10-CM | POA: Diagnosis not present

## 2018-02-11 DIAGNOSIS — R262 Difficulty in walking, not elsewhere classified: Secondary | ICD-10-CM | POA: Diagnosis not present

## 2018-02-11 DIAGNOSIS — M25552 Pain in left hip: Secondary | ICD-10-CM | POA: Diagnosis not present

## 2018-02-13 DIAGNOSIS — M25562 Pain in left knee: Secondary | ICD-10-CM | POA: Diagnosis not present

## 2018-02-13 DIAGNOSIS — R262 Difficulty in walking, not elsewhere classified: Secondary | ICD-10-CM | POA: Diagnosis not present

## 2018-02-13 DIAGNOSIS — M545 Low back pain: Secondary | ICD-10-CM | POA: Diagnosis not present

## 2018-02-13 DIAGNOSIS — M25552 Pain in left hip: Secondary | ICD-10-CM | POA: Diagnosis not present

## 2018-02-16 DIAGNOSIS — M25562 Pain in left knee: Secondary | ICD-10-CM | POA: Diagnosis not present

## 2018-02-16 DIAGNOSIS — R262 Difficulty in walking, not elsewhere classified: Secondary | ICD-10-CM | POA: Diagnosis not present

## 2018-02-16 DIAGNOSIS — M545 Low back pain: Secondary | ICD-10-CM | POA: Diagnosis not present

## 2018-02-16 DIAGNOSIS — M25552 Pain in left hip: Secondary | ICD-10-CM | POA: Diagnosis not present

## 2018-02-18 DIAGNOSIS — M545 Low back pain: Secondary | ICD-10-CM | POA: Diagnosis not present

## 2018-02-18 DIAGNOSIS — R262 Difficulty in walking, not elsewhere classified: Secondary | ICD-10-CM | POA: Diagnosis not present

## 2018-02-18 DIAGNOSIS — M25562 Pain in left knee: Secondary | ICD-10-CM | POA: Diagnosis not present

## 2018-02-18 DIAGNOSIS — M25552 Pain in left hip: Secondary | ICD-10-CM | POA: Diagnosis not present

## 2018-02-20 DIAGNOSIS — M545 Low back pain: Secondary | ICD-10-CM | POA: Diagnosis not present

## 2018-02-20 DIAGNOSIS — M25552 Pain in left hip: Secondary | ICD-10-CM | POA: Diagnosis not present

## 2018-02-20 DIAGNOSIS — R262 Difficulty in walking, not elsewhere classified: Secondary | ICD-10-CM | POA: Diagnosis not present

## 2018-02-20 DIAGNOSIS — M25562 Pain in left knee: Secondary | ICD-10-CM | POA: Diagnosis not present

## 2018-02-25 DIAGNOSIS — M25562 Pain in left knee: Secondary | ICD-10-CM | POA: Diagnosis not present

## 2018-02-25 DIAGNOSIS — M545 Low back pain: Secondary | ICD-10-CM | POA: Diagnosis not present

## 2018-02-25 DIAGNOSIS — R262 Difficulty in walking, not elsewhere classified: Secondary | ICD-10-CM | POA: Diagnosis not present

## 2018-02-25 DIAGNOSIS — M25552 Pain in left hip: Secondary | ICD-10-CM | POA: Diagnosis not present

## 2018-02-27 DIAGNOSIS — R262 Difficulty in walking, not elsewhere classified: Secondary | ICD-10-CM | POA: Diagnosis not present

## 2018-02-27 DIAGNOSIS — M545 Low back pain: Secondary | ICD-10-CM | POA: Diagnosis not present

## 2018-02-27 DIAGNOSIS — M25552 Pain in left hip: Secondary | ICD-10-CM | POA: Diagnosis not present

## 2018-02-27 DIAGNOSIS — M25562 Pain in left knee: Secondary | ICD-10-CM | POA: Diagnosis not present

## 2018-03-04 DIAGNOSIS — M25562 Pain in left knee: Secondary | ICD-10-CM | POA: Diagnosis not present

## 2018-03-04 DIAGNOSIS — R262 Difficulty in walking, not elsewhere classified: Secondary | ICD-10-CM | POA: Diagnosis not present

## 2018-03-04 DIAGNOSIS — M545 Low back pain: Secondary | ICD-10-CM | POA: Diagnosis not present

## 2018-03-04 DIAGNOSIS — M25552 Pain in left hip: Secondary | ICD-10-CM | POA: Diagnosis not present

## 2018-03-06 DIAGNOSIS — R262 Difficulty in walking, not elsewhere classified: Secondary | ICD-10-CM | POA: Diagnosis not present

## 2018-03-06 DIAGNOSIS — M25552 Pain in left hip: Secondary | ICD-10-CM | POA: Diagnosis not present

## 2018-03-06 DIAGNOSIS — M545 Low back pain: Secondary | ICD-10-CM | POA: Diagnosis not present

## 2018-03-06 DIAGNOSIS — M25562 Pain in left knee: Secondary | ICD-10-CM | POA: Diagnosis not present

## 2018-03-11 DIAGNOSIS — M25562 Pain in left knee: Secondary | ICD-10-CM | POA: Diagnosis not present

## 2018-03-11 DIAGNOSIS — M25552 Pain in left hip: Secondary | ICD-10-CM | POA: Diagnosis not present

## 2018-03-11 DIAGNOSIS — M545 Low back pain: Secondary | ICD-10-CM | POA: Diagnosis not present

## 2018-03-11 DIAGNOSIS — R262 Difficulty in walking, not elsewhere classified: Secondary | ICD-10-CM | POA: Diagnosis not present

## 2018-03-13 DIAGNOSIS — M25552 Pain in left hip: Secondary | ICD-10-CM | POA: Diagnosis not present

## 2018-03-13 DIAGNOSIS — M25562 Pain in left knee: Secondary | ICD-10-CM | POA: Diagnosis not present

## 2018-03-13 DIAGNOSIS — R262 Difficulty in walking, not elsewhere classified: Secondary | ICD-10-CM | POA: Diagnosis not present

## 2018-03-13 DIAGNOSIS — M545 Low back pain: Secondary | ICD-10-CM | POA: Diagnosis not present

## 2018-03-18 DIAGNOSIS — R262 Difficulty in walking, not elsewhere classified: Secondary | ICD-10-CM | POA: Diagnosis not present

## 2018-03-18 DIAGNOSIS — M25562 Pain in left knee: Secondary | ICD-10-CM | POA: Diagnosis not present

## 2018-03-18 DIAGNOSIS — M25552 Pain in left hip: Secondary | ICD-10-CM | POA: Diagnosis not present

## 2018-03-18 DIAGNOSIS — M545 Low back pain: Secondary | ICD-10-CM | POA: Diagnosis not present

## 2018-03-20 DIAGNOSIS — R262 Difficulty in walking, not elsewhere classified: Secondary | ICD-10-CM | POA: Diagnosis not present

## 2018-03-20 DIAGNOSIS — M25552 Pain in left hip: Secondary | ICD-10-CM | POA: Diagnosis not present

## 2018-03-20 DIAGNOSIS — M25562 Pain in left knee: Secondary | ICD-10-CM | POA: Diagnosis not present

## 2018-03-20 DIAGNOSIS — M545 Low back pain: Secondary | ICD-10-CM | POA: Diagnosis not present

## 2018-03-25 DIAGNOSIS — M25562 Pain in left knee: Secondary | ICD-10-CM | POA: Diagnosis not present

## 2018-03-25 DIAGNOSIS — M25552 Pain in left hip: Secondary | ICD-10-CM | POA: Diagnosis not present

## 2018-03-25 DIAGNOSIS — M545 Low back pain: Secondary | ICD-10-CM | POA: Diagnosis not present

## 2018-03-25 DIAGNOSIS — R262 Difficulty in walking, not elsewhere classified: Secondary | ICD-10-CM | POA: Diagnosis not present

## 2018-03-27 DIAGNOSIS — M25552 Pain in left hip: Secondary | ICD-10-CM | POA: Diagnosis not present

## 2018-03-27 DIAGNOSIS — M545 Low back pain: Secondary | ICD-10-CM | POA: Diagnosis not present

## 2018-03-27 DIAGNOSIS — R262 Difficulty in walking, not elsewhere classified: Secondary | ICD-10-CM | POA: Diagnosis not present

## 2018-03-27 DIAGNOSIS — M25562 Pain in left knee: Secondary | ICD-10-CM | POA: Diagnosis not present

## 2018-04-01 DIAGNOSIS — R262 Difficulty in walking, not elsewhere classified: Secondary | ICD-10-CM | POA: Diagnosis not present

## 2018-04-01 DIAGNOSIS — M25552 Pain in left hip: Secondary | ICD-10-CM | POA: Diagnosis not present

## 2018-04-01 DIAGNOSIS — M25562 Pain in left knee: Secondary | ICD-10-CM | POA: Diagnosis not present

## 2018-04-01 DIAGNOSIS — M545 Low back pain: Secondary | ICD-10-CM | POA: Diagnosis not present

## 2018-04-03 DIAGNOSIS — M25552 Pain in left hip: Secondary | ICD-10-CM | POA: Diagnosis not present

## 2018-04-03 DIAGNOSIS — M545 Low back pain: Secondary | ICD-10-CM | POA: Diagnosis not present

## 2018-04-03 DIAGNOSIS — M25562 Pain in left knee: Secondary | ICD-10-CM | POA: Diagnosis not present

## 2018-04-03 DIAGNOSIS — R262 Difficulty in walking, not elsewhere classified: Secondary | ICD-10-CM | POA: Diagnosis not present

## 2018-04-08 DIAGNOSIS — M25562 Pain in left knee: Secondary | ICD-10-CM | POA: Diagnosis not present

## 2018-04-08 DIAGNOSIS — M25552 Pain in left hip: Secondary | ICD-10-CM | POA: Diagnosis not present

## 2018-04-08 DIAGNOSIS — R262 Difficulty in walking, not elsewhere classified: Secondary | ICD-10-CM | POA: Diagnosis not present

## 2018-04-08 DIAGNOSIS — M545 Low back pain: Secondary | ICD-10-CM | POA: Diagnosis not present

## 2018-04-10 DIAGNOSIS — M25562 Pain in left knee: Secondary | ICD-10-CM | POA: Diagnosis not present

## 2018-04-10 DIAGNOSIS — M25552 Pain in left hip: Secondary | ICD-10-CM | POA: Diagnosis not present

## 2018-04-10 DIAGNOSIS — M545 Low back pain: Secondary | ICD-10-CM | POA: Diagnosis not present

## 2018-04-10 DIAGNOSIS — R262 Difficulty in walking, not elsewhere classified: Secondary | ICD-10-CM | POA: Diagnosis not present

## 2018-04-15 DIAGNOSIS — R262 Difficulty in walking, not elsewhere classified: Secondary | ICD-10-CM | POA: Diagnosis not present

## 2018-04-15 DIAGNOSIS — M545 Low back pain: Secondary | ICD-10-CM | POA: Diagnosis not present

## 2018-04-15 DIAGNOSIS — M25562 Pain in left knee: Secondary | ICD-10-CM | POA: Diagnosis not present

## 2018-04-15 DIAGNOSIS — M25552 Pain in left hip: Secondary | ICD-10-CM | POA: Diagnosis not present

## 2018-04-17 DIAGNOSIS — R262 Difficulty in walking, not elsewhere classified: Secondary | ICD-10-CM | POA: Diagnosis not present

## 2018-04-17 DIAGNOSIS — M545 Low back pain: Secondary | ICD-10-CM | POA: Diagnosis not present

## 2018-04-17 DIAGNOSIS — M25562 Pain in left knee: Secondary | ICD-10-CM | POA: Diagnosis not present

## 2018-04-17 DIAGNOSIS — M25552 Pain in left hip: Secondary | ICD-10-CM | POA: Diagnosis not present

## 2018-04-22 DIAGNOSIS — R262 Difficulty in walking, not elsewhere classified: Secondary | ICD-10-CM | POA: Diagnosis not present

## 2018-04-22 DIAGNOSIS — M25562 Pain in left knee: Secondary | ICD-10-CM | POA: Diagnosis not present

## 2018-04-22 DIAGNOSIS — M545 Low back pain: Secondary | ICD-10-CM | POA: Diagnosis not present

## 2018-04-22 DIAGNOSIS — M25552 Pain in left hip: Secondary | ICD-10-CM | POA: Diagnosis not present

## 2018-04-24 DIAGNOSIS — M25562 Pain in left knee: Secondary | ICD-10-CM | POA: Diagnosis not present

## 2018-04-24 DIAGNOSIS — R262 Difficulty in walking, not elsewhere classified: Secondary | ICD-10-CM | POA: Diagnosis not present

## 2018-04-24 DIAGNOSIS — M545 Low back pain: Secondary | ICD-10-CM | POA: Diagnosis not present

## 2018-04-24 DIAGNOSIS — M25552 Pain in left hip: Secondary | ICD-10-CM | POA: Diagnosis not present

## 2018-04-29 DIAGNOSIS — R262 Difficulty in walking, not elsewhere classified: Secondary | ICD-10-CM | POA: Diagnosis not present

## 2018-04-29 DIAGNOSIS — M25562 Pain in left knee: Secondary | ICD-10-CM | POA: Diagnosis not present

## 2018-04-29 DIAGNOSIS — M25552 Pain in left hip: Secondary | ICD-10-CM | POA: Diagnosis not present

## 2018-04-29 DIAGNOSIS — M545 Low back pain: Secondary | ICD-10-CM | POA: Diagnosis not present

## 2018-05-01 DIAGNOSIS — M25562 Pain in left knee: Secondary | ICD-10-CM | POA: Diagnosis not present

## 2018-05-01 DIAGNOSIS — M25552 Pain in left hip: Secondary | ICD-10-CM | POA: Diagnosis not present

## 2018-05-01 DIAGNOSIS — M545 Low back pain: Secondary | ICD-10-CM | POA: Diagnosis not present

## 2018-05-01 DIAGNOSIS — R262 Difficulty in walking, not elsewhere classified: Secondary | ICD-10-CM | POA: Diagnosis not present

## 2018-05-05 DIAGNOSIS — R262 Difficulty in walking, not elsewhere classified: Secondary | ICD-10-CM | POA: Diagnosis not present

## 2018-05-05 DIAGNOSIS — M25562 Pain in left knee: Secondary | ICD-10-CM | POA: Diagnosis not present

## 2018-05-05 DIAGNOSIS — M25552 Pain in left hip: Secondary | ICD-10-CM | POA: Diagnosis not present

## 2018-05-05 DIAGNOSIS — M545 Low back pain: Secondary | ICD-10-CM | POA: Diagnosis not present

## 2018-05-07 DIAGNOSIS — R262 Difficulty in walking, not elsewhere classified: Secondary | ICD-10-CM | POA: Diagnosis not present

## 2018-05-07 DIAGNOSIS — M25552 Pain in left hip: Secondary | ICD-10-CM | POA: Diagnosis not present

## 2018-05-07 DIAGNOSIS — M25562 Pain in left knee: Secondary | ICD-10-CM | POA: Diagnosis not present

## 2018-05-07 DIAGNOSIS — M545 Low back pain: Secondary | ICD-10-CM | POA: Diagnosis not present

## 2018-05-11 DIAGNOSIS — M545 Low back pain: Secondary | ICD-10-CM | POA: Diagnosis not present

## 2018-05-11 DIAGNOSIS — M25562 Pain in left knee: Secondary | ICD-10-CM | POA: Diagnosis not present

## 2018-05-11 DIAGNOSIS — M25552 Pain in left hip: Secondary | ICD-10-CM | POA: Diagnosis not present

## 2018-05-11 DIAGNOSIS — R262 Difficulty in walking, not elsewhere classified: Secondary | ICD-10-CM | POA: Diagnosis not present

## 2018-05-14 DIAGNOSIS — M25552 Pain in left hip: Secondary | ICD-10-CM | POA: Diagnosis not present

## 2018-05-14 DIAGNOSIS — M25562 Pain in left knee: Secondary | ICD-10-CM | POA: Diagnosis not present

## 2018-05-14 DIAGNOSIS — M545 Low back pain: Secondary | ICD-10-CM | POA: Diagnosis not present

## 2018-05-14 DIAGNOSIS — R262 Difficulty in walking, not elsewhere classified: Secondary | ICD-10-CM | POA: Diagnosis not present

## 2018-05-19 DIAGNOSIS — M545 Low back pain: Secondary | ICD-10-CM | POA: Diagnosis not present

## 2018-05-19 DIAGNOSIS — R262 Difficulty in walking, not elsewhere classified: Secondary | ICD-10-CM | POA: Diagnosis not present

## 2018-05-19 DIAGNOSIS — M25552 Pain in left hip: Secondary | ICD-10-CM | POA: Diagnosis not present

## 2018-05-19 DIAGNOSIS — M25562 Pain in left knee: Secondary | ICD-10-CM | POA: Diagnosis not present

## 2018-05-22 DIAGNOSIS — M25552 Pain in left hip: Secondary | ICD-10-CM | POA: Diagnosis not present

## 2018-05-22 DIAGNOSIS — M545 Low back pain: Secondary | ICD-10-CM | POA: Diagnosis not present

## 2018-05-22 DIAGNOSIS — M25562 Pain in left knee: Secondary | ICD-10-CM | POA: Diagnosis not present

## 2018-05-22 DIAGNOSIS — R262 Difficulty in walking, not elsewhere classified: Secondary | ICD-10-CM | POA: Diagnosis not present

## 2018-05-25 DIAGNOSIS — M25552 Pain in left hip: Secondary | ICD-10-CM | POA: Diagnosis not present

## 2018-05-25 DIAGNOSIS — M545 Low back pain: Secondary | ICD-10-CM | POA: Diagnosis not present

## 2018-05-25 DIAGNOSIS — R262 Difficulty in walking, not elsewhere classified: Secondary | ICD-10-CM | POA: Diagnosis not present

## 2018-05-25 DIAGNOSIS — M25562 Pain in left knee: Secondary | ICD-10-CM | POA: Diagnosis not present

## 2018-05-29 DIAGNOSIS — R262 Difficulty in walking, not elsewhere classified: Secondary | ICD-10-CM | POA: Diagnosis not present

## 2018-05-29 DIAGNOSIS — M545 Low back pain: Secondary | ICD-10-CM | POA: Diagnosis not present

## 2018-05-29 DIAGNOSIS — M25552 Pain in left hip: Secondary | ICD-10-CM | POA: Diagnosis not present

## 2018-05-29 DIAGNOSIS — M25562 Pain in left knee: Secondary | ICD-10-CM | POA: Diagnosis not present

## 2018-06-01 DIAGNOSIS — M545 Low back pain: Secondary | ICD-10-CM | POA: Diagnosis not present

## 2018-06-01 DIAGNOSIS — R262 Difficulty in walking, not elsewhere classified: Secondary | ICD-10-CM | POA: Diagnosis not present

## 2018-06-01 DIAGNOSIS — M25552 Pain in left hip: Secondary | ICD-10-CM | POA: Diagnosis not present

## 2018-06-01 DIAGNOSIS — M25562 Pain in left knee: Secondary | ICD-10-CM | POA: Diagnosis not present

## 2018-06-05 DIAGNOSIS — M25552 Pain in left hip: Secondary | ICD-10-CM | POA: Diagnosis not present

## 2018-06-05 DIAGNOSIS — M545 Low back pain: Secondary | ICD-10-CM | POA: Diagnosis not present

## 2018-06-05 DIAGNOSIS — M25562 Pain in left knee: Secondary | ICD-10-CM | POA: Diagnosis not present

## 2018-06-05 DIAGNOSIS — R262 Difficulty in walking, not elsewhere classified: Secondary | ICD-10-CM | POA: Diagnosis not present

## 2018-06-08 DIAGNOSIS — M25552 Pain in left hip: Secondary | ICD-10-CM | POA: Diagnosis not present

## 2018-06-08 DIAGNOSIS — R262 Difficulty in walking, not elsewhere classified: Secondary | ICD-10-CM | POA: Diagnosis not present

## 2018-06-08 DIAGNOSIS — M25562 Pain in left knee: Secondary | ICD-10-CM | POA: Diagnosis not present

## 2018-06-08 DIAGNOSIS — M545 Low back pain: Secondary | ICD-10-CM | POA: Diagnosis not present

## 2018-06-12 DIAGNOSIS — R262 Difficulty in walking, not elsewhere classified: Secondary | ICD-10-CM | POA: Diagnosis not present

## 2018-06-12 DIAGNOSIS — M25562 Pain in left knee: Secondary | ICD-10-CM | POA: Diagnosis not present

## 2018-06-12 DIAGNOSIS — M545 Low back pain: Secondary | ICD-10-CM | POA: Diagnosis not present

## 2018-06-12 DIAGNOSIS — M25552 Pain in left hip: Secondary | ICD-10-CM | POA: Diagnosis not present

## 2018-06-15 DIAGNOSIS — M545 Low back pain: Secondary | ICD-10-CM | POA: Diagnosis not present

## 2018-06-15 DIAGNOSIS — M25552 Pain in left hip: Secondary | ICD-10-CM | POA: Diagnosis not present

## 2018-06-15 DIAGNOSIS — M25562 Pain in left knee: Secondary | ICD-10-CM | POA: Diagnosis not present

## 2018-06-15 DIAGNOSIS — R262 Difficulty in walking, not elsewhere classified: Secondary | ICD-10-CM | POA: Diagnosis not present

## 2018-06-19 DIAGNOSIS — M25552 Pain in left hip: Secondary | ICD-10-CM | POA: Diagnosis not present

## 2018-06-19 DIAGNOSIS — R262 Difficulty in walking, not elsewhere classified: Secondary | ICD-10-CM | POA: Diagnosis not present

## 2018-06-19 DIAGNOSIS — M25562 Pain in left knee: Secondary | ICD-10-CM | POA: Diagnosis not present

## 2018-06-19 DIAGNOSIS — M545 Low back pain: Secondary | ICD-10-CM | POA: Diagnosis not present

## 2018-06-22 DIAGNOSIS — M25562 Pain in left knee: Secondary | ICD-10-CM | POA: Diagnosis not present

## 2018-06-22 DIAGNOSIS — R262 Difficulty in walking, not elsewhere classified: Secondary | ICD-10-CM | POA: Diagnosis not present

## 2018-06-22 DIAGNOSIS — M25552 Pain in left hip: Secondary | ICD-10-CM | POA: Diagnosis not present

## 2018-06-22 DIAGNOSIS — M545 Low back pain: Secondary | ICD-10-CM | POA: Diagnosis not present

## 2018-07-06 DIAGNOSIS — R262 Difficulty in walking, not elsewhere classified: Secondary | ICD-10-CM | POA: Diagnosis not present

## 2018-07-06 DIAGNOSIS — M25562 Pain in left knee: Secondary | ICD-10-CM | POA: Diagnosis not present

## 2018-07-06 DIAGNOSIS — M545 Low back pain: Secondary | ICD-10-CM | POA: Diagnosis not present

## 2018-07-06 DIAGNOSIS — M25552 Pain in left hip: Secondary | ICD-10-CM | POA: Diagnosis not present

## 2018-07-10 DIAGNOSIS — M545 Low back pain: Secondary | ICD-10-CM | POA: Diagnosis not present

## 2018-07-10 DIAGNOSIS — M25552 Pain in left hip: Secondary | ICD-10-CM | POA: Diagnosis not present

## 2018-07-10 DIAGNOSIS — R262 Difficulty in walking, not elsewhere classified: Secondary | ICD-10-CM | POA: Diagnosis not present

## 2018-07-10 DIAGNOSIS — M25562 Pain in left knee: Secondary | ICD-10-CM | POA: Diagnosis not present

## 2018-07-15 DIAGNOSIS — R262 Difficulty in walking, not elsewhere classified: Secondary | ICD-10-CM | POA: Diagnosis not present

## 2018-07-15 DIAGNOSIS — M545 Low back pain: Secondary | ICD-10-CM | POA: Diagnosis not present

## 2018-07-15 DIAGNOSIS — M25552 Pain in left hip: Secondary | ICD-10-CM | POA: Diagnosis not present

## 2018-07-15 DIAGNOSIS — M25562 Pain in left knee: Secondary | ICD-10-CM | POA: Diagnosis not present

## 2018-07-17 DIAGNOSIS — M25562 Pain in left knee: Secondary | ICD-10-CM | POA: Diagnosis not present

## 2018-07-17 DIAGNOSIS — M25552 Pain in left hip: Secondary | ICD-10-CM | POA: Diagnosis not present

## 2018-07-17 DIAGNOSIS — M545 Low back pain: Secondary | ICD-10-CM | POA: Diagnosis not present

## 2018-07-17 DIAGNOSIS — R262 Difficulty in walking, not elsewhere classified: Secondary | ICD-10-CM | POA: Diagnosis not present

## 2018-07-24 DIAGNOSIS — M545 Low back pain: Secondary | ICD-10-CM | POA: Diagnosis not present

## 2018-07-24 DIAGNOSIS — M25552 Pain in left hip: Secondary | ICD-10-CM | POA: Diagnosis not present

## 2018-07-24 DIAGNOSIS — M25562 Pain in left knee: Secondary | ICD-10-CM | POA: Diagnosis not present

## 2018-07-24 DIAGNOSIS — R262 Difficulty in walking, not elsewhere classified: Secondary | ICD-10-CM | POA: Diagnosis not present

## 2018-07-29 DIAGNOSIS — M25552 Pain in left hip: Secondary | ICD-10-CM | POA: Diagnosis not present

## 2018-07-29 DIAGNOSIS — R262 Difficulty in walking, not elsewhere classified: Secondary | ICD-10-CM | POA: Diagnosis not present

## 2018-07-29 DIAGNOSIS — M545 Low back pain: Secondary | ICD-10-CM | POA: Diagnosis not present

## 2018-07-29 DIAGNOSIS — M25562 Pain in left knee: Secondary | ICD-10-CM | POA: Diagnosis not present

## 2018-07-31 DIAGNOSIS — R262 Difficulty in walking, not elsewhere classified: Secondary | ICD-10-CM | POA: Diagnosis not present

## 2018-07-31 DIAGNOSIS — M25552 Pain in left hip: Secondary | ICD-10-CM | POA: Diagnosis not present

## 2018-07-31 DIAGNOSIS — M25562 Pain in left knee: Secondary | ICD-10-CM | POA: Diagnosis not present

## 2018-07-31 DIAGNOSIS — M545 Low back pain: Secondary | ICD-10-CM | POA: Diagnosis not present

## 2018-08-05 DIAGNOSIS — M25562 Pain in left knee: Secondary | ICD-10-CM | POA: Diagnosis not present

## 2018-08-05 DIAGNOSIS — M545 Low back pain: Secondary | ICD-10-CM | POA: Diagnosis not present

## 2018-08-05 DIAGNOSIS — M25552 Pain in left hip: Secondary | ICD-10-CM | POA: Diagnosis not present

## 2018-08-05 DIAGNOSIS — R262 Difficulty in walking, not elsewhere classified: Secondary | ICD-10-CM | POA: Diagnosis not present

## 2018-08-07 DIAGNOSIS — R262 Difficulty in walking, not elsewhere classified: Secondary | ICD-10-CM | POA: Diagnosis not present

## 2018-08-07 DIAGNOSIS — M25562 Pain in left knee: Secondary | ICD-10-CM | POA: Diagnosis not present

## 2018-08-07 DIAGNOSIS — M545 Low back pain: Secondary | ICD-10-CM | POA: Diagnosis not present

## 2018-08-07 DIAGNOSIS — M25552 Pain in left hip: Secondary | ICD-10-CM | POA: Diagnosis not present

## 2018-08-12 DIAGNOSIS — R262 Difficulty in walking, not elsewhere classified: Secondary | ICD-10-CM | POA: Diagnosis not present

## 2018-08-12 DIAGNOSIS — M545 Low back pain: Secondary | ICD-10-CM | POA: Diagnosis not present

## 2018-08-12 DIAGNOSIS — M25552 Pain in left hip: Secondary | ICD-10-CM | POA: Diagnosis not present

## 2018-08-12 DIAGNOSIS — M25562 Pain in left knee: Secondary | ICD-10-CM | POA: Diagnosis not present

## 2018-08-14 DIAGNOSIS — M545 Low back pain: Secondary | ICD-10-CM | POA: Diagnosis not present

## 2018-08-14 DIAGNOSIS — M25552 Pain in left hip: Secondary | ICD-10-CM | POA: Diagnosis not present

## 2018-08-14 DIAGNOSIS — R262 Difficulty in walking, not elsewhere classified: Secondary | ICD-10-CM | POA: Diagnosis not present

## 2018-08-14 DIAGNOSIS — M25562 Pain in left knee: Secondary | ICD-10-CM | POA: Diagnosis not present

## 2018-08-19 DIAGNOSIS — M25562 Pain in left knee: Secondary | ICD-10-CM | POA: Diagnosis not present

## 2018-08-19 DIAGNOSIS — M25552 Pain in left hip: Secondary | ICD-10-CM | POA: Diagnosis not present

## 2018-08-19 DIAGNOSIS — R262 Difficulty in walking, not elsewhere classified: Secondary | ICD-10-CM | POA: Diagnosis not present

## 2018-08-19 DIAGNOSIS — M545 Low back pain: Secondary | ICD-10-CM | POA: Diagnosis not present

## 2018-08-21 DIAGNOSIS — M545 Low back pain: Secondary | ICD-10-CM | POA: Diagnosis not present

## 2018-08-21 DIAGNOSIS — R262 Difficulty in walking, not elsewhere classified: Secondary | ICD-10-CM | POA: Diagnosis not present

## 2018-08-21 DIAGNOSIS — M25562 Pain in left knee: Secondary | ICD-10-CM | POA: Diagnosis not present

## 2018-08-21 DIAGNOSIS — M25552 Pain in left hip: Secondary | ICD-10-CM | POA: Diagnosis not present

## 2018-08-28 DIAGNOSIS — R262 Difficulty in walking, not elsewhere classified: Secondary | ICD-10-CM | POA: Diagnosis not present

## 2018-08-28 DIAGNOSIS — M25552 Pain in left hip: Secondary | ICD-10-CM | POA: Diagnosis not present

## 2018-08-28 DIAGNOSIS — M25562 Pain in left knee: Secondary | ICD-10-CM | POA: Diagnosis not present

## 2018-08-28 DIAGNOSIS — M545 Low back pain: Secondary | ICD-10-CM | POA: Diagnosis not present

## 2018-12-26 ENCOUNTER — Inpatient Hospital Stay (HOSPITAL_COMMUNITY)
Admission: EM | Admit: 2018-12-26 | Discharge: 2018-12-30 | DRG: 481 | Disposition: A | Discharge status: Skilled Nursing Facility | Payer: Medicare Other | Attending: Internal Medicine | Admitting: Internal Medicine

## 2018-12-26 ENCOUNTER — Emergency Department (HOSPITAL_COMMUNITY): Payer: Medicare Other

## 2018-12-26 ENCOUNTER — Encounter (HOSPITAL_COMMUNITY): Payer: Self-pay | Admitting: Emergency Medicine

## 2018-12-26 ENCOUNTER — Other Ambulatory Visit: Payer: Self-pay

## 2018-12-26 DIAGNOSIS — Z7401 Bed confinement status: Secondary | ICD-10-CM | POA: Diagnosis not present

## 2018-12-26 DIAGNOSIS — S72002A Fracture of unspecified part of neck of left femur, initial encounter for closed fracture: Secondary | ICD-10-CM | POA: Diagnosis not present

## 2018-12-26 DIAGNOSIS — S8992XA Unspecified injury of left lower leg, initial encounter: Secondary | ICD-10-CM | POA: Diagnosis not present

## 2018-12-26 DIAGNOSIS — Z01811 Encounter for preprocedural respiratory examination: Secondary | ICD-10-CM

## 2018-12-26 DIAGNOSIS — W010XXA Fall on same level from slipping, tripping and stumbling without subsequent striking against object, initial encounter: Secondary | ICD-10-CM | POA: Diagnosis present

## 2018-12-26 DIAGNOSIS — E039 Hypothyroidism, unspecified: Secondary | ICD-10-CM | POA: Diagnosis not present

## 2018-12-26 DIAGNOSIS — W19XXXA Unspecified fall, initial encounter: Secondary | ICD-10-CM | POA: Diagnosis not present

## 2018-12-26 DIAGNOSIS — R4189 Other symptoms and signs involving cognitive functions and awareness: Secondary | ICD-10-CM | POA: Diagnosis not present

## 2018-12-26 DIAGNOSIS — R339 Retention of urine, unspecified: Secondary | ICD-10-CM | POA: Diagnosis not present

## 2018-12-26 DIAGNOSIS — S72142A Displaced intertrochanteric fracture of left femur, initial encounter for closed fracture: Principal | ICD-10-CM | POA: Diagnosis present

## 2018-12-26 DIAGNOSIS — Z981 Arthrodesis status: Secondary | ICD-10-CM

## 2018-12-26 DIAGNOSIS — Z833 Family history of diabetes mellitus: Secondary | ICD-10-CM

## 2018-12-26 DIAGNOSIS — S72002D Fracture of unspecified part of neck of left femur, subsequent encounter for closed fracture with routine healing: Secondary | ICD-10-CM | POA: Diagnosis not present

## 2018-12-26 DIAGNOSIS — R52 Pain, unspecified: Secondary | ICD-10-CM | POA: Diagnosis not present

## 2018-12-26 DIAGNOSIS — F819 Developmental disorder of scholastic skills, unspecified: Secondary | ICD-10-CM | POA: Diagnosis not present

## 2018-12-26 DIAGNOSIS — S72043D Displaced fracture of base of neck of unspecified femur, subsequent encounter for closed fracture with routine healing: Secondary | ICD-10-CM | POA: Diagnosis not present

## 2018-12-26 DIAGNOSIS — S299XXA Unspecified injury of thorax, initial encounter: Secondary | ICD-10-CM | POA: Diagnosis not present

## 2018-12-26 DIAGNOSIS — K3533 Acute appendicitis with perforation and localized peritonitis, with abscess: Secondary | ICD-10-CM | POA: Diagnosis not present

## 2018-12-26 DIAGNOSIS — I4891 Unspecified atrial fibrillation: Secondary | ICD-10-CM | POA: Diagnosis not present

## 2018-12-26 DIAGNOSIS — S72012A Unspecified intracapsular fracture of left femur, initial encounter for closed fracture: Secondary | ICD-10-CM | POA: Diagnosis not present

## 2018-12-26 DIAGNOSIS — Z888 Allergy status to other drugs, medicaments and biological substances status: Secondary | ICD-10-CM | POA: Diagnosis not present

## 2018-12-26 DIAGNOSIS — M2550 Pain in unspecified joint: Secondary | ICD-10-CM | POA: Diagnosis not present

## 2018-12-26 DIAGNOSIS — I8291 Chronic embolism and thrombosis of unspecified vein: Secondary | ICD-10-CM | POA: Diagnosis not present

## 2018-12-26 DIAGNOSIS — K219 Gastro-esophageal reflux disease without esophagitis: Secondary | ICD-10-CM | POA: Diagnosis present

## 2018-12-26 DIAGNOSIS — I482 Chronic atrial fibrillation, unspecified: Secondary | ICD-10-CM

## 2018-12-26 DIAGNOSIS — Z7982 Long term (current) use of aspirin: Secondary | ICD-10-CM

## 2018-12-26 DIAGNOSIS — K37 Unspecified appendicitis: Secondary | ICD-10-CM | POA: Diagnosis not present

## 2018-12-26 DIAGNOSIS — W19XXXD Unspecified fall, subsequent encounter: Secondary | ICD-10-CM | POA: Diagnosis not present

## 2018-12-26 DIAGNOSIS — Y92009 Unspecified place in unspecified non-institutional (private) residence as the place of occurrence of the external cause: Secondary | ICD-10-CM | POA: Diagnosis not present

## 2018-12-26 DIAGNOSIS — S0990XA Unspecified injury of head, initial encounter: Secondary | ICD-10-CM | POA: Diagnosis not present

## 2018-12-26 DIAGNOSIS — S199XXA Unspecified injury of neck, initial encounter: Secondary | ICD-10-CM | POA: Diagnosis not present

## 2018-12-26 DIAGNOSIS — I4892 Unspecified atrial flutter: Secondary | ICD-10-CM | POA: Diagnosis not present

## 2018-12-26 DIAGNOSIS — M6281 Muscle weakness (generalized): Secondary | ICD-10-CM | POA: Diagnosis not present

## 2018-12-26 DIAGNOSIS — Z803 Family history of malignant neoplasm of breast: Secondary | ICD-10-CM

## 2018-12-26 DIAGNOSIS — Z79890 Hormone replacement therapy (postmenopausal): Secondary | ICD-10-CM

## 2018-12-26 DIAGNOSIS — S79912A Unspecified injury of left hip, initial encounter: Secondary | ICD-10-CM | POA: Diagnosis not present

## 2018-12-26 DIAGNOSIS — M1612 Unilateral primary osteoarthritis, left hip: Secondary | ICD-10-CM | POA: Diagnosis not present

## 2018-12-26 DIAGNOSIS — M79605 Pain in left leg: Secondary | ICD-10-CM | POA: Diagnosis not present

## 2018-12-26 LAB — BASIC METABOLIC PANEL
Anion gap: 8 (ref 5–15)
BUN: 18 mg/dL (ref 6–20)
CALCIUM: 8.8 mg/dL — AB (ref 8.9–10.3)
CO2: 23 mmol/L (ref 22–32)
Chloride: 102 mmol/L (ref 98–111)
Creatinine, Ser: 0.83 mg/dL (ref 0.61–1.24)
GFR calc Af Amer: >60 mL/min (ref >60)
GLUCOSE: 106 mg/dL — AB (ref 70–99)
Potassium: 3.5 mmol/L (ref 3.5–5.1)
SODIUM: 133 mmol/L — AB (ref 135–145)

## 2018-12-26 LAB — CBC WITH DIFFERENTIAL/PLATELET
Abs Immature Granulocytes: 0.04 10*3/uL (ref 0.00–0.07)
Basophils Absolute: 0 10*3/uL (ref 0.0–0.1)
Basophils Relative: 0 %
Eosinophils Absolute: 0 10*3/uL (ref 0.0–0.5)
Eosinophils Relative: 0 %
HEMATOCRIT: 41.8 % (ref 39.0–52.0)
HEMOGLOBIN: 14.7 g/dL (ref 13.0–17.0)
IMMATURE GRANULOCYTES: 0 %
LYMPHS ABS: 0.8 10*3/uL (ref 0.7–4.0)
LYMPHS PCT: 8 %
MCH: 34.3 pg — AB (ref 26.0–34.0)
MCHC: 35.2 g/dL (ref 30.0–36.0)
MCV: 97.4 fL (ref 80.0–100.0)
MONO ABS: 0.8 10*3/uL (ref 0.1–1.0)
Monocytes Relative: 8 %
NEUTROS ABS: 8.8 10*3/uL — AB (ref 1.7–7.7)
NEUTROS PCT: 84 %
Platelets: 220 10*3/uL (ref 150–400)
RBC: 4.29 MIL/uL (ref 4.22–5.81)
RDW: 12 % (ref 11.5–15.5)
WBC: 10.4 10*3/uL (ref 4.0–10.5)
nRBC: 0 % (ref 0.0–0.2)

## 2018-12-26 LAB — SAMPLE TO BLOOD BANK

## 2018-12-26 MED ORDER — DILTIAZEM HCL ER COATED BEADS 120 MG PO CP24
120.0000 mg | ORAL_CAPSULE | Freq: Every day | ORAL | Status: DC
  Administered 2018-12-27 – 2018-12-30 (×4): 120 mg via ORAL
  Filled 2018-12-26 (×4): qty 1

## 2018-12-26 MED ORDER — MORPHINE SULFATE (PF) 4 MG/ML IV SOLN
4.0000 mg | Freq: Once | INTRAVENOUS | Status: AC
  Administered 2018-12-26: 4 mg via INTRAVENOUS
  Filled 2018-12-26: qty 1

## 2018-12-26 MED ORDER — FENTANYL CITRATE (PF) 100 MCG/2ML IJ SOLN
50.0000 ug | INTRAMUSCULAR | Status: DC | PRN
  Administered 2018-12-27 – 2018-12-29 (×9): 50 ug via INTRAVENOUS
  Filled 2018-12-26 (×8): qty 2

## 2018-12-26 MED ORDER — METHOCARBAMOL 500 MG PO TABS
500.0000 mg | ORAL_TABLET | Freq: Four times a day (QID) | ORAL | Status: DC | PRN
  Administered 2018-12-27 – 2018-12-28 (×4): 500 mg via ORAL
  Filled 2018-12-26 (×5): qty 1

## 2018-12-26 MED ORDER — HYDROCODONE-ACETAMINOPHEN 5-325 MG PO TABS
1.0000 | ORAL_TABLET | Freq: Four times a day (QID) | ORAL | Status: DC | PRN
  Administered 2018-12-27 – 2018-12-30 (×4): 2 via ORAL
  Filled 2018-12-26 (×5): qty 2

## 2018-12-26 MED ORDER — LEVOTHYROXINE SODIUM 25 MCG PO TABS
25.0000 ug | ORAL_TABLET | Freq: Every day | ORAL | Status: DC
  Administered 2018-12-28 – 2018-12-30 (×3): 25 ug via ORAL
  Filled 2018-12-26 (×3): qty 1

## 2018-12-26 MED ORDER — SODIUM CHLORIDE 0.9 % IV SOLN
INTRAVENOUS | Status: DC
  Administered 2018-12-27: via INTRAVENOUS

## 2018-12-26 MED ORDER — METHOCARBAMOL 1000 MG/10ML IJ SOLN
500.0000 mg | Freq: Four times a day (QID) | INTRAVENOUS | Status: DC | PRN
  Filled 2018-12-26: qty 5

## 2018-12-26 NOTE — ED Notes (Signed)
ED TO INPATIENT HANDOFF REPORT  Name/Age/Gender Joshua Moyer 58 y.o. male  Code Status    Code Status Orders  (From admission, onward)         Start     Ordered   12/26/18 2141  Full code  Continuous     12/26/18 2144        Code Status History    Date Active Date Inactive Code Status Order ID Comments User Context   11/04/2016 1241 11/05/2016 1658 Full Code [id number]  Jovita Kussmaul, MD Inpatient   08/22/2016 0114 08/26/2016 1628 Full Code [id number]  Jovita Kussmaul, MD Inpatient      Home/SNF/Other Home  Chief Complaint Fall/leg injury  Level of Care/Admitting Diagnosis ED Disposition    ED Disposition Condition [facility] Area: [place] [[id number]]  Level of Care: Med-Surg [16]  Diagnosis: Closed left hip fracture [facility]) [[id number]]  Admitting Physician: Doreatha Massed  Attending Physician: Etta Quill [phone]  Estimated length of stay: past midnight tomorrow  Certification:: I certify this patient will need inpatient services for at least 2 midnights  PT Class (Do Not Modify): Inpatient [101]  PT Acc Code (Do Not Modify): Private [1]       Medical History Past Medical History:  Diagnosis Date  . Atrial fibrillation ([place])   . Atrial flutter ([place])   . Broken neck ([place])    In 1996  . Cognitive developmental delay   . Dysrhythmia   . GERD (gastroesophageal reflux disease)   . Hypothyroidism     Allergies Allergies  Allergen Reactions  . Chlorhexidine Gluconate [Chlorhexidine] Itching  . Statins Other (See Comments)    Leg Pain     IV Location/Drains/Wounds Patient Lines/Drains/Airways Status   Active Line/Drains/Airways    Name:   Placement date:   Placement time:   Site:   Days:   Closed System Drain 1 Right RLQ Other (Comment) 12 Fr.   08/22/16    1347    RLQ   856   Incision (Closed) 11/04/16 Abdomen   11/04/16    1134     782   Incision - 3 Ports Abdomen 1: Umbilicus 2: Left;Upper 3: Left;Lower    11/04/16    1045     782          Labs/Imaging Results for orders placed or performed during the hospital encounter of 12/26/18 (from the past 48 hour(s))  CBC with Differential     Status: Abnormal   Collection Time: 12/26/18  9:33 PM  Result Value Ref Range   WBC 10.4 4.0 - 10.5 K/uL   RBC 4.29 4.22 - 5.81 MIL/uL   Hemoglobin 14.7 13.0 - 17.0 g/dL   HCT 41.8 39.0 - 52.0 %   MCV 97.4 80.0 - 100.0 fL   MCH 34.3 (H) 26.0 - 34.0 pg   MCHC 35.2 30.0 - 36.0 g/dL   RDW 12.0 11.5 - 15.5 %   Platelets 220 150 - 400 K/uL   nRBC 0.0 0.0 - 0.2 %   Neutrophils Relative % 84 %   Neutro Abs 8.8 (H) 1.7 - 7.7 K/uL   Lymphocytes Relative 8 %   Lymphs Abs 0.8 0.7 - 4.0 K/uL   Monocytes Relative 8 %   Monocytes Absolute 0.8 0.1 - 1.0 K/uL   Eosinophils Relative 0 %   Eosinophils Absolute 0.0 0.0 - 0.5 K/uL   Basophils Relative 0 %   Basophils Absolute 0.0  0.0 - 0.1 K/uL   Immature Granulocytes 0 %   Abs Immature Granulocytes 0.04 0.00 - 0.07 K/uL    Comment: Performed at [facility], [place] [address], [place], [place] [address]  Basic metabolic panel     Status: Abnormal   Collection Time: 12/26/18  9:33 PM  Result Value Ref Range   Sodium 133 (L) 135 - 145 mmol/L   Potassium 3.5 3.5 - 5.1 mmol/L   Chloride 102 98 - 111 mmol/L   CO2 23 22 - 32 mmol/L   Glucose, Bld 106 (H) 70 - 99 mg/dL   BUN 18 6 - 20 mg/dL   Creatinine, Ser 0.83 0.61 - 1.24 mg/dL   Calcium 8.8 (L) 8.9 - 10.3 mg/dL   GFR calc non Af Amer >60 >60 mL/min   GFR calc Af Amer >60 >60 mL/min   Anion gap 8 5 - 15    Comment: Performed at [facility], [place] [address], [place], [place] [address]  Sample to Blood Bank     Status: None   Collection Time: 12/26/18  9:33 PM  Result Value Ref Range   Blood Bank Specimen SAMPLE AVAILABLE FOR TESTING    Sample Expiration      12/27/2018 Performed at [facility], [place] [address], [place],  [address]    Dg Chest 1 View  Result Date:  12/26/2018 CLINICAL DATA:  Fall on left side EXAM: CHEST  1 VIEW COMPARISON:  None. FINDINGS: Heart size within normal limits. The lungs appear clear. No obvious acute rib discontinuity or other appreciable fracture. No blunting of the costophrenic angles. IMPRESSION: 1.  No significant abnormality identified. Electronically Signed   By: Van Clines M.D.   On: 12/26/2018 20:07   Ct Head Wo Contrast  Result Date: 12/26/2018 CLINICAL DATA:  Tripped and fell today EXAM: CT HEAD WITHOUT CONTRAST CT CERVICAL SPINE WITHOUT CONTRAST TECHNIQUE: Multidetector CT imaging of the head and cervical spine was performed following the standard protocol without intravenous contrast. Multiplanar CT image reconstructions of the cervical spine were also generated. COMPARISON:  None FINDINGS: CT HEAD FINDINGS Brain: Scattered motion artifacts. Normal ventricular morphology. No midline shift or mass effect. Normal appearance of brain parenchyma. No intracranial hemorrhage, mass lesion or evidence of acute infarction. No extra-axial fluid collections. Vascular: No hyperdense vessels. Skull: Skull appears intact. Sinuses/Orbits: Mild mucosal thickening in the maxillary sinuses. Remaining paranasal sinuses and mastoid air cells clear. Other: N/A CT CERVICAL SPINE FINDINGS Alignment: Exam significantly degraded by patient motion. Minimal retrolisthesis at C5-C6. No additional gross cervical mild alignment identified. Skull base and vertebrae: Disc space narrowing at C5-C6 and C3-C4. Endplate spur formation at C5-C6. Encroachment upon cervical neural foramina bilaterally at C5-C6 by uncovertebral and facet hypertrophy. Multilevel facet degenerative changes. Vertebral body heights appear grossly maintained. No gross fracture is identified. Prior posterior fusion of C1-C2. Uncovertebral spurs also encroach upon the LEFT C3-C4 and RIGHT C6-C7 foramina, to lesser degrees at additional levels. Soft tissues and spinal canal: Prevertebral  soft tissues grossly normal thickness Disc levels:  No additional gross abnormalities Upper chest: Tips of lung apices clear. Other: N/A IMPRESSION: No acute intracranial abnormalities. 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 Postoperative changes of posterior fusion at C1-C2. No gross acute cervical spine abnormalities are identified though the exam is severely limited secondary to patient motion, unable to remain still due to ongoing complaints of leg pain if patient has neck symptoms consider  repeat imaging once the patient is able to fully cooperate with imaging. Electronically Signed   By: Lavonia Dana M.D.   On: 12/26/2018 20:57   Ct Cervical Spine Wo Contrast  Result Date: 12/26/2018 CLINICAL DATA:  Tripped and fell today EXAM: CT HEAD WITHOUT CONTRAST CT CERVICAL SPINE WITHOUT CONTRAST TECHNIQUE: Multidetector CT imaging of the head and cervical spine was performed following the standard protocol without intravenous contrast. Multiplanar CT image reconstructions of the cervical spine were also generated. COMPARISON:  None FINDINGS: CT HEAD FINDINGS Brain: Scattered motion artifacts. Normal ventricular morphology. No midline shift or mass effect. Normal appearance of brain parenchyma. No intracranial hemorrhage, mass lesion or evidence of acute infarction. No extra-axial fluid collections. Vascular: No hyperdense vessels. Skull: Skull appears intact. Sinuses/Orbits: Mild mucosal thickening in the maxillary sinuses. Remaining paranasal sinuses and mastoid air cells clear. Other: N/A CT CERVICAL SPINE FINDINGS Alignment: Exam significantly degraded by patient motion. Minimal retrolisthesis at C5-C6. No additional gross cervical mild alignment identified. Skull base and vertebrae: Disc space narrowing at C5-C6 and C3-C4. Endplate spur formation at C5-C6. Encroachment upon cervical neural foramina bilaterally at C5-C6 by uncovertebral and facet hypertrophy. Multilevel facet  degenerative changes. Vertebral body heights appear grossly maintained. No gross fracture is identified. Prior posterior fusion of C1-C2. Uncovertebral spurs also encroach upon the LEFT C3-C4 and RIGHT C6-C7 foramina, to lesser degrees at additional levels. Soft tissues and spinal canal: Prevertebral soft tissues grossly normal thickness Disc levels:  No additional gross abnormalities Upper chest: Tips of lung apices clear. Other: N/A IMPRESSION: No acute intracranial abnormalities. 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 Postoperative changes of posterior fusion at C1-C2. No gross acute cervical spine abnormalities are identified though the exam is severely limited secondary to patient motion, unable to remain still due to ongoing complaints of leg pain if patient has neck symptoms consider repeat imaging once the patient is able to fully cooperate with imaging. Electronically Signed   By: Lavonia Dana M.D.   On: 12/26/2018 20:57   Dg Hip Unilat With Pelvis 2-3 Views Left  Result Date: 12/26/2018 CLINICAL DATA:  Fall on the left side EXAM: DG HIP (WITH OR WITHOUT PELVIS) 2-3V LEFT COMPARISON:  None. FINDINGS: Left hip reverse oblique fracture with 3.0 cm medial displacement of the distal fracture fragment, 2.4 cm of overlap, and extensive varus angulation. Severe degenerative arthropathy of the left hip. Moderate degenerative arthropathy of the right hip. IMPRESSION: 1. Considerably displaced, overlapped, and angulated reverse oblique fracture of the left hip. 2. Severe degenerative arthropathy of the left hip. Moderate degenerative arthropathy of the right hip. Electronically Signed   By: Van Clines M.D.   On: 12/26/2018 20:06    Pending Labs Unresulted Labs (From admission, onward)    Start     Ordered   12/26/18 2141  HIV antibody (Routine Testing)  Once,   R     12/26/18 2144          Vitals/Pain Today's Vitals   12/26/18 1858 12/26/18 1915 12/26/18  2000  BP:  119/74 129/88  Pulse:  (!) 112   Temp: 98.4 F (36.9 C)    TempSrc: Oral    SpO2:  97%   Weight: 87.5 kg    Height: 6\' 5"  (1.956 m)    PainSc: 8       Isolation Precautions No active isolations  Medications Medications  HYDROcodone-acetaminophen (NORCO/VICODIN) 5-325 MG per tablet 1-2 tablet (has no administration in time range)  fentaNYL (SUBLIMAZE) injection 50 mcg (has no administration in time range)  methocarbamol (ROBAXIN) tablet 500 mg (has no administration in time range)    Or  methocarbamol (ROBAXIN) 500 mg in dextrose 5 % 50 mL IVPB (has no administration in time range)  0.9 %  sodium chloride infusion (has no administration in time range)  diltiazem (CARDIZEM CD) 24 hr capsule 120 mg (has no administration in time range)  levothyroxine (SYNTHROID, LEVOTHROID) tablet 25 mcg (has no administration in time range)  morphine 4 MG/ML injection 4 mg (4 mg Intravenous Given 12/26/18 2107)    Mobility walks

## 2018-12-26 NOTE — H&P (Signed)
History and Physical    [name] MVH:[id number] DOB: 09/10/1961 DOA: 12/26/2018  PCP: Hulan Fess, MD  Patient coming from: Home  I have personally briefly reviewed patient's old medical records in [place]  Chief Complaint: Fall  HPI: Joshua Moyer is a 58 y.o. male with medical history significant of A.Fib just on ASA 81, broken neck in 96 s/p ORIF, developmental delay.  Patient presents to the ED with severe L hip pain following a fall.  Going down a few wooden steps, slipped, fell, landed on L hip.  Didn't hit head or neck.  Crawled back to steps and was eventually able to call for help from a passer by.   ED Course: L hip fx.  No neck pain and denies hitting head or nec, but got CT head and neck because EMS put him in a C.Collar.  Of note patient was having chronic pain with L hip and limp on L side for which he was seeing PT recently.  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   Review of Systems: As per HPI otherwise 10 point review of systems negative.   Past Medical History:  Diagnosis Date  . Atrial fibrillation ([place])   . Atrial flutter ([place])   . Broken neck ([place])    In 1996  . Cognitive developmental delay   . Dysrhythmia   . GERD (gastroesophageal reflux disease)   . Hypothyroidism     Past Surgical History:  Procedure Laterality Date  . CARDIAC ELECTROPHYSIOLOGY STUDY AND ABLATION    . INGUINAL HERNIA REPAIR     right  . IR GENERIC HISTORICAL  09/10/2016   IR RADIOLOGIST EVAL & MGMT 09/10/2016 Corrie Mckusick, DO [facility] INTERV RAD  . LAPAROSCOPIC APPENDECTOMY N/A 11/04/2016   Procedure: APPENDECTOMY LAPAROSCOPIC;  Surgeon: Autumn Messing III, MD;  Location: [place];  Service: General;  Laterality: N/A;  . NECK SURGERY     broke neck in 1996     reports that he has never smoked. He has never used smokeless tobacco. He reports that he does not drink alcohol or use drugs.  Allergies  Allergen Reactions  . Chlorhexidine  Gluconate [Chlorhexidine] Itching  . Statins Other (See Comments)    Leg Pain     Family History  Problem Relation Age of Onset  . Diabetes Mother   . Breast cancer Mother      Prior to Admission medications   Medication Sig Start Date End Date Taking? Authorizing Provider  acetaminophen (TYLENOL) 325 MG tablet Take 2 tablets (650 mg total) by mouth every 6 (six) hours as needed for fever, headache, mild pain or moderate pain. 08/26/16   Stark Klein, MD  amoxicillin-clavulanate (AUGMENTIN) 875-125 MG tablet Take 1 tablet by mouth 2 (two) times daily. 08/26/16   Stark Klein, MD  Ascorbic Acid (VITAMIN C) 1000 MG tablet Take 1,000 mg by mouth daily.    [provider]  aspirin EC 81 MG tablet Take 81 mg by mouth daily.    [provider]  b complex vitamins tablet Take 1 tablet by mouth daily.    [provider]  cholecalciferol (VITAMIN D) 1000 units tablet Take 1,000 Units by mouth daily.    [provider]  diltiazem (TIAZAC) 120 MG 24 hr capsule Take 120 mg by mouth daily.  08/20/16   [provider]  HYDROcodone-acetaminophen (NORCO) 5-325 MG tablet Take 1-2 tablets by mouth every 4 (four) hours as needed  for moderate pain. 11/04/16   Jovita Kussmaul, MD  levothyroxine (SYNTHROID, LEVOTHROID) 25 MCG tablet TAKE ONE TABLET BY MOUTH ONCE A DAY ON AN EMPTY STOMACH 07/14/16   [provider]  NON FORMULARY Gaba 500 mg-Take one po daily    [provider]  vitamin E 400 UNIT capsule Take 400 Units by mouth daily.    [provider]    Physical Exam: Vitals:   12/26/18 1858 12/26/18 1915 12/26/18 2000  BP:  119/74 129/88  Pulse:  (!) 112   Temp: 98.4 F (36.9 C)    TempSrc: Oral    SpO2:  97%   Weight: 87.5 kg    Height: 6\' 5"  (1.956 m)      Constitutional: NAD, calm, comfortable Eyes: PERRL, lids and conjunctivae normal ENMT: Mucous membranes are moist. Posterior pharynx clear of any exudate or lesions.Normal  dentition.  Neck: normal, supple, no masses, no thyromegaly Respiratory: clear to auscultation bilaterally, no wheezing, no crackles. Normal respiratory effort. No accessory muscle use.  Cardiovascular: Regular rate and rhythm, no murmurs / rubs / gallops. No extremity edema. 2+ pedal pulses. No carotid bruits.  Abdomen: no tenderness, no masses palpated. No hepatosplenomegaly. Bowel sounds positive.  Musculoskeletal: LLE shortened and externally rotated, hip TTP Skin: no rashes, lesions, ulcers. No induration Neurologic: CN 2-12 grossly intact. Sensation intact, DTR normal. Strength 5/5 in all 4.  Psychiatric: Normal judgment and insight. Alert and oriented x 3. Normal mood.    Labs on Admission: I have personally reviewed following labs and imaging studies  CBC: No results for input(s): WBC, NEUTROABS, HGB, HCT, MCV, PLT in the last 168 hours. Basic Metabolic Panel: No results for input(s): NA, K, CL, CO2, GLUCOSE, BUN, CREATININE, CALCIUM, MG, PHOS in the last 168 hours. GFR: CrCl cannot be calculated (Patient's most recent lab result is older than the maximum 21 days allowed.). Liver Function Tests: No results for input(s): AST, ALT, ALKPHOS, BILITOT, PROT, ALBUMIN in the last 168 hours. No results for input(s): LIPASE, AMYLASE in the last 168 hours. No results for input(s): AMMONIA in the last 168 hours. Coagulation Profile: No results for input(s): INR, PROTIME in the last 168 hours. Cardiac Enzymes: No results for input(s): CKTOTAL, CKMB, CKMBINDEX, TROPONINI in the last 168 hours. BNP (last 3 results) No results for input(s): PROBNP in the last 8760 hours. HbA1C: No results for input(s): HGBA1C in the last 72 hours. CBG: No results for input(s): GLUCAP in the last 168 hours. Lipid Profile: No results for input(s): CHOL, HDL, LDLCALC, TRIG, CHOLHDL, LDLDIRECT in the last 72 hours. Thyroid Function Tests: No results for input(s): TSH, T4TOTAL, FREET4, T3FREE, THYROIDAB in  the last 72 hours. Anemia Panel: No results for input(s): VITAMINB12, FOLATE, FERRITIN, TIBC, IRON, RETICCTPCT in the last 72 hours. Urine analysis:    Component Value Date/Time   COLORURINE ORANGE (A) 08/21/2016 2250   APPEARANCEUR TURBID (A) 08/21/2016 2250   LABSPEC 1.032 (H) 08/21/2016 2250   PHURINE 5.5 08/21/2016 2250   GLUCOSEU NEGATIVE 08/21/2016 2250   HGBUR TRACE (A) 08/21/2016 2250   BILIRUBINUR MODERATE (A) 08/21/2016 2250   KETONESUR >80 (A) 08/21/2016 2250   PROTEINUR 30 (A) 08/21/2016 2250   NITRITE POSITIVE (A) 08/21/2016 2250   LEUKOCYTESUR SMALL (A) 08/21/2016 2250    Radiological Exams on Admission: Dg Chest 1 View  Result Date: 12/26/2018 CLINICAL DATA:  Fall on left side EXAM: CHEST  1 VIEW COMPARISON:  None. FINDINGS: Heart size within normal limits. The  lungs appear clear. No obvious acute rib discontinuity or other appreciable fracture. No blunting of the costophrenic angles. IMPRESSION: 1.  No significant abnormality identified. Electronically Signed   By: Van Clines M.D.   On: 12/26/2018 20:07   Ct Head Wo Contrast  Result Date: 12/26/2018 CLINICAL DATA:  Tripped and fell today EXAM: CT HEAD WITHOUT CONTRAST CT CERVICAL SPINE WITHOUT CONTRAST TECHNIQUE: Multidetector CT imaging of the head and cervical spine was performed following the standard protocol without intravenous contrast. Multiplanar CT image reconstructions of the cervical spine were also generated. COMPARISON:  None FINDINGS: CT HEAD FINDINGS Brain: Scattered motion artifacts. Normal ventricular morphology. No midline shift or mass effect. Normal appearance of brain parenchyma. No intracranial hemorrhage, mass lesion or evidence of acute infarction. No extra-axial fluid collections. Vascular: No hyperdense vessels. Skull: Skull appears intact. Sinuses/Orbits: Mild mucosal thickening in the maxillary sinuses. Remaining paranasal sinuses and mastoid air cells clear. Other: N/A CT CERVICAL SPINE  FINDINGS Alignment: Exam significantly degraded by patient motion. Minimal retrolisthesis at C5-C6. No additional gross cervical mild alignment identified. Skull base and vertebrae: Disc space narrowing at C5-C6 and C3-C4. Endplate spur formation at C5-C6. Encroachment upon cervical neural foramina bilaterally at C5-C6 by uncovertebral and facet hypertrophy. Multilevel facet degenerative changes. Vertebral body heights appear grossly maintained. No gross fracture is identified. Prior posterior fusion of C1-C2. Uncovertebral spurs also encroach upon the LEFT C3-C4 and RIGHT C6-C7 foramina, to lesser degrees at additional levels. Soft tissues and spinal canal: Prevertebral soft tissues grossly normal thickness Disc levels:  No additional gross abnormalities Upper chest: Tips of lung apices clear. Other: N/A IMPRESSION: No acute intracranial abnormalities. 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 Postoperative changes of posterior fusion at C1-C2. No gross acute cervical spine abnormalities are identified though the exam is severely limited secondary to patient motion, unable to remain still due to ongoing complaints of leg pain if patient has neck symptoms consider repeat imaging once the patient is able to fully cooperate with imaging. Electronically Signed   By: Lavonia Dana M.D.   On: 12/26/2018 20:57   Ct Cervical Spine Wo Contrast  Result Date: 12/26/2018 CLINICAL DATA:  Tripped and fell today EXAM: CT HEAD WITHOUT CONTRAST CT CERVICAL SPINE WITHOUT CONTRAST TECHNIQUE: Multidetector CT imaging of the head and cervical spine was performed following the standard protocol without intravenous contrast. Multiplanar CT image reconstructions of the cervical spine were also generated. COMPARISON:  None FINDINGS: CT HEAD FINDINGS Brain: Scattered motion artifacts. Normal ventricular morphology. No midline shift or mass effect. Normal appearance of brain parenchyma. No intracranial  hemorrhage, mass lesion or evidence of acute infarction. No extra-axial fluid collections. Vascular: No hyperdense vessels. Skull: Skull appears intact. Sinuses/Orbits: Mild mucosal thickening in the maxillary sinuses. Remaining paranasal sinuses and mastoid air cells clear. Other: N/A CT CERVICAL SPINE FINDINGS Alignment: Exam significantly degraded by patient motion. Minimal retrolisthesis at C5-C6. No additional gross cervical mild alignment identified. Skull base and vertebrae: Disc space narrowing at C5-C6 and C3-C4. Endplate spur formation at C5-C6. Encroachment upon cervical neural foramina bilaterally at C5-C6 by uncovertebral and facet hypertrophy. Multilevel facet degenerative changes. Vertebral body heights appear grossly maintained. No gross fracture is identified. Prior posterior fusion of C1-C2. Uncovertebral spurs also encroach upon the LEFT C3-C4 and RIGHT C6-C7 foramina, to lesser degrees at additional levels. Soft tissues and spinal canal: Prevertebral soft tissues grossly normal thickness Disc levels:  No additional gross abnormalities Upper chest: Tips of lung apices clear.  Other: N/A IMPRESSION: No acute intracranial abnormalities. 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 Postoperative changes of posterior fusion at C1-C2. No gross acute cervical spine abnormalities are identified though the exam is severely limited secondary to patient motion, unable to remain still due to ongoing complaints of leg pain if patient has neck symptoms consider repeat imaging once the patient is able to fully cooperate with imaging. Electronically Signed   By: Lavonia Dana M.D.   On: 12/26/2018 20:57   Dg Hip Unilat With Pelvis 2-3 Views Left  Result Date: 12/26/2018 CLINICAL DATA:  Fall on the left side EXAM: DG HIP (WITH OR WITHOUT PELVIS) 2-3V LEFT COMPARISON:  None. FINDINGS: Left hip reverse oblique fracture with 3.0 cm medial displacement of the distal fracture fragment,  2.4 cm of overlap, and extensive varus angulation. Severe degenerative arthropathy of the left hip. Moderate degenerative arthropathy of the right hip. IMPRESSION: 1. Considerably displaced, overlapped, and angulated reverse oblique fracture of the left hip. 2. Severe degenerative arthropathy of the left hip. Moderate degenerative arthropathy of the right hip. Electronically Signed   By: Van Clines M.D.   On: 12/26/2018 20:06    EKG: Independently reviewed.  Assessment/Plan Principal Problem:   Closed left hip fracture (HCC) Active Problems:   Cognitive developmental delay   Atrial fibrillation - chronic, rate-contreolled    1. Closed L hip fx - 1. Ortho to fix tomorrow 2. Ortho may wish to consider (and family will ask about) THA given severe baseline arthritis of L hip, apparently patient was having L hip pain and limp at baseline BEFORE the hip fx today requiring him to see PT, though this hadnt been formally diagnosed as severe arthritis until X-ray today. 3. Hip fx pathway 4. Pain control: Norco and PRN fentanyl and robaxin PRN 5. NPO after MN 6. IVF NS at 100 7. CBC and BMP pending 8. CXR and EKG pending 2. A.Fib - 1. Continue cardizem 2. Currently just on ASA 81 for anticoagulation, will hold 3. Developmental delay - baseline, seems mild  DVT prophylaxis: SCDs Code Status: Full Family Communication: Family at bedside, sister is POA, sister is also a Immunologist Disposition Plan: TBD, likely rehab after admit Consults called: Ortho Admission status: Admit to inpatient  Severity of Illness: The appropriate patient status for this patient is INPATIENT. Inpatient status is judged to be reasonable and necessary in order to provide the required intensity of service to ensure the patient's safety. The patient's presenting symptoms, physical exam findings, and initial radiographic and laboratory data in the context of their chronic comorbidities is felt to place them at high risk  for further clinical deterioration. Furthermore, it is not anticipated that the patient will be medically stable for discharge from the hospital within 2 midnights of admission. The following factors support the patient status of inpatient.   " The patient's presenting symptoms include severe L hip pain after fall. " The worrisome physical exam findings include LLE deformity and external rotation. " The initial radiographic and laboratory data are worrisome because of L hip fracture, displaced, needs surgical repair. " The chronic co-morbidities include A.Fib, cognitive delay.   * I certify that at the point of admission it is my clinical judgment that the patient will require inpatient hospital care spanning beyond 2 midnights from the point of admission due to high intensity of service, high risk for further deterioration and high frequency of surveillance required.*    GARDNER, JARED M. DO [facility] Pager [phone] Only works nights!  If 7AM-7PM, please contact the primary day team physician taking care of patient  [email or web address] Password [facility]  12/26/2018, 9:45 PM

## 2018-12-26 NOTE — ED Triage Notes (Addendum)
Pt from home brought in by [facility]. Pt tripped going up the steps outside. Pt has external rotation to left leg. Pt normally has internal rotation of leg. Pt also has some weakness to left leg at baseline. Pt received 200 mcg fentanyl.  EMS reports VSS

## 2018-12-26 NOTE — Consult Note (Signed)
ORTHOPAEDIC CONSULTATION  REQUESTING PHYSICIAN: Etta Quill, DO  PCP:  Hulan Fess, MD  Chief Complaint: Left hip fracture  HPI: Joshua Moyer is a 58 y.o. male who complains of Left hip pain following a mechanical fall prior to arrival.  He does have some cognitive delay and lives with his father.  He does state to me however that he is independent with all ADLs and does not require assistive devices for ambulation.  He currently is not employed but previously did have a part-time job.  He states that he lost his balance and fell landing on his left hip.  He describes left thigh and groin pain.  He denies any numbness or tingling.  Past Medical History:  Diagnosis Date  . Atrial fibrillation ([place])   . Atrial flutter ([place])   . Broken neck ([place])    In 1996  . Cognitive developmental delay   . Dysrhythmia   . GERD (gastroesophageal reflux disease)   . Hypothyroidism    Past Surgical History:  Procedure Laterality Date  . CARDIAC ELECTROPHYSIOLOGY STUDY AND ABLATION    . INGUINAL HERNIA REPAIR     right  . IR GENERIC HISTORICAL  09/10/2016   IR RADIOLOGIST EVAL & MGMT 09/10/2016 Corrie Mckusick, DO [facility] INTERV RAD  . LAPAROSCOPIC APPENDECTOMY N/A 11/04/2016   Procedure: APPENDECTOMY LAPAROSCOPIC;  Surgeon: Autumn Messing III, MD;  Location: [facility];  Service: General;  Laterality: N/A;  . NECK SURGERY     broke neck in 1996   Social History   Socioeconomic History  . Marital status: Single    Spouse name: Not on file  . Number of children: Not on file  . Years of education: Not on file  . Highest education level: Not on file  Occupational History  . Not on file  Social Needs  . Financial resource strain: Not on file  . Food insecurity:    Worry: Not on file    Inability: Not on file  . Transportation needs:    Medical: Not on file    Non-medical: Not on file  Tobacco Use  . Smoking status: Never Smoker  . Smokeless tobacco: Never Used  Substance and Sexual  Activity  . Alcohol use: No  . Drug use: No  . Sexual activity: Not on file  Lifestyle  . Physical activity:    Days per week: Not on file    Minutes per session: Not on file  . Stress: Not on file  Relationships  . Social connections:    Talks on phone: Not on file    Gets together: Not on file    Attends religious service: Not on file    Active member of club or organization: Not on file    Attends meetings of clubs or organizations: Not on file    Relationship status: Not on file  Other Topics Concern  . Not on file  Social History Narrative  . Not on file   Family History  Problem Relation Age of Onset  . Diabetes Mother   . Breast cancer Mother    Allergies  Allergen Reactions  . Chlorhexidine Gluconate [Chlorhexidine] Itching  . Statins Other (See Comments)    Leg Pain    Prior to Admission medications   Medication Sig Start Date End Date Taking? Authorizing Provider  Ascorbic Acid (VITAMIN C) 1000 MG tablet Take 1,000 mg by mouth daily.   Yes [provider]  aspirin EC 81 MG tablet Take 81 mg  by mouth daily.   Yes [provider]  b complex vitamins tablet Take 1 tablet by mouth daily.   Yes [provider]  cholecalciferol (VITAMIN D) 1000 units tablet Take 1,000 Units by mouth daily.   Yes [provider]  diltiazem (TIAZAC) 120 MG 24 hr capsule Take 120 mg by mouth daily.  08/20/16  Yes [provider]  levothyroxine (SYNTHROID, LEVOTHROID) 25 MCG tablet Take 25 mcg by mouth daily before breakfast.  07/14/16  Yes [provider]  naproxen sodium (ALEVE) 220 MG tablet Take 220 mg by mouth 2 (two) times daily as needed (pain).   Yes [provider]  OVER THE COUNTER MEDICATION Take 1 tablet by mouth daily. "Gaba" supplement   Yes [provider]  vitamin E 400 UNIT capsule Take 400 Units by mouth daily.   Yes [provider]  NON FORMULARY Gaba 500 mg-Take one po daily    [provider]   Dg Chest 1 View  Result Date: 12/26/2018 CLINICAL DATA:  Fall on left side EXAM: CHEST  1 VIEW COMPARISON:  None. FINDINGS: Heart size within normal limits. The lungs appear clear. No obvious acute rib discontinuity or other appreciable fracture. No blunting of the costophrenic angles. IMPRESSION: 1.  No significant abnormality identified. Electronically Signed   By: Van Clines M.D.   On: 12/26/2018 20:07   Ct Head Wo Contrast  Result Date: 12/26/2018 CLINICAL DATA:  Tripped and fell today EXAM: CT HEAD WITHOUT CONTRAST CT CERVICAL SPINE WITHOUT CONTRAST TECHNIQUE: Multidetector CT imaging of the head and cervical spine was performed following the standard protocol without intravenous contrast. Multiplanar CT image reconstructions of the cervical spine were also generated. COMPARISON:  None FINDINGS: CT HEAD FINDINGS Brain: Scattered motion artifacts. Normal ventricular morphology. No midline shift or mass effect. Normal appearance of brain parenchyma. No intracranial hemorrhage, mass lesion or evidence of acute infarction. No extra-axial fluid collections. Vascular: No hyperdense vessels. Skull: Skull appears intact. Sinuses/Orbits: Mild mucosal thickening in the maxillary sinuses. Remaining paranasal sinuses and mastoid air cells clear. Other: N/A CT CERVICAL SPINE FINDINGS Alignment: Exam significantly degraded by patient motion. Minimal retrolisthesis at C5-C6. No additional gross cervical mild alignment identified. Skull base and vertebrae: Disc space narrowing at C5-C6 and C3-C4. Endplate spur formation at C5-C6. Encroachment upon cervical neural foramina bilaterally at C5-C6 by uncovertebral and facet hypertrophy. Multilevel facet degenerative changes. Vertebral body heights appear grossly maintained. No gross fracture is identified. Prior posterior fusion of C1-C2. Uncovertebral spurs also encroach upon the LEFT C3-C4 and RIGHT C6-C7 foramina, to lesser degrees at additional  levels. Soft tissues and spinal canal: Prevertebral soft tissues grossly normal thickness Disc levels:  No additional gross abnormalities Upper chest: Tips of lung apices clear. Other: N/A IMPRESSION: No acute intracranial abnormalities. 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 Postoperative changes of posterior fusion at C1-C2. No gross acute cervical spine abnormalities are identified though the exam is severely limited secondary to patient motion, unable to remain still due to ongoing complaints of leg pain if patient has neck symptoms consider repeat imaging once the patient is able to fully cooperate with imaging. Electronically Signed   By: Lavonia Dana M.D.   On: 12/26/2018 20:57   Ct Cervical Spine Wo Contrast  Result Date: 12/26/2018 CLINICAL DATA:  Tripped and fell today EXAM: CT HEAD WITHOUT CONTRAST CT CERVICAL SPINE WITHOUT CONTRAST TECHNIQUE: Multidetector CT imaging of the head and cervical spine was performed  following the standard protocol without intravenous contrast. Multiplanar CT image reconstructions of the cervical spine were also generated. COMPARISON:  None FINDINGS: CT HEAD FINDINGS Brain: Scattered motion artifacts. Normal ventricular morphology. No midline shift or mass effect. Normal appearance of brain parenchyma. No intracranial hemorrhage, mass lesion or evidence of acute infarction. No extra-axial fluid collections. Vascular: No hyperdense vessels. Skull: Skull appears intact. Sinuses/Orbits: Mild mucosal thickening in the maxillary sinuses. Remaining paranasal sinuses and mastoid air cells clear. Other: N/A CT CERVICAL SPINE FINDINGS Alignment: Exam significantly degraded by patient motion. Minimal retrolisthesis at C5-C6. No additional gross cervical mild alignment identified. Skull base and vertebrae: Disc space narrowing at C5-C6 and C3-C4. Endplate spur formation at C5-C6. Encroachment upon cervical neural foramina bilaterally at C5-C6 by  uncovertebral and facet hypertrophy. Multilevel facet degenerative changes. Vertebral body heights appear grossly maintained. No gross fracture is identified. Prior posterior fusion of C1-C2. Uncovertebral spurs also encroach upon the LEFT C3-C4 and RIGHT C6-C7 foramina, to lesser degrees at additional levels. Soft tissues and spinal canal: Prevertebral soft tissues grossly normal thickness Disc levels:  No additional gross abnormalities Upper chest: Tips of lung apices clear. Other: N/A IMPRESSION: No acute intracranial abnormalities. 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 Postoperative changes of posterior fusion at C1-C2. No gross acute cervical spine abnormalities are identified though the exam is severely limited secondary to patient motion, unable to remain still due to ongoing complaints of leg pain if patient has neck symptoms consider repeat imaging once the patient is able to fully cooperate with imaging. Electronically Signed   By: Lavonia Dana M.D.   On: 12/26/2018 20:57   Dg Hip Unilat With Pelvis 2-3 Views Left  Result Date: 12/26/2018 CLINICAL DATA:  Fall on the left side EXAM: DG HIP (WITH OR WITHOUT PELVIS) 2-3V LEFT COMPARISON:  None. FINDINGS: Left hip reverse oblique fracture with 3.0 cm medial displacement of the distal fracture fragment, 2.4 cm of overlap, and extensive varus angulation. Severe degenerative arthropathy of the left hip. Moderate degenerative arthropathy of the right hip. IMPRESSION: 1. Considerably displaced, overlapped, and angulated reverse oblique fracture of the left hip. 2. Severe degenerative arthropathy of the left hip. Moderate degenerative arthropathy of the right hip. Electronically Signed   By: Van Clines M.D.   On: 12/26/2018 20:06    Positive ROS: All other systems have been reviewed and were otherwise negative with the exception of those mentioned in the HPI and as above.  Physical Exam: General: Alert, no  acute distress Cardiovascular: No pedal edema Respiratory: No cyanosis, no use of accessory musculature GI: No organomegaly, abdomen is soft and non-tender Skin: No lesions in the area of chief complaint Neurologic: Sensation intact distally Psychiatric: Patient is NOT competent for consent with normal mood and affect Lymphatic: No axillary or cervical lymphadenopathy  MUSCULOSKELETAL:  Left lower extremity:  Posterior knee immobilizer is in place and the leg is held in external rotation and somewhat shortened.  There are no open wounds.  He has sensation intact distally in all distributions.  He has motor intact.  2+ dorsalis pedis pulse.  No pain with passive stretch.  No part syndrome.  Assessment: Left reverse obliquity intertrochanteric hip fracture  Plan: - I discussed with the patient my plan to move forward with operative management of his left hip.  He did expose understanding.  However given his cognitive delay I did also have a separate conversation with his sister who is his healthcare power of  attorney.  We discussed moving forward with intramedullary nailing for the reverse obliquity intertrochanteric fracture.  I also did discuss with her that a arthroplasty is not a good option for this fracture pattern and that ultimately we need this to heal before he could move forward with placing a hip prosthesis.  She expressed understanding of that.  We have him tentatively scheduled for that surgery later this morning around 9:45 AM.  - I reviewed the risks, benefits, and indications of the procedure with his healthcare power of attorney.  We discussed the risk of bleeding, infection, damage to surrounding neurovascular structures, malunion, nonunion, painful hardware, need for further surgery, the development of blood clots, and the risk of anesthesia.  She has provided verbal consent and states that she will be here in the morning for written consent.    Nicholes Stairs,  MD Cell [phone]    12/26/2018 11:53 PM

## 2018-12-26 NOTE — ED Notes (Signed)
Patient transported to X-ray 

## 2018-12-26 NOTE — ED Provider Notes (Signed)
[facility] Provider Note   CSN: [id number] Arrival date & time: 12/26/18  1847     History   Chief Complaint Chief Complaint  Patient presents with  . Fall    HPI Joshua Moyer is a 58 y.o. male.  58 year old male brought in by EMS for left hip pain after a fall.  Patient states that he was going down a few wooden steps and when he got to the bottom he slipped on something on the ground causing him to fall and injured his hip.  Patient rolled over onto his stomach and crawled back to the steps and eventually was able to call for help to a passerby.  Patient's power of attorney is his sister (due to cognitive developmental delay), at bedside, states that he was gone from the house for about an hour.  Patient denies hitting his head, denies loss of consciousness, denies neck or back pain, upper extremity injury or any other injuries or complaints today.  Patient is not on a blood thinner.       Past Medical History:  Diagnosis Date  . Atrial fibrillation ([place])   . Atrial flutter ([place])   . Broken neck ([place])    In 1996  . Cognitive developmental delay   . Dysrhythmia   . GERD (gastroesophageal reflux disease)   . Hypothyroidism     Patient Active Problem List   Diagnosis Date Noted  . Appendicitis 11/04/2016  . Atrial fibrillation - chronic, rate-contreolled 08/23/2016  . Cognitive developmental delay   . Acute appendicitis with peritoneal abscess 08/22/2016  . Atrial flutter ([place]) 05/15/2010    Past Surgical History:  Procedure Laterality Date  . CARDIAC ELECTROPHYSIOLOGY STUDY AND ABLATION    . INGUINAL HERNIA REPAIR     right  . IR GENERIC HISTORICAL  09/10/2016   IR RADIOLOGIST EVAL & MGMT 09/10/2016 Corrie Mckusick, DO [facility] INTERV RAD  . LAPAROSCOPIC APPENDECTOMY N/A 11/04/2016   Procedure: APPENDECTOMY LAPAROSCOPIC;  Surgeon: Autumn Messing III, MD;  Location: [facility];  Service: General;  Laterality: N/A;  . NECK SURGERY     broke neck in [place] Medications    Prior to Admission medications   Medication Sig Start Date End Date Taking? Authorizing Provider  acetaminophen (TYLENOL) 325 MG tablet Take 2 tablets (650 mg total) by mouth every 6 (six) hours as needed for fever, headache, mild pain or moderate pain. 08/26/16   Stark Klein, MD  amoxicillin-clavulanate (AUGMENTIN) 875-125 MG tablet Take 1 tablet by mouth 2 (two) times daily. 08/26/16   Stark Klein, MD  Ascorbic Acid (VITAMIN C) 1000 MG tablet Take 1,000 mg by mouth daily.    [provider]  aspirin EC 81 MG tablet Take 81 mg by mouth daily.    [provider]  b complex vitamins tablet Take 1 tablet by mouth daily.    [provider]  cholecalciferol (VITAMIN D) 1000 units tablet Take 1,000 Units by mouth daily.    [provider]  diltiazem (TIAZAC) 120 MG 24 hr capsule Take 120 mg by mouth daily.  08/20/16   [provider]  HYDROcodone-acetaminophen (NORCO) 5-325 MG tablet Take 1-2 tablets by mouth every 4 (four) hours as needed for moderate pain. 11/04/16   Jovita Kussmaul, MD  levothyroxine (SYNTHROID, LEVOTHROID) 25 MCG tablet TAKE ONE TABLET BY MOUTH ONCE A DAY ON AN EMPTY STOMACH 07/14/16   [provider]  naproxen sodium (  ANAPROX) 220 MG tablet Take 440 mg by mouth 2 (two) times daily as needed (pain).    [provider]  NON FORMULARY Kava Kava 500 mg Take one po daily    [provider]  NON FORMULARY Hio Joint-Take one po daily    [provider]  NON FORMULARY Gaba 500 mg-Take one po daily    [provider]  vitamin E 400 UNIT capsule Take 400 Units by mouth daily.    [provider]    Family History Family History  Problem Relation Age of Onset  . Diabetes Mother   . Breast cancer Mother     Social History Social History   Tobacco Use  . Smoking status: Never Smoker  . Smokeless tobacco: Never Used  Substance Use  Topics  . Alcohol use: No  . Drug use: No     Allergies   Chlorhexidine gluconate [chlorhexidine] and Statins   Review of Systems Review of Systems  Respiratory: Negative for shortness of breath.   Cardiovascular: Negative for chest pain.  Gastrointestinal: Negative for abdominal pain.  Musculoskeletal: Positive for arthralgias. Negative for back pain and neck pain.  Skin: Negative for rash and wound.  Allergic/Immunologic: Negative for immunocompromised state.  Neurological: Negative for dizziness and headaches.  Hematological: Does not bruise/bleed easily.  Psychiatric/Behavioral: Negative for confusion.     Physical Exam Updated Vital Signs BP 129/88   Pulse (!) 112   Temp 98.4 F (36.9 C) (Oral)   Ht 6\' 5"  (1.956 m)   Wt 87.5 kg   SpO2 97%   BMI 22.89 kg/m   Physical Exam Vitals signs and nursing note reviewed.  Constitutional:      Appearance: Normal appearance. He is normal weight.  HENT:     Head: Normocephalic and atraumatic.     Nose: Nose normal.     Mouth/Throat:     Mouth: Mucous membranes are moist.  Eyes:     Conjunctiva/sclera: Conjunctivae normal.  Cardiovascular:     Pulses: Normal pulses.  Pulmonary:     Effort: Pulmonary effort is normal.  Abdominal:     Tenderness: There is no abdominal tenderness.  Musculoskeletal:        General: Tenderness, deformity and signs of injury present.     Right lower leg: No edema.     Left lower leg: No edema.     Comments: Left leg externally rotated, pain over the left hip with movement of the left leg.  Pelvis is stable nontender.  Upper extremities range of motion without pain or difficulty, no right lower extremity pain.  Pain to palpation of the left lower leg or knee, neurovascular intact.  Skin:    General: Skin is warm and dry.     Findings: No erythema or rash.  Neurological:     Mental Status: He is alert and oriented to person, place, and time. Mental status is at baseline.  Psychiatric:          Behavior: Behavior normal.      ED Treatments / Results  Labs (all labs ordered are listed, but only abnormal results are displayed) Labs Reviewed  CBC WITH DIFFERENTIAL/PLATELET  BASIC METABOLIC PANEL  SAMPLE TO BLOOD BANK    EKG None  Radiology Dg Chest 1 View  Result Date: 12/26/2018 CLINICAL DATA:  Fall on left side EXAM: CHEST  1 VIEW COMPARISON:  None. FINDINGS: Heart size within normal limits. The lungs appear clear. No obvious acute rib discontinuity or  other appreciable fracture. No blunting of the costophrenic angles. IMPRESSION: 1.  No significant abnormality identified. Electronically Signed   By: Van Clines M.D.   On: 12/26/2018 20:07   Ct Head Wo Contrast  Result Date: 12/26/2018 CLINICAL DATA:  Tripped and fell today EXAM: CT HEAD WITHOUT CONTRAST CT CERVICAL SPINE WITHOUT CONTRAST TECHNIQUE: Multidetector CT imaging of the head and cervical spine was performed following the standard protocol without intravenous contrast. Multiplanar CT image reconstructions of the cervical spine were also generated. COMPARISON:  None FINDINGS: CT HEAD FINDINGS Brain: Scattered motion artifacts. Normal ventricular morphology. No midline shift or mass effect. Normal appearance of brain parenchyma. No intracranial hemorrhage, mass lesion or evidence of acute infarction. No extra-axial fluid collections. Vascular: No hyperdense vessels. Skull: Skull appears intact. Sinuses/Orbits: Mild mucosal thickening in the maxillary sinuses. Remaining paranasal sinuses and mastoid air cells clear. Other: N/A CT CERVICAL SPINE FINDINGS Alignment: Exam significantly degraded by patient motion. Minimal retrolisthesis at C5-C6. No additional gross cervical mild alignment identified. Skull base and vertebrae: Disc space narrowing at C5-C6 and C3-C4. Endplate spur formation at C5-C6. Encroachment upon cervical neural foramina bilaterally at C5-C6 by uncovertebral and facet hypertrophy. Multilevel facet  degenerative changes. Vertebral body heights appear grossly maintained. No gross fracture is identified. Prior posterior fusion of C1-C2. Uncovertebral spurs also encroach upon the LEFT C3-C4 and RIGHT C6-C7 foramina, to lesser degrees at additional levels. Soft tissues and spinal canal: Prevertebral soft tissues grossly normal thickness Disc levels:  No additional gross abnormalities Upper chest: Tips of lung apices clear. Other: N/A IMPRESSION: No acute intracranial abnormalities. 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 Postoperative changes of posterior fusion at C1-C2. No gross acute cervical spine abnormalities are identified though the exam is severely limited secondary to patient motion, unable to remain still due to ongoing complaints of leg pain if patient has neck symptoms consider repeat imaging once the patient is able to fully cooperate with imaging. Electronically Signed   By: Lavonia Dana M.D.   On: 12/26/2018 20:57   Ct Cervical Spine Wo Contrast  Result Date: 12/26/2018 CLINICAL DATA:  Tripped and fell today EXAM: CT HEAD WITHOUT CONTRAST CT CERVICAL SPINE WITHOUT CONTRAST TECHNIQUE: Multidetector CT imaging of the head and cervical spine was performed following the standard protocol without intravenous contrast. Multiplanar CT image reconstructions of the cervical spine were also generated. COMPARISON:  None FINDINGS: CT HEAD FINDINGS Brain: Scattered motion artifacts. Normal ventricular morphology. No midline shift or mass effect. Normal appearance of brain parenchyma. No intracranial hemorrhage, mass lesion or evidence of acute infarction. No extra-axial fluid collections. Vascular: No hyperdense vessels. Skull: Skull appears intact. Sinuses/Orbits: Mild mucosal thickening in the maxillary sinuses. Remaining paranasal sinuses and mastoid air cells clear. Other: N/A CT CERVICAL SPINE FINDINGS Alignment: Exam significantly degraded by patient motion. Minimal  retrolisthesis at C5-C6. No additional gross cervical mild alignment identified. Skull base and vertebrae: Disc space narrowing at C5-C6 and C3-C4. Endplate spur formation at C5-C6. Encroachment upon cervical neural foramina bilaterally at C5-C6 by uncovertebral and facet hypertrophy. Multilevel facet degenerative changes. Vertebral body heights appear grossly maintained. No gross fracture is identified. Prior posterior fusion of C1-C2. Uncovertebral spurs also encroach upon the LEFT C3-C4 and RIGHT C6-C7 foramina, to lesser degrees at additional levels. Soft tissues and spinal canal: Prevertebral soft tissues grossly normal thickness Disc levels:  No additional gross abnormalities Upper chest: Tips of lung apices clear. Other: N/A IMPRESSION: No acute intracranial abnormalities. Multilevel degenerative  disc and facet disease changes of the cervical spine. Postoperative changes of posterior fusion at C1-C2. No gross acute cervical spine abnormalities are identified though the exam is severely limited secondary to patient motion, unable to remain still due to ongoing complaints of leg pain if patient has neck symptoms consider repeat imaging once the patient is able to fully cooperate with imaging. Electronically Signed   By: Lavonia Dana M.D.   On: 12/26/2018 20:57   Dg Hip Unilat With Pelvis 2-3 Views Left  Result Date: 12/26/2018 CLINICAL DATA:  Fall on the left side EXAM: DG HIP (WITH OR WITHOUT PELVIS) 2-3V LEFT COMPARISON:  None. FINDINGS: Left hip reverse oblique fracture with 3.0 cm medial displacement of the distal fracture fragment, 2.4 cm of overlap, and extensive varus angulation. Severe degenerative arthropathy of the left hip. Moderate degenerative arthropathy of the right hip. IMPRESSION: 1. Considerably displaced, overlapped, and angulated reverse oblique fracture of the left hip. 2. Severe degenerative arthropathy of the left hip. Moderate degenerative arthropathy of the right hip. Electronically  Signed   By: Van Clines M.D.   On: 12/26/2018 20:06    Procedures Procedures (including critical care time)  Medications Ordered in ED Medications  morphine 4 MG/ML injection 4 mg (4 mg Intravenous Given 12/26/18 2107)     Initial Impression / Assessment and Plan / ED Course  I have reviewed the triage vital signs and the nursing notes.  Pertinent labs & imaging results that were available during my care of the patient were reviewed by me and considered in my medical decision making (see chart for details).  Clinical Course as of Dec 26 2120  Sat Dec 26, 2185  371 58 year old male brought in by EMS for injuries from a fall.  Patient states he went down 2 steps into a carport area and slipped on the floor injuring his left hip.  Patient denies LOC or hitting his head.  No other injuries.  Patient does not any blood thinners.  On exam patient has externally rotated left leg with pain over his left hip, neurovascular intact.  X-ray showing left hip fracture.  Case discussed with Dr. Darl Householder, ER attending who has discussed case with orthopedist on-call who plans surgery tomorrow.  Case discussed with Dr. Alcario Drought, on-call with triad hospitalist who will admit.   [LM]    Clinical Course User Index [LM] Tacy Learn, PA-C   Final Clinical Impressions(s) / ED Diagnoses   Final diagnoses:  Closed fracture of left hip, initial encounter [facility])  Fall in home, initial encounter    ED Discharge Orders    None       Roque Lias 12/26/18 2122    Drenda Freeze, MD 12/26/18 2154

## 2018-12-26 NOTE — ED Notes (Signed)
Call pt Sister if any questions Joshua [facility]oyer [phone]. She is his POA

## 2018-12-27 ENCOUNTER — Inpatient Hospital Stay (HOSPITAL_COMMUNITY): Payer: Medicare Other | Admitting: Anesthesiology

## 2018-12-27 ENCOUNTER — Encounter (HOSPITAL_COMMUNITY): Payer: Self-pay | Admitting: Anesthesiology

## 2018-12-27 ENCOUNTER — Inpatient Hospital Stay (HOSPITAL_COMMUNITY): Payer: Medicare Other

## 2018-12-27 ENCOUNTER — Encounter (HOSPITAL_COMMUNITY)
Admission: EM | Disposition: A | Discharge status: Skilled Nursing Facility | Payer: Self-pay | Source: Home / Self Care | Attending: Family Medicine

## 2018-12-27 HISTORY — PX: INTRAMEDULLARY (IM) NAIL INTERTROCHANTERIC: SHX5875

## 2018-12-27 HISTORY — PX: FEMUR IM NAIL: SHX1597

## 2018-12-27 LAB — HIV ANTIBODY (ROUTINE TESTING W REFLEX): HIV Screen 4th Generation wRfx: NONREACTIVE

## 2018-12-27 LAB — MRSA PCR SCREENING: MRSA BY PCR: NEGATIVE

## 2018-12-27 LAB — GLUCOSE, CAPILLARY: Glucose-Capillary: 155 mg/dL — ABNORMAL HIGH (ref 70–99)

## 2018-12-27 SURGERY — FIXATION, FRACTURE, INTERTROCHANTERIC, WITH INTRAMEDULLARY ROD
Anesthesia: General | Site: Hip | Laterality: Left

## 2018-12-27 MED ORDER — POVIDONE-IODINE 10 % EX SWAB: 2.0000 "application " | Freq: Once | CUTANEOUS | Status: DC

## 2018-12-27 MED ORDER — PHENYLEPHRINE 40 MCG/ML (10ML) SYRINGE FOR IV PUSH (FOR BLOOD PRESSURE SUPPORT)
PREFILLED_SYRINGE | INTRAVENOUS | Status: AC
  Filled 2018-12-27: qty 10

## 2018-12-27 MED ORDER — METHOCARBAMOL 500 MG PO TABS
500.0000 mg | ORAL_TABLET | Freq: Four times a day (QID) | ORAL | Status: DC | PRN
  Administered 2018-12-29 (×2): 500 mg via ORAL
  Filled 2018-12-27: qty 1

## 2018-12-27 MED ORDER — ACETAMINOPHEN 500 MG PO TABS
500.0000 mg | ORAL_TABLET | Freq: Four times a day (QID) | ORAL | Status: AC
  Administered 2018-12-27 (×2): 500 mg via ORAL
  Filled 2018-12-27 (×2): qty 1

## 2018-12-27 MED ORDER — MIDAZOLAM HCL 5 MG/5ML IJ SOLN
INTRAMUSCULAR | Status: DC | PRN
  Administered 2018-12-27 (×2): 1 mg via INTRAVENOUS

## 2018-12-27 MED ORDER — FENTANYL CITRATE (PF) 250 MCG/5ML IJ SOLN
INTRAMUSCULAR | Status: AC
  Filled 2018-12-27: qty 5

## 2018-12-27 MED ORDER — CEFAZOLIN SODIUM-DEXTROSE 2-4 GM/100ML-% IV SOLN
2.0000 g | INTRAVENOUS | Status: AC
  Administered 2018-12-27: 2 g via INTRAVENOUS
  Filled 2018-12-27 (×2): qty 100

## 2018-12-27 MED ORDER — ASPIRIN 81 MG PO CHEW
81.0000 mg | CHEWABLE_TABLET | Freq: Two times a day (BID) | ORAL | Status: DC
  Administered 2018-12-27 – 2018-12-30 (×6): 81 mg via ORAL
  Filled 2018-12-27 (×6): qty 1

## 2018-12-27 MED ORDER — DEXAMETHASONE SODIUM PHOSPHATE 10 MG/ML IJ SOLN
INTRAMUSCULAR | Status: AC
  Filled 2018-12-27: qty 1

## 2018-12-27 MED ORDER — METOCLOPRAMIDE HCL 5 MG PO TABS: 5.0000 mg | ORAL_TABLET | Freq: Three times a day (TID) | ORAL | Status: DC | PRN

## 2018-12-27 MED ORDER — PROPOFOL 10 MG/ML IV BOLUS
INTRAVENOUS | Status: DC | PRN
  Administered 2018-12-27: 150 mg via INTRAVENOUS

## 2018-12-27 MED ORDER — MEPERIDINE HCL 50 MG/ML IJ SOLN: 6.2500 mg | INTRAMUSCULAR | Status: DC | PRN

## 2018-12-27 MED ORDER — SENNOSIDES-DOCUSATE SODIUM 8.6-50 MG PO TABS
2.0000 | ORAL_TABLET | Freq: Two times a day (BID) | ORAL | Status: DC
  Administered 2018-12-27 – 2018-12-30 (×7): 2 via ORAL
  Filled 2018-12-27 (×7): qty 2

## 2018-12-27 MED ORDER — CHLORHEXIDINE GLUCONATE 4 % EX LIQD: 60.0000 mL | Freq: Once | CUTANEOUS | Status: DC

## 2018-12-27 MED ORDER — FENTANYL CITRATE (PF) 100 MCG/2ML IJ SOLN: 25.0000 ug | INTRAMUSCULAR | Status: DC | PRN

## 2018-12-27 MED ORDER — FENTANYL CITRATE (PF) 250 MCG/5ML IJ SOLN
INTRAMUSCULAR | Status: DC | PRN
  Administered 2018-12-27 (×2): 50 ug via INTRAVENOUS

## 2018-12-27 MED ORDER — LIDOCAINE 2% (20 MG/ML) 5 ML SYRINGE
INTRAMUSCULAR | Status: AC
  Filled 2018-12-27: qty 5

## 2018-12-27 MED ORDER — LACTATED RINGERS IV SOLN: INTRAVENOUS | Status: DC

## 2018-12-27 MED ORDER — FENTANYL CITRATE (PF) 100 MCG/2ML IJ SOLN
INTRAMUSCULAR | Status: AC
  Administered 2018-12-27: 50 ug via INTRAVENOUS
  Filled 2018-12-27: qty 2

## 2018-12-27 MED ORDER — ONDANSETRON HCL 4 MG PO TABS: 4.0000 mg | ORAL_TABLET | Freq: Four times a day (QID) | ORAL | Status: DC | PRN

## 2018-12-27 MED ORDER — ONDANSETRON HCL 4 MG/2ML IJ SOLN
INTRAMUSCULAR | Status: AC
  Filled 2018-12-27: qty 2

## 2018-12-27 MED ORDER — METOCLOPRAMIDE HCL 5 MG/ML IJ SOLN: 5.0000 mg | Freq: Three times a day (TID) | INTRAMUSCULAR | Status: DC | PRN

## 2018-12-27 MED ORDER — HYDROCODONE-ACETAMINOPHEN 7.5-325 MG PO TABS
1.0000 | ORAL_TABLET | ORAL | Status: DC | PRN
  Administered 2018-12-29 (×2): 2 via ORAL
  Filled 2018-12-27 (×2): qty 2

## 2018-12-27 MED ORDER — ROCURONIUM BROMIDE 50 MG/5ML IV SOSY
PREFILLED_SYRINGE | INTRAVENOUS | Status: DC | PRN
  Administered 2018-12-27: 40 mg via INTRAVENOUS

## 2018-12-27 MED ORDER — ONDANSETRON HCL 4 MG/2ML IJ SOLN: 4.0000 mg | Freq: Four times a day (QID) | INTRAMUSCULAR | Status: DC | PRN

## 2018-12-27 MED ORDER — DEXAMETHASONE SODIUM PHOSPHATE 10 MG/ML IJ SOLN
INTRAMUSCULAR | Status: DC | PRN
  Administered 2018-12-27: 10 mg via INTRAVENOUS

## 2018-12-27 MED ORDER — PHENYLEPHRINE 40 MCG/ML (10ML) SYRINGE FOR IV PUSH (FOR BLOOD PRESSURE SUPPORT)
PREFILLED_SYRINGE | INTRAVENOUS | Status: DC | PRN
  Administered 2018-12-27: 120 ug via INTRAVENOUS
  Administered 2018-12-27: 80 ug via INTRAVENOUS
  Administered 2018-12-27: 120 ug via INTRAVENOUS
  Administered 2018-12-27: 80 ug via INTRAVENOUS

## 2018-12-27 MED ORDER — ACETAMINOPHEN 325 MG PO TABS: 325.0000 mg | ORAL_TABLET | Freq: Four times a day (QID) | ORAL | Status: DC | PRN

## 2018-12-27 MED ORDER — ONDANSETRON HCL 4 MG/2ML IJ SOLN
INTRAMUSCULAR | Status: DC | PRN
  Administered 2018-12-27: 4 mg via INTRAVENOUS

## 2018-12-27 MED ORDER — TAMSULOSIN HCL 0.4 MG PO CAPS
0.4000 mg | ORAL_CAPSULE | Freq: Every day | ORAL | Status: DC
  Administered 2018-12-27 – 2018-12-29 (×3): 0.4 mg via ORAL
  Filled 2018-12-27 (×3): qty 1

## 2018-12-27 MED ORDER — DOCUSATE SODIUM 100 MG PO CAPS
100.0000 mg | ORAL_CAPSULE | Freq: Two times a day (BID) | ORAL | Status: DC
  Administered 2018-12-27: 100 mg via ORAL
  Filled 2018-12-27: qty 1

## 2018-12-27 MED ORDER — SODIUM CHLORIDE 0.9 % IV SOLN
INTRAVENOUS | Status: DC | PRN
  Administered 2018-12-27: 50 ug/min via INTRAVENOUS

## 2018-12-27 MED ORDER — 0.9 % SODIUM CHLORIDE (POUR BTL) OPTIME
TOPICAL | Status: DC | PRN
  Administered 2018-12-27: 1000 mL

## 2018-12-27 MED ORDER — LACTATED RINGERS IV SOLN
INTRAVENOUS | Status: DC | PRN
  Administered 2018-12-27: 10:00:00 via INTRAVENOUS

## 2018-12-27 MED ORDER — METHOCARBAMOL 1000 MG/10ML IJ SOLN
500.0000 mg | Freq: Four times a day (QID) | INTRAVENOUS | Status: DC | PRN
  Filled 2018-12-27: qty 5

## 2018-12-27 MED ORDER — PROPOFOL 10 MG/ML IV BOLUS
INTRAVENOUS | Status: AC
  Filled 2018-12-27: qty 20

## 2018-12-27 MED ORDER — PROMETHAZINE HCL 25 MG/ML IJ SOLN: 6.2500 mg | INTRAMUSCULAR | Status: DC | PRN

## 2018-12-27 MED ORDER — MIDAZOLAM HCL 2 MG/2ML IJ SOLN
INTRAMUSCULAR | Status: AC
  Filled 2018-12-27: qty 2

## 2018-12-27 MED ORDER — MORPHINE SULFATE (PF) 2 MG/ML IV SOLN: 0.5000 mg | INTRAVENOUS | Status: DC | PRN

## 2018-12-27 MED ORDER — SUGAMMADEX SODIUM 200 MG/2ML IV SOLN
INTRAVENOUS | Status: DC | PRN
  Administered 2018-12-27: 200 mg via INTRAVENOUS

## 2018-12-27 MED ORDER — HYDROCODONE-ACETAMINOPHEN 5-325 MG PO TABS
1.0000 | ORAL_TABLET | ORAL | Status: DC | PRN
  Administered 2018-12-29 – 2018-12-30 (×3): 2 via ORAL
  Filled 2018-12-27 (×2): qty 2

## 2018-12-27 MED ORDER — LIDOCAINE 2% (20 MG/ML) 5 ML SYRINGE
INTRAMUSCULAR | Status: DC | PRN
  Administered 2018-12-27: 60 mg via INTRAVENOUS

## 2018-12-27 SURGICAL SUPPLY — 38 items
ALCOHOL 70% 16 OZ (MISCELLANEOUS) ×2 IMPLANT
BIT DRILL FLUTED FEMUR 4.2/3 (BIT) ×1 IMPLANT
BNDG COHESIVE 6X5 TAN STRL LF (GAUZE/BANDAGES/DRESSINGS) ×5 IMPLANT
CANISTER SUCTION WELLS/JOHNSON (MISCELLANEOUS) ×2 IMPLANT
COVER PERINEAL POST (MISCELLANEOUS) ×2 IMPLANT
COVER SURGICAL LIGHT HANDLE (MISCELLANEOUS) ×2 IMPLANT
COVER WAND RF STERILE (DRAPES) ×2 IMPLANT
DRAPE HALF SHEET 40X57 (DRAPES) IMPLANT
DRAPE INCISE IOBAN 66X45 STRL (DRAPES) ×2 IMPLANT
DRAPE STERI IOBAN 125X83 (DRAPES) ×2 IMPLANT
DRSG ADAPTIC 3X8 NADH LF (GAUZE/BANDAGES/DRESSINGS) ×1 IMPLANT
DURAPREP 26ML APPLICATOR (WOUND CARE) ×2 IMPLANT
ELECT CAUTERY BLADE 6.4 (BLADE) ×2 IMPLANT
ELECT REM PT RETURN 9FT ADLT (ELECTROSURGICAL) ×2
ELECTRODE REM PT RTRN 9FT ADLT (ELECTROSURGICAL) ×1 IMPLANT
GAUZE SPONGE 4X4 12PLY STRL LF (GAUZE/BANDAGES/DRESSINGS) ×2 IMPLANT
GAUZE XEROFORM 5X9 LF (GAUZE/BANDAGES/DRESSINGS) ×1 IMPLANT
GLOVE BIO SURGEON STRL SZ7.5 (GLOVE) ×3 IMPLANT
GLOVE BIOGEL PI IND STRL 8 (GLOVE) ×1 IMPLANT
GLOVE BIOGEL PI INDICATOR 8 (GLOVE) ×1
GOWN STRL REUS W/ TWL LRG LVL3 (GOWN DISPOSABLE) ×1 IMPLANT
GOWN STRL REUS W/ TWL XL LVL3 (GOWN DISPOSABLE) ×1 IMPLANT
GOWN STRL REUS W/TWL LRG LVL3 (GOWN DISPOSABLE) ×4
GOWN STRL REUS W/TWL XL LVL3 (GOWN DISPOSABLE) ×2
GUIDEWIRE 3.2X400 (WIRE) ×1 IMPLANT
KIT BASIN OR (CUSTOM PROCEDURE TRAY) ×2 IMPLANT
KIT TURNOVER KIT B (KITS) ×2 IMPLANT
NAIL TROCH FIX 10X235 LT 130 (Nail) ×1 IMPLANT
NS IRRIG 1000ML POUR BTL (IV SOLUTION) ×2 IMPLANT
PACK GENERAL/GYN (CUSTOM PROCEDURE TRAY) ×2 IMPLANT
PAD ARMBOARD 7.5X6 YLW CONV (MISCELLANEOUS) ×7 IMPLANT
SCREW FENES TFNA 110 (Screw) ×1 IMPLANT
SCREW LOCK STAR 5X36 (Screw) ×1 IMPLANT
STAPLER VISISTAT 35W (STAPLE) ×2 IMPLANT
SUT MON AB 2-0 CT1 36 (SUTURE) ×2 IMPLANT
TOWEL OR 17X24 6PK STRL BLUE (TOWEL DISPOSABLE) ×2 IMPLANT
TOWEL OR 17X26 10 PK STRL BLUE (TOWEL DISPOSABLE) ×2 IMPLANT
WATER STERILE IRR 1000ML POUR (IV SOLUTION) ×2 IMPLANT

## 2018-12-27 NOTE — Transfer of Care (Signed)
Immediate Anesthesia Transfer of Care Note  Patient: Joshua Moyer  Procedure(s) Performed: INTRAMEDULLARY (IM) NAIL, LEFT HIP (Left Leg Upper)  Patient Location: PACU  Anesthesia Type:General  Level of Consciousness: awake, alert  and oriented  Airway & Oxygen Therapy: Patient Spontanous Breathing  Post-op Assessment: Report given to RN, Post -op Vital signs reviewed and stable and Patient moving all extremities X 4  Post vital signs: Reviewed and stable  Last Vitals:  Vitals Value Taken Time  BP 103/68 12/27/2018 11:40 AM  Temp 36.5 C 12/27/2018 11:40 AM  Pulse 93 12/27/2018 11:40 AM  Resp 16 12/27/2018 11:40 AM  SpO2 93 % 12/27/2018 11:40 AM    Last Pain:  Vitals:   12/27/18 0526  TempSrc: Oral  PainSc:          Complications: No apparent anesthesia complications

## 2018-12-27 NOTE — Progress Notes (Signed)
Pt still has not voided since I/O cath this AM at 8. Bladder scan showed only 8ml, will notify MD and continue to monitor

## 2018-12-27 NOTE — Anesthesia Postprocedure Evaluation (Signed)
Anesthesia Post Note  Patient: Joshua Moyer  Procedure(s) Performed: INTRAMEDULLARY (IM) NAIL, LEFT HIP (Left Hip)     Patient location during evaluation: PACU Anesthesia Type: General Level of consciousness: awake and alert Pain management: pain level controlled Vital Signs Assessment: post-procedure vital signs reviewed and stable Respiratory status: spontaneous breathing, nonlabored ventilation, respiratory function stable and patient connected to nasal cannula oxygen Cardiovascular status: blood pressure returned to baseline and stable Postop Assessment: no apparent nausea or vomiting Anesthetic complications: no    Last Vitals:  Vitals:   12/27/18 1155 12/27/18 1210  BP: 106/71 108/79  Pulse: 95 99  Resp: 15 18  Temp:  36.6 C  SpO2: 93% 97%    Last Pain:  Vitals:   12/27/18 1210  TempSrc:   PainSc: 0-No pain                 Effie Berkshire

## 2018-12-27 NOTE — Op Note (Signed)
Date of Surgery: 12/27/2018  INDICATIONS: Mr. Herzberg is a 58 y.o.-year-old male who sustained a left reverse obliquity intertrochanteric hip fracture. The risks and benefits of the procedure discussed with the patient and his health care power of attorney. prior to the procedure and all questions were answered; consent was obtained.  PREOPERATIVE DIAGNOSIS: left reverse obliquity intertrochanteric hip fracture   POSTOPERATIVE DIAGNOSIS: Same   PROCEDURE: Treatment of intertrochanteric, pertrochanteric, subtrochanteric fracture with intramedullary implant. CPT [phone]   SURGEON: Dannielle Karvonen. Stann Mainland, M.D.   ANESTHESIA: general   IV FLUIDS AND URINE: See anesthesia record   ESTIMATED BLOOD LOSS:  50 cc   IMPLANTS:   Synthes 10 x 235 mm 105 mm proximal compression screw 36 mm x 5.0 mm distal interlock  DRAINS: None.   COMPLICATIONS: None.   DESCRIPTION OF PROCEDURE: The patient was brought to the operating room and placed supine on the operating table. The patient's leg had been signed prior to the procedure. The patient had the anesthesia placed by the anesthesiologist. The prep verification and incision time-outs were performed to confirm that this was the correct patient, site, side and location. The patient had an SCD on the opposite lower extremity. The patient did receive antibiotics prior to the incision and was re-dosed during the procedure as needed at indicated intervals. The patient was positioned on the fracture table with the table in traction and internal rotation to reduce the hip. The well leg was placed in a scissor position and all bony prominences were well-padded. The patient had the lower extremity prepped and draped in the standard surgical fashion. The incision was made 4 finger breadths superior to the greater trochanter. A guide pin was inserted into the tip of the greater trochanter under fluoroscopic guidance. An opening reamer was used to gain access to the femoral  canal. The nail length was measured and inserted down the femoral canal to its proper depth. The appropriate version of insertion for the lag screw was found under fluoroscopy. A pin was inserted up the femoral neck through the jig. The length of the lag screw was then measured. The lag screw was inserted as near to center-center in the head as possible. The leg was taken out of traction.   We next turned our attention to the distal interlocking screw.  This was placed through the drill guide of the nail inserter.  A small incision was made overlying the lateral thigh at the screw site, and a tonsil was used to disect down to bone.  A drill pass was made through the jig and across the nail through both cortices.  This was measured, and the appropriate screw was placed under hand power and found to have good bite.    The wound was copiously irrigated with saline and the subcutaneous layer closed with 2.0 monocryl and the skin was reapproximated with staples. The wounds were cleaned and dried a final time and a sterile dressing was placed. The hip was taken through a range of motion at the end of the case under fluoroscopic imaging to visualize the approach-withdraw phenomenon and confirm implant length in the head. The patient was then awakened from anesthesia and taken to the recovery room in stable condition. All counts were correct at the end of the case.   POSTOPERATIVE PLAN: The patient will be weight bearing as tolerated and will return in 2 weeks for staple removal and the patient will receive DVT prophylaxis based on other medications, activity level, and  risk ratio of bleeding to thrombosis.  We will recommend 81 mg asa bid x 6 weeks for DVT ppx.     Geralynn Rile, MD [facility] [place] [phone] 11:32 AM

## 2018-12-27 NOTE — Brief Op Note (Signed)
12/27/2018  11:31 AM  PATIENT:  Joshua Moyer  58 y.o. male  PRE-OPERATIVE DIAGNOSIS:  Left hip fracture  POST-OPERATIVE DIAGNOSIS:  Left hip fracture  PROCEDURE:  Procedure(s): INTRAMEDULLARY (IM) NAIL, LEFT HIP (Left)  SURGEON:  Surgeon(s) and Role:    * Nicholes Stairs, MD - Primary  PHYSICIAN ASSISTANT:   ASSISTANTS: none   ANESTHESIA:   general  EBL:  50 mL   BLOOD ADMINISTERED:none  DRAINS: none   LOCAL MEDICATIONS USED:  NONE  SPECIMEN:  No Specimen  DISPOSITION OF SPECIMEN:  N/A  COUNTS:  YES  TOURNIQUET:  * No tourniquets in log *  DICTATION: .Note written in EPIC  PLAN OF CARE: Admit to inpatient   PATIENT DISPOSITION:  PACU - hemodynamically stable.   Delay start of Pharmacological VTE agent (>24hrs) due to surgical blood loss or risk of bleeding: not applicable

## 2018-12-27 NOTE — H&P (Signed)
H&P update  The surgical history has been reviewed and remains accurate without interval change.  The patient was re-examined and patient's physiologic condition has not changed significantly in the last 30 days. The condition still exists that makes this procedure necessary. The treatment plan remains the same, without new options for care.  No new pharmacological allergies or types of therapy has been initiated that would change the plan or the appropriateness of the plan.  The patient and/or family understand the potential benefits and risks.  Jason P. Stann Mainland, MD 12/27/2018 9:49 AM

## 2018-12-27 NOTE — Plan of Care (Signed)
  Problem: Pain Management: Goal: Pain level will decrease Outcome: Progressing   Problem: Nutrition: Goal: Adequate nutrition will be maintained Outcome: Progressing   Problem: Activity: Goal: Ability to ambulate and perform ADLs will improve Outcome: Progressing   Problem: Coping: Goal: Level of anxiety will decrease Outcome: Progressing   Problem: Elimination: Goal: Will not experience complications related to urinary retention Outcome: Progressing

## 2018-12-27 NOTE — Anesthesia Procedure Notes (Signed)
Procedure Name: Intubation Date/Time: 12/27/2018 10:38 AM Performed by: Harden Mo, CRNA Pre-anesthesia Checklist: Patient identified, Emergency Drugs available, Suction available and Patient being monitored Patient Re-evaluated:Patient Re-evaluated prior to induction Oxygen Delivery Method: Circle System Utilized Preoxygenation: Pre-oxygenation with 100% oxygen Induction Type: IV induction Ventilation: Mask ventilation without difficulty Laryngoscope Size: Miller and 2 Grade View: Grade I Tube type: Oral Tube size: 8.0 mm Number of attempts: 1 Airway Equipment and Method: Stylet and Oral airway Placement Confirmation: ETT inserted through vocal cords under direct vision,  positive ETCO2 and breath sounds checked- equal and bilateral Secured at: 24 cm Tube secured with: Tape Dental Injury: Teeth and Oropharynx as per pre-operative assessment

## 2018-12-27 NOTE — Anesthesia Preprocedure Evaluation (Addendum)
Anesthesia Evaluation  Patient identified by MRN, date of birth, ID band Patient awake    Reviewed: Allergy & Precautions, NPO status , Patient's Chart, lab work & pertinent test results  Airway Mallampati: I  TM Distance: >3 FB Neck ROM: Full    Dental  (+) Teeth Intact, Dental Advisory Given   Pulmonary neg pulmonary ROS,    Pulmonary exam normal        Cardiovascular + dysrhythmias Atrial Fibrillation  Rhythm:Irregular Rate:Normal     Neuro/Psych negative neurological ROS  negative psych ROS   GI/Hepatic Neg liver ROS, GERD  ,  Endo/Other  Hypothyroidism   Renal/GU negative Renal ROS     Musculoskeletal negative musculoskeletal ROS (+)   Abdominal Normal abdominal exam  (+)   Peds  Hematology   Anesthesia Other Findings   Reproductive/Obstetrics                             Anesthesia Physical Anesthesia Plan  ASA: III  Anesthesia Plan: General   Post-op Pain Management:    Induction: Intravenous  PONV Risk Score and Plan: 3 and Ondansetron, Dexamethasone and Midazolam  Airway Management Planned: Oral ETT  Additional Equipment: None  Intra-op Plan:   Post-operative Plan: Extubation in OR  Informed Consent: I have reviewed the patients History and Physical, chart, labs and discussed the procedure including the risks, benefits and alternatives for the proposed anesthesia with the patient or authorized representative who has indicated his/her understanding and acceptance.   Dental advisory given  Plan Discussed with: CRNA  Anesthesia Plan Comments:         Anesthesia Quick Evaluation

## 2018-12-27 NOTE — Progress Notes (Addendum)
Patient Demographics:    Joshua Moyer, is a 58 y.o. male, DOB - 04-Apr-1961, JEH:[id number]  Admit date - 12/26/2018   Admitting Physician Etta Quill, DO  Outpatient Primary MD for the patient is Hulan Fess, MD  LOS - 1   Chief Complaint  Patient presents with  . Fall        Subjective:    Joshua Moyer today has no fevers, no emesis,  No chest pain, postop somewhat sleepy  Assessment  & Plan :    Principal Problem:   Closed left hip fracture [facility]) Active Problems:   Cognitive developmental delay   Atrial fibrillation - chronic, rate-contreolled  Brief summary 58 y.o. male with medical history significant of A.Fib  on ASA 81,   developmental delay.    Admitted on 12/26/2018 with severe L hip pain following a fall.  Going down a few wooden steps, slipped, fell, landed on L hip, status post ORIF on 12/27/2018  Plan:-  1)Lt HIP Fx-- s/p ORIF on 12/27/18, WBAT, follow instructions by Ortho team  2) chronic A. Fib--- not on anticoagulation (CHADs score= 0)..., Continue aspirin  3) developmental delay with baseline cognitive deficits--- supportive care  4) urinary retention--- Flomax as prescribed, patient needed in and out Foley with 1600 mL of urine removed may need indwelling Foley  5) hypothyroidism--- continue levothyroxine 25 mcg daily  Code Status : Full  Family Communication:   None at bedside   Disposition Plan  : TBD, await PT OT eval  Consults  :  ortho  DVT Prophylaxis  : Aspirin 81 mg twice daily as per orthopedic team SCDs   Lab Results  Component Value Date   PLT 220 12/26/2018    Inpatient Medications  Scheduled Meds: . acetaminophen  500 mg Oral Q6H  . diltiazem  120 mg Oral Daily  . levothyroxine  25 mcg Oral QAC breakfast  . senna-docusate  2 tablet Oral BID  . tamsulosin  0.4 mg Oral QPC supper   Continuous Infusions: . sodium chloride 100 mL/hr at  12/27/18 0001  . methocarbamol (ROBAXIN) IV    . methocarbamol (ROBAXIN) IV     PRN Meds:.[START ON 12/28/2018] acetaminophen, fentaNYL (SUBLIMAZE) injection, HYDROcodone-acetaminophen, HYDROcodone-acetaminophen, HYDROcodone-acetaminophen, methocarbamol **OR** methocarbamol (ROBAXIN) IV, methocarbamol **OR** methocarbamol (ROBAXIN) IV, metoCLOPramide **OR** metoCLOPramide (REGLAN) injection, morphine injection, ondansetron **OR** ondansetron (ZOFRAN) IV    Anti-infectives (From admission, onward)   Start     Dose/Rate Route Frequency Ordered Stop   12/27/18 0745  ceFAZolin (ANCEF) IVPB 2g/100 mL premix     2 g 200 mL/hr over 30 Minutes Intravenous On call to O.R. 12/27/18 0734 12/27/18 1050        Objective:   Vitals:   12/27/18 1140 12/27/18 1155 12/27/18 1210 12/27/18 1241  BP: 103/68 106/71 108/79 104/81  Pulse: 93 95 99 98  Resp: 16 15 18 16   Temp: 97.7 F (36.5 C)  97.8 F (36.6 C) (!) 97.5 F (36.4 C)  TempSrc:    Oral  SpO2: 93% 93% 97% 96%  Weight:      Height:        Wt Readings from Last 3 Encounters:  12/26/18 87.5 kg  11/04/16 89.6 kg  10/29/16 (P) 89.6 kg  Intake/Output Summary (Last 24 hours) at 12/27/2018 1453 Last data filed at 12/27/2018 1124 Gross per 24 hour  Intake 998.33 ml  Output 1700 ml  Net -701.67 ml     Physical Exam Patient is examined daily including today on 12/27/18 , exams remain the same as of yesterday except that has changed   Gen:- Awake Alert, no acute distress HEENT:- [place].AT, No sclera icterus Neck-Supple Neck,No JVD,.  Lungs-  CTAB , fair symmetrical air movement CV- S1, S2 normal, regular  Abd-  +ve B.Sounds, Abd Soft, No tenderness,    Extremity/Skin:- No  edema, pedal pulses present  Psych-affect is appropriate, oriented x3 Neuro-no new focal deficits, no tremors MSK--left hip post op site clean dressing  Data Review:   Micro Results Recent Results (from the past 240 hour(s))  MRSA PCR Screening     Status: None    Collection Time: 12/27/18  9:13 AM  Result Value Ref Range Status   MRSA by PCR NEGATIVE NEGATIVE Final    Comment:        The GeneXpert MRSA Assay (FDA approved for NASAL specimens only), is one component of a comprehensive MRSA colonization surveillance program. It is not intended to diagnose MRSA infection nor to guide or monitor treatment for MRSA infections. Performed at [facility], [place] [address] [place], [place] [address]     Radiology Reports Dg Chest 1 View  Result Date: 12/26/2018 CLINICAL DATA:  Fall on left side EXAM: CHEST  1 VIEW COMPARISON:  None. FINDINGS: Heart size within normal limits. The lungs appear clear. No obvious acute rib discontinuity or other appreciable fracture. No blunting of the costophrenic angles. IMPRESSION: 1.  No significant abnormality identified. Electronically Signed   By: Van Clines M.D.   On: 12/26/2018 20:07   Ct Head Wo Contrast  Result Date: 12/26/2018 CLINICAL DATA:  Tripped and fell today EXAM: CT HEAD WITHOUT CONTRAST CT CERVICAL SPINE WITHOUT CONTRAST TECHNIQUE: Multidetector CT imaging of the head and cervical spine was performed following the standard protocol without intravenous contrast. Multiplanar CT image reconstructions of the cervical spine were also generated. COMPARISON:  None FINDINGS: CT HEAD FINDINGS Brain: Scattered motion artifacts. Normal ventricular morphology. No midline shift or mass effect. Normal appearance of brain parenchyma. No intracranial hemorrhage, mass lesion or evidence of acute infarction. No extra-axial fluid collections. Vascular: No hyperdense vessels. Skull: Skull appears intact. Sinuses/Orbits: Mild mucosal thickening in the maxillary sinuses. Remaining paranasal sinuses and mastoid air cells clear. Other: N/A CT CERVICAL SPINE FINDINGS Alignment: Exam significantly degraded by patient motion. Minimal retrolisthesis at C5-C6. No additional gross cervical mild alignment identified. Skull base  and vertebrae: Disc space narrowing at C5-C6 and C3-C4. Endplate spur formation at C5-C6. Encroachment upon cervical neural foramina bilaterally at C5-C6 by uncovertebral and facet hypertrophy. Multilevel facet degenerative changes. Vertebral body heights appear grossly maintained. No gross fracture is identified. Prior posterior fusion of C1-C2. Uncovertebral spurs also encroach upon the LEFT C3-C4 and RIGHT C6-C7 foramina, to lesser degrees at additional levels. Soft tissues and spinal canal: Prevertebral soft tissues grossly normal thickness Disc levels:  No additional gross abnormalities Upper chest: Tips of lung apices clear. Other: N/A IMPRESSION: No acute intracranial abnormalities. 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 Postoperative changes of posterior fusion at C1-C2. No gross acute cervical spine abnormalities are identified though the exam is severely limited secondary to patient motion, unable to remain still due to ongoing complaints of leg pain if patient  has neck symptoms consider repeat imaging once the patient is able to fully cooperate with imaging. Electronically Signed   By: Lavonia Dana M.D.   On: 12/26/2018 20:57   Ct Cervical Spine Wo Contrast  Result Date: 12/26/2018 CLINICAL DATA:  Tripped and fell today EXAM: CT HEAD WITHOUT CONTRAST CT CERVICAL SPINE WITHOUT CONTRAST TECHNIQUE: Multidetector CT imaging of the head and cervical spine was performed following the standard protocol without intravenous contrast. Multiplanar CT image reconstructions of the cervical spine were also generated. COMPARISON:  None FINDINGS: CT HEAD FINDINGS Brain: Scattered motion artifacts. Normal ventricular morphology. No midline shift or mass effect. Normal appearance of brain parenchyma. No intracranial hemorrhage, mass lesion or evidence of acute infarction. No extra-axial fluid collections. Vascular: No hyperdense vessels. Skull: Skull appears intact. Sinuses/Orbits:  Mild mucosal thickening in the maxillary sinuses. Remaining paranasal sinuses and mastoid air cells clear. Other: N/A CT CERVICAL SPINE FINDINGS Alignment: Exam significantly degraded by patient motion. Minimal retrolisthesis at C5-C6. No additional gross cervical mild alignment identified. Skull base and vertebrae: Disc space narrowing at C5-C6 and C3-C4. Endplate spur formation at C5-C6. Encroachment upon cervical neural foramina bilaterally at C5-C6 by uncovertebral and facet hypertrophy. Multilevel facet degenerative changes. Vertebral body heights appear grossly maintained. No gross fracture is identified. Prior posterior fusion of C1-C2. Uncovertebral spurs also encroach upon the LEFT C3-C4 and RIGHT C6-C7 foramina, to lesser degrees at additional levels. Soft tissues and spinal canal: Prevertebral soft tissues grossly normal thickness Disc levels:  No additional gross abnormalities Upper chest: Tips of lung apices clear. Other: N/A IMPRESSION: No acute intracranial abnormalities. 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 Postoperative changes of posterior fusion at C1-C2. No gross acute cervical spine abnormalities are identified though the exam is severely limited secondary to patient motion, unable to remain still due to ongoing complaints of leg pain if patient has neck symptoms consider repeat imaging once the patient is able to fully cooperate with imaging. Electronically Signed   By: Lavonia Dana M.D.   On: 12/26/2018 20:57   Dg C-arm 1-60 Min  Result Date: 12/27/2018 CLINICAL DATA:  The patient suffered a left intertrochanteric fracture in a fall 12/26/2018. Intraoperative imaging for fracture fixation. Initial encounter. EXAM: DG HIP (WITH OR WITHOUT PELVIS) 2-3V LEFT; DG C-ARM 61-120 MIN COMPARISON:  Plain films left hip 12/26/2018. FINDINGS: Four fluoroscopic intraoperative spot views demonstrate placement of a compression hip screw and short intramedullary nail  with a single screw for fixation of an intertrochanteric fracture. Hardware is intact. Position and alignment of the patient's fracture are markedly improved. No acute abnormality. IMPRESSION: Intraoperative imaging for fixation of a left intertrochanteric fracture. No acute abnormality. Electronically Signed   By: Inge Rise M.D.   On: 12/27/2018 11:55   Dg Hip Unilat With Pelvis 2-3 Views Left  Result Date: 12/27/2018 CLINICAL DATA:  The patient suffered a left intertrochanteric fracture in a fall 12/26/2018. Intraoperative imaging for fracture fixation. Initial encounter. EXAM: DG HIP (WITH OR WITHOUT PELVIS) 2-3V LEFT; DG C-ARM 61-120 MIN COMPARISON:  Plain films left hip 12/26/2018. FINDINGS: Four fluoroscopic intraoperative spot views demonstrate placement of a compression hip screw and short intramedullary nail with a single screw for fixation of an intertrochanteric fracture. Hardware is intact. Position and alignment of the patient's fracture are markedly improved. No acute abnormality. IMPRESSION: Intraoperative imaging for fixation of a left intertrochanteric fracture. No acute abnormality. Electronically Signed   By: Inge Rise M.D.   On: 12/27/2018  11:55   Dg Hip Unilat With Pelvis 2-3 Views Left  Result Date: 12/26/2018 CLINICAL DATA:  Fall on the left side EXAM: DG HIP (WITH OR WITHOUT PELVIS) 2-3V LEFT COMPARISON:  None. FINDINGS: Left hip reverse oblique fracture with 3.0 cm medial displacement of the distal fracture fragment, 2.4 cm of overlap, and extensive varus angulation. Severe degenerative arthropathy of the left hip. Moderate degenerative arthropathy of the right hip. IMPRESSION: 1. Considerably displaced, overlapped, and angulated reverse oblique fracture of the left hip. 2. Severe degenerative arthropathy of the left hip. Moderate degenerative arthropathy of the right hip. Electronically Signed   By: Van Clines M.D.   On: 12/26/2018 20:06     CBC Recent Labs    Lab 12/26/18 2133  WBC 10.4  HGB 14.7  HCT 41.8  PLT 220  MCV 97.4  MCH 34.3*  MCHC 35.2  RDW 12.0  LYMPHSABS 0.8  MONOABS 0.8  EOSABS 0.0  BASOSABS 0.0    Chemistries  Recent Labs  Lab 12/26/18 2133  NA 133*  K 3.5  CL 102  CO2 23  GLUCOSE 106*  BUN 18  CREATININE 0.83  CALCIUM 8.8*    Maddilynn Esperanza M.D on 12/27/2018 at 2:53 PM  Pager---[phone] Go to [email or web address] - password [email or web address] for contact info  [facility] - Office  [phone]

## 2018-12-28 ENCOUNTER — Encounter (HOSPITAL_COMMUNITY): Payer: Self-pay | Admitting: General Practice

## 2018-12-28 LAB — CBC
HCT: 37.8 % — ABNORMAL LOW (ref 39.0–52.0)
Hemoglobin: 13 g/dL (ref 13.0–17.0)
MCH: 33.8 pg (ref 26.0–34.0)
MCHC: 34.4 g/dL (ref 30.0–36.0)
MCV: 98.2 fL (ref 80.0–100.0)
PLATELETS: 201 10*3/uL (ref 150–400)
RBC: 3.85 MIL/uL — ABNORMAL LOW (ref 4.22–5.81)
RDW: 12 % (ref 11.5–15.5)
WBC: 11.3 10*3/uL — ABNORMAL HIGH (ref 4.0–10.5)
nRBC: 0 % (ref 0.0–0.2)

## 2018-12-28 LAB — BASIC METABOLIC PANEL
Anion gap: 7 (ref 5–15)
BUN: 10 mg/dL (ref 6–20)
CO2: 23 mmol/L (ref 22–32)
Calcium: 8.7 mg/dL — ABNORMAL LOW (ref 8.9–10.3)
Chloride: 105 mmol/L (ref 98–111)
Creatinine, Ser: 0.63 mg/dL (ref 0.61–1.24)
GFR calc Af Amer: >60 mL/min (ref >60)
GFR calc non Af Amer: >60 mL/min (ref >60)
Glucose, Bld: 125 mg/dL — ABNORMAL HIGH (ref 70–99)
Potassium: 3.9 mmol/L (ref 3.5–5.1)
Sodium: 135 mmol/L (ref 135–145)

## 2018-12-28 MED ORDER — ENSURE ENLIVE PO LIQD
237.0000 mL | Freq: Two times a day (BID) | ORAL | Status: DC
  Administered 2018-12-28 – 2018-12-30 (×4): 237 mL via ORAL

## 2018-12-28 MED ORDER — ADULT MULTIVITAMIN W/MINERALS CH
1.0000 | ORAL_TABLET | Freq: Every day | ORAL | Status: DC
  Administered 2018-12-28 – 2018-12-30 (×3): 1 via ORAL
  Filled 2018-12-28 (×2): qty 1

## 2018-12-28 NOTE — Care Management Note (Signed)
Case Management Note  Patient Details  Name: Joshua Moyer MRN: [id number] Date of Birth: 01/22/1961  Subjective/Objective:   58 yr old male s/p IM Nailing of left hip fracture after a fall. Patient lives with his dad. He is developmentally delayed and his sister is his POA.                Action/Plan: Patient's sister requested [name]. CM called referral to [name], [name].    Expected Discharge Date:    pending              Expected Discharge Plan:  [place]  In-House Referral:  NA  Discharge planning Services  CM Consult  Post Acute Care Choice:  Home Health, Durable Medical Equipment Choice offered to:  [name] POA / Guardian  DME Arranged:  [place] DME Agency:  [name]:  PT [name] Agency:  [name] ([name])  Status of Service:  Completed, signed off  If discussed at [name], dates discussed:    Additional Comments:  [name], RN 12/28/2018, 5:27 PM

## 2018-12-28 NOTE — Progress Notes (Signed)
Initial Nutrition Assessment  DOCUMENTATION CODES:   Not applicable  INTERVENTION:    Ensure Enlive po BID, each supplement provides 350 kcal and 20 grams of protein  Multivitamin daily  NUTRITION DIAGNOSIS:   Increased nutrient needs related to wound healing, hip fracture as evidenced by estimated needs.  GOAL:   Patient will meet greater than or equal to 90% of their needs  MONITOR:   PO intake, Supplement acceptance, Labs, Skin  REASON FOR ASSESSMENT:   Consult Hip fracture protocol  ASSESSMENT:   58 yo male with PMH of broken neck, A fib & flutter, cognitive developmental delay, hypothyroidism, GERD who was admitted with L hip fracture s/p fall at home.   Patient reports good intake now and PTA. He is consuming 100% of meals. He drinks Ensure at home, sometimes. His sister bought him Premier protein shake, and he likes that also. He says she wanted him to have extra protein without the extra calories. Weight is usually 193-205 lbs. Currently 192.5 lbs. No weight loss reported.  Labs reviewed. Medications reviewed and include Flomax.   NUTRITION - FOCUSED PHYSICAL EXAM:    Most Recent Value  Orbital Region  No depletion  Upper Arm Region  No depletion  Thoracic and Lumbar Region  No depletion  Buccal Region  No depletion  Temple Region  Mild depletion  Clavicle Bone Region  Mild depletion  Clavicle and Acromion Bone Region  No depletion  Scapular Bone Region  No depletion  Dorsal Hand  No depletion  Patellar Region  Mild depletion  Anterior Thigh Region  Mild depletion  Posterior Calf Region  Mild depletion  Edema (RD Assessment)  None  Hair  Reviewed  Eyes  Reviewed  Mouth  Reviewed  Skin  Reviewed  Nails  Reviewed       Diet Order:   Diet Order            Diet regular Room service appropriate? Yes; Fluid consistency: Thin  Diet effective now              EDUCATION NEEDS:   No education needs have been identified at this time  Skin:   Skin Assessment: Reviewed RN Assessment  Last BM:  1/4  Height:   Ht Readings from Last 1 Encounters:  12/26/18 6\' 5"  (1.956 m)    Weight:   Wt Readings from Last 1 Encounters:  12/26/18 87.5 kg    Ideal Body Weight:  94.5 kg  BMI:  Body mass index is 22.89 kg/m.  Estimated Nutritional Needs:   Kcal:  2300-2500  Protein:  110-125 gm  Fluid:  2.5 L    Molli Barrows, RD, LDN, [place] Pager [phone] After Hours Pager [phone]

## 2018-12-28 NOTE — Progress Notes (Signed)
Physical Therapy Treatment Patient Details Name: Joshua Moyer MRN: [id number] DOB: 12-20-1961 Today's Date: 12/28/2018    History of Present Illness 58 y.o. male with medical history significant of A.Fib just on ASA 81, broken neck in '96 s/p ORIF, developmental delay.  Patient presents to the ED with severe L hip pain following a fall.  Going down a few wooden steps, slipped, fell, landed on L hip. Imaging revealed L hip fx, s/p L hip IM nail.     PT Comments    Pt seen for second session today due to sister's request.  She is worried about Joshua Moyer going home with his 27 y.o. father who cannot provide any physical care for the patient at discharge, yet, she is also not wanting to pursue rehab for Joshua Moyer either.  PT did see him for a second session and he remains min assist overall for short distance hallway gait and bed mobility.  PT will continue to follow acutely for safe mobility progression  Follow Up Recommendations  SNF;Other (comment)(sister refusing SNF, so please arrange HHPT)     Equipment Recommendations  Rolling walker with 5" wheels    Recommendations for Other Services   NA     Precautions / Restrictions Precautions Precautions: Fall Precaution Comments: h/o falls Restrictions LLE Weight Bearing: Weight bearing as tolerated    Mobility  Bed Mobility Overal bed mobility: Needs Assistance Bed Mobility: Supine to Sit     Supine to sit: HOB elevated;Min assist     General bed mobility comments: Pt with heavy use of bed rail, HOB only mildly elevated at ~30 degrees.  Assist needed to help progress his left leg over EOB (came out on the right side as this is the side he would typically go out of at home).  Min assist separately at trunk, hand held assist to pull and rail.   Transfers Overall transfer level: Needs assistance Equipment used: Rolling walker (2 wheeled) Transfers: Sit to/from Stand Sit to Stand: Min assist;From elevated surface         General  transfer comment: Min assist to support trunk and stabilize RW from elevated bed.  Verbal cues for safe hand placement and to wait on therapist to be ready to assist him before trying to get up.   Ambulation/Gait Ambulation/Gait assistance: Min assist Gait Distance (Feet): 65 Feet Assistive device: Rolling walker (2 wheeled) Gait Pattern/deviations: Step-through pattern;Antalgic Gait velocity: decreased   General Gait Details: Pt with moderately antalgic gait pattern and periodic spasms in his legs during exercise and gait.  Chair brought behind him to encourage increased safety and to encourage increased gait distance.          Balance Overall balance assessment: Needs assistance Sitting-balance support: Feet supported;Bilateral upper extremity supported Sitting balance-Leahy Scale: Fair Sitting balance - Comments: leans right to off weight his left leg   Standing balance support: Bilateral upper extremity supported Standing balance-Leahy Scale: Poor Standing balance comment: needs support from RW and therapist.                             Cognition Arousal/Alertness: Awake/alert Behavior During Therapy: WFL for tasks assessed/performed Overall Cognitive Status: History of cognitive impairments - at baseline                                 General Comments: h/o developmental delay  Exercises Total Joint Exercises Ankle Circles/Pumps: AROM;Both;20 reps Quad Sets: AROM;Left;5 reps Heel Slides: AAROM;Left;5 reps Hip ABduction/ADduction: AAROM;Left;10 reps        Pertinent Vitals/Pain Pain Assessment: 0-10 Pain Score: 7  Pain Location: L leg from hip down Pain Descriptors / Indicators: Cramping;Aching;Burning Pain Intervention(s): Limited activity within patient's tolerance;Monitored during session;Repositioned;Patient requesting pain meds-RN notified        PT Goals (current goals can now be found in the care plan section) Acute Rehab  PT Goals Patient Stated Goal: get back to [employer] practice Progress towards PT goals: Progressing toward goals    Frequency    Min 5X/week      PT Plan Current plan remains appropriate       AM-PAC PT "6 Clicks" Mobility   Outcome Measure  Help needed turning from your back to your side while in a flat bed without using bedrails?: A Little Help needed moving from lying on your back to sitting on the side of a flat bed without using bedrails?: A Little Help needed moving to and from a bed to a chair (including a wheelchair)?: A Little Help needed standing up from a chair using your arms (e.g., wheelchair or bedside chair)?: A Little Help needed to walk in hospital room?: A Little Help needed climbing 3-5 steps with a railing? : A Lot 6 Click Score: 17    End of Session Equipment Utilized During Treatment: Gait belt Activity Tolerance: Patient limited by pain Patient left: in chair;with call bell/phone within reach;with chair alarm set Nurse Communication: Patient requests pain meds(via call bell) PT Visit Diagnosis: Unsteadiness on feet (R26.81);Other abnormalities of gait and mobility (R26.89);History of falling (Z91.81);Muscle weakness (generalized) (M62.81);Difficulty in walking, not elsewhere classified (R26.2);Pain Pain - Right/Left: Left Pain - part of body: Hip     Time: 2119-4174 PT Time Calculation (min) (ACUTE ONLY): 39 min  Charges:  $Gait Training: 8-22 mins $Therapeutic Exercise: 8-22 mins $Therapeutic Activity: 8-22 mins             Trichelle Lehan B. Dennette Faulconer, PT, DPT  Acute Rehabilitation [phone] pager #[phone] office            12/28/2018, 5:58 PM

## 2018-12-28 NOTE — Progress Notes (Signed)
Patient Demographics:    Joshua Moyer, is a 58 y.o. male, DOB - 07-14-1961, VWU:[id number]  Admit date - 12/26/2018   Admitting Physician Etta Quill, DO  Outpatient Primary MD for the patient is Hulan Fess, MD  LOS - 2   Chief Complaint  Patient presents with  . Fall        Subjective:    Joshua Moyer today has no fevers, no emesis,  No chest pain, eating and drinking okay,  Assessment  & Plan :    Principal Problem:   Closed left hip fracture [facility]) Active Problems:   Cognitive developmental delay   Atrial fibrillation - chronic, rate-contreolled  Brief summary 58 y.o. male with medical history significant of A.Fib  on ASA 81,   developmental delay.    Admitted on 12/26/2018 with severe L hip pain following a fall.  Going down a few wooden steps, slipped, fell, landed on L hip, status post ORIF on 12/27/2018  Plan:-  1)Lt HIP Fx-- s/p ORIF on 12/27/18, WBAT, follow instructions by Ortho team  2) chronic A. Fib--- not on anticoagulation (CHADs score= 0)..., Continue aspirin  3) developmental delay with baseline cognitive deficits--- supportive care  4) urinary retention--- Flomax as prescribed, patient needed in and out Foley with 1600 mL of urine removed may need indwelling Foley  5) hypothyroidism--- continue levothyroxine 25 mcg daily  Code Status : Full  Family Communication:   None at bedside   Disposition Plan  :  SNF Vs HH PT ----possible discharge home in a.m. if she is refuses SNF otherwise discharge to SNF-----    therapist recommended SNF but patient sister is leaning towards home with home health therapy  Consults  :  ortho  DVT Prophylaxis  : Aspirin 81 mg twice daily as per orthopedic team SCDs   Lab Results  Component Value Date   PLT 201 12/28/2018    Inpatient Medications  Scheduled Meds: . aspirin  81 mg Oral BID WC  . diltiazem  120 mg Oral Daily    . feeding supplement (ENSURE ENLIVE)  237 mL Oral BID BM  . levothyroxine  25 mcg Oral QAC breakfast  . multivitamin with minerals  1 tablet Oral Daily  . senna-docusate  2 tablet Oral BID  . tamsulosin  0.4 mg Oral QPC supper   Continuous Infusions: . sodium chloride 75 mL/hr at 12/27/18 1458  . methocarbamol (ROBAXIN) IV    . methocarbamol (ROBAXIN) IV     PRN Meds:.acetaminophen, fentaNYL (SUBLIMAZE) injection, HYDROcodone-acetaminophen, HYDROcodone-acetaminophen, HYDROcodone-acetaminophen, methocarbamol **OR** methocarbamol (ROBAXIN) IV, methocarbamol **OR** methocarbamol (ROBAXIN) IV, metoCLOPramide **OR** metoCLOPramide (REGLAN) injection, morphine injection, ondansetron **OR** ondansetron (ZOFRAN) IV    Anti-infectives (From admission, onward)   Start     Dose/Rate Route Frequency Ordered Stop   12/27/18 0745  ceFAZolin (ANCEF) IVPB 2g/100 mL premix     2 g 200 mL/hr over 30 Minutes Intravenous On call to O.R. 12/27/18 0734 12/27/18 1050        Objective:   Vitals:   12/27/18 1952 12/28/18 0058 12/28/18 0358 12/28/18 1544  BP: 106/78 110/74 112/85 112/72  Pulse: 94 84 92 (!) 101  Resp:      Temp: 97.9 F (36.6 C) 97.8 F (36.6 C)  98.3 F (36.8 C) 98.2 F (36.8 C)  TempSrc: Oral Oral Oral Oral  SpO2: 98% 97% 97% 100%  Weight:      Height:        Wt Readings from Last 3 Encounters:  12/26/18 87.5 kg  11/04/16 89.6 kg  10/29/16 (P) 89.6 kg     Intake/Output Summary (Last 24 hours) at 12/28/2018 1800 Last data filed at 12/28/2018 0112 Gross per 24 hour  Intake -  Output 300 ml  Net -300 ml     Physical Exam Patient is examined daily including today on 12/28/18 , exams remain the same as of yesterday except that has changed   Gen:- Awake Alert, no acute distress HEENT:- [place].AT, No sclera icterus Neck-Supple Neck,No JVD,.  Lungs-  CTAB , fair symmetrical air movement CV- S1, S2 normal, regular  Abd-  +ve B.Sounds, Abd Soft, No tenderness,     Extremity/Skin:- No  edema, pedal pulses present  Psych-affect is appropriate, , patient with baseline cognitive deficits due to developmental delay Neuro-no new focal deficits, no tremors MSK--left hip post op site clean dressing  Data Review:   Micro Results Recent Results (from the past 240 hour(s))  MRSA PCR Screening     Status: None   Collection Time: 12/27/18  9:13 AM  Result Value Ref Range Status   MRSA by PCR NEGATIVE NEGATIVE Final    Comment:        The GeneXpert MRSA Assay (FDA approved for NASAL specimens only), is one component of a comprehensive MRSA colonization surveillance program. It is not intended to diagnose MRSA infection nor to guide or monitor treatment for MRSA infections. Performed at [facility], [place] [address] [place], [place] [address]     Radiology Reports Dg Chest 1 View  Result Date: 12/26/2018 CLINICAL DATA:  Fall on left side EXAM: CHEST  1 VIEW COMPARISON:  None. FINDINGS: Heart size within normal limits. The lungs appear clear. No obvious acute rib discontinuity or other appreciable fracture. No blunting of the costophrenic angles. IMPRESSION: 1.  No significant abnormality identified. Electronically Signed   By: Van Clines M.D.   On: 12/26/2018 20:07   Ct Head Wo Contrast  Result Date: 12/26/2018 CLINICAL DATA:  Tripped and fell today EXAM: CT HEAD WITHOUT CONTRAST CT CERVICAL SPINE WITHOUT CONTRAST TECHNIQUE: Multidetector CT imaging of the head and cervical spine was performed following the standard protocol without intravenous contrast. Multiplanar CT image reconstructions of the cervical spine were also generated. COMPARISON:  None FINDINGS: CT HEAD FINDINGS Brain: Scattered motion artifacts. Normal ventricular morphology. No midline shift or mass effect. Normal appearance of brain parenchyma. No intracranial hemorrhage, mass lesion or evidence of acute infarction. No extra-axial fluid collections. Vascular: No hyperdense  vessels. Skull: Skull appears intact. Sinuses/Orbits: Mild mucosal thickening in the maxillary sinuses. Remaining paranasal sinuses and mastoid air cells clear. Other: N/A CT CERVICAL SPINE FINDINGS Alignment: Exam significantly degraded by patient motion. Minimal retrolisthesis at C5-C6. No additional gross cervical mild alignment identified. Skull base and vertebrae: Disc space narrowing at C5-C6 and C3-C4. Endplate spur formation at C5-C6. Encroachment upon cervical neural foramina bilaterally at C5-C6 by uncovertebral and facet hypertrophy. Multilevel facet degenerative changes. Vertebral body heights appear grossly maintained. No gross fracture is identified. Prior posterior fusion of C1-C2. Uncovertebral spurs also encroach upon the LEFT C3-C4 and RIGHT C6-C7 foramina, to lesser degrees at additional levels. Soft tissues and spinal canal: Prevertebral soft tissues grossly normal thickness Disc levels:  No additional  gross abnormalities Upper chest: Tips of lung apices clear. Other: N/A IMPRESSION: No acute intracranial abnormalities. 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 Postoperative changes of posterior fusion at C1-C2. No gross acute cervical spine abnormalities are identified though the exam is severely limited secondary to patient motion, unable to remain still due to ongoing complaints of leg pain if patient has neck symptoms consider repeat imaging once the patient is able to fully cooperate with imaging. Electronically Signed   By: Lavonia Dana M.D.   On: 12/26/2018 20:57   Ct Cervical Spine Wo Contrast  Result Date: 12/26/2018 CLINICAL DATA:  Tripped and fell today EXAM: CT HEAD WITHOUT CONTRAST CT CERVICAL SPINE WITHOUT CONTRAST TECHNIQUE: Multidetector CT imaging of the head and cervical spine was performed following the standard protocol without intravenous contrast. Multiplanar CT image reconstructions of the cervical spine were also generated. COMPARISON:   None FINDINGS: CT HEAD FINDINGS Brain: Scattered motion artifacts. Normal ventricular morphology. No midline shift or mass effect. Normal appearance of brain parenchyma. No intracranial hemorrhage, mass lesion or evidence of acute infarction. No extra-axial fluid collections. Vascular: No hyperdense vessels. Skull: Skull appears intact. Sinuses/Orbits: Mild mucosal thickening in the maxillary sinuses. Remaining paranasal sinuses and mastoid air cells clear. Other: N/A CT CERVICAL SPINE FINDINGS Alignment: Exam significantly degraded by patient motion. Minimal retrolisthesis at C5-C6. No additional gross cervical mild alignment identified. Skull base and vertebrae: Disc space narrowing at C5-C6 and C3-C4. Endplate spur formation at C5-C6. Encroachment upon cervical neural foramina bilaterally at C5-C6 by uncovertebral and facet hypertrophy. Multilevel facet degenerative changes. Vertebral body heights appear grossly maintained. No gross fracture is identified. Prior posterior fusion of C1-C2. Uncovertebral spurs also encroach upon the LEFT C3-C4 and RIGHT C6-C7 foramina, to lesser degrees at additional levels. Soft tissues and spinal canal: Prevertebral soft tissues grossly normal thickness Disc levels:  No additional gross abnormalities Upper chest: Tips of lung apices clear. Other: N/A IMPRESSION: No acute intracranial abnormalities. 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 Postoperative changes of posterior fusion at C1-C2. No gross acute cervical spine abnormalities are identified though the exam is severely limited secondary to patient motion, unable to remain still due to ongoing complaints of leg pain if patient has neck symptoms consider repeat imaging once the patient is able to fully cooperate with imaging. Electronically Signed   By: Lavonia Dana M.D.   On: 12/26/2018 20:57   Dg C-arm 1-60 Min  Result Date: 12/27/2018 CLINICAL DATA:  The patient suffered a left  intertrochanteric fracture in a fall 12/26/2018. Intraoperative imaging for fracture fixation. Initial encounter. EXAM: DG HIP (WITH OR WITHOUT PELVIS) 2-3V LEFT; DG C-ARM 61-120 MIN COMPARISON:  Plain films left hip 12/26/2018. FINDINGS: Four fluoroscopic intraoperative spot views demonstrate placement of a compression hip screw and short intramedullary nail with a single screw for fixation of an intertrochanteric fracture. Hardware is intact. Position and alignment of the patient's fracture are markedly improved. No acute abnormality. IMPRESSION: Intraoperative imaging for fixation of a left intertrochanteric fracture. No acute abnormality. Electronically Signed   By: Inge Rise M.D.   On: 12/27/2018 11:55   Dg Hip Unilat With Pelvis 2-3 Views Left  Result Date: 12/27/2018 CLINICAL DATA:  The patient suffered a left intertrochanteric fracture in a fall 12/26/2018. Intraoperative imaging for fracture fixation. Initial encounter. EXAM: DG HIP (WITH OR WITHOUT PELVIS) 2-3V LEFT; DG C-ARM 61-120 MIN COMPARISON:  Plain films left hip 12/26/2018. FINDINGS: Four fluoroscopic intraoperative  spot views demonstrate placement of a compression hip screw and short intramedullary nail with a single screw for fixation of an intertrochanteric fracture. Hardware is intact. Position and alignment of the patient's fracture are markedly improved. No acute abnormality. IMPRESSION: Intraoperative imaging for fixation of a left intertrochanteric fracture. No acute abnormality. Electronically Signed   By: Inge Rise M.D.   On: 12/27/2018 11:55   Dg Hip Unilat With Pelvis 2-3 Views Left  Result Date: 12/26/2018 CLINICAL DATA:  Fall on the left side EXAM: DG HIP (WITH OR WITHOUT PELVIS) 2-3V LEFT COMPARISON:  None. FINDINGS: Left hip reverse oblique fracture with 3.0 cm medial displacement of the distal fracture fragment, 2.4 cm of overlap, and extensive varus angulation. Severe degenerative arthropathy of the left hip.  Moderate degenerative arthropathy of the right hip. IMPRESSION: 1. Considerably displaced, overlapped, and angulated reverse oblique fracture of the left hip. 2. Severe degenerative arthropathy of the left hip. Moderate degenerative arthropathy of the right hip. Electronically Signed   By: Van Clines M.D.   On: 12/26/2018 20:06     CBC Recent Labs  Lab 12/26/18 2133 12/28/18 0459  WBC 10.4 11.3*  HGB 14.7 13.0  HCT 41.8 37.8*  PLT 220 201  MCV 97.4 98.2  MCH 34.3* 33.8  MCHC 35.2 34.4  RDW 12.0 12.0  LYMPHSABS 0.8  --   MONOABS 0.8  --   EOSABS 0.0  --   BASOSABS 0.0  --     Chemistries  Recent Labs  Lab 12/26/18 2133 12/28/18 0459  NA 133* 135  K 3.5 3.9  CL 102 105  CO2 23 23  GLUCOSE 106* 125*  BUN 18 10  CREATININE 0.83 0.63  CALCIUM 8.8* 8.7*    Joshua Moyer M.D on 12/28/2018 at 6:00 PM  Pager---[phone] Go to [email or web address] - [email or web address] for contact info  [facility] - Office  [phone]

## 2018-12-28 NOTE — Evaluation (Signed)
Physical Therapy Evaluation Patient Details Name: Joshua Moyer MRN: [id number] DOB: [birth year] Today's Date: 12/28/2018   History of Present Illness  58 y.o. male with medical history significant of A.Fib just on ASA 81, broken neck in 96 s/p ORIF, developmental delay.  Patient presents to the ED with severe L hip pain following a fall.  Going down a few wooden steps, slipped, fell, landed on L hip. Imaging revealed L hip fx, s/p L hip IM nail   Clinical Impression  PTA pt independent in ADLs and mobility, living with father in 2 story home with stair lift and ramped entrance. Pt currently limited in safe mobility by decreased safety/deficit awareness. Pt requires MinA for bed mobility, transfers and ambulation of 60 feet with RW. PT recommends HHPT level rehab at discharge to work on strength and endurance as well as safe navigation of his home environment. PT will follow acutely.     Follow Up Recommendations Home health PT;Supervision/Assistance - 24 hour    Equipment Recommendations  Rolling walker with 5" wheels       Precautions / Restrictions Precautions Precautions: Fall Restrictions Weight Bearing Restrictions: Yes LLE Weight Bearing: Weight bearing as tolerated      Mobility  Bed Mobility Overal bed mobility: Needs Assistance Bed Mobility: Supine to Sit     Supine to sit: HOB elevated;Min assist     General bed mobility comments: min A for management of LE off of bed and pad scoot of hips to EoB, vc for use of UE on bedrail to pull hips towards EoB    Transfers Overall transfer level: Needs assistance Equipment used: Rolling walker (2 wheeled) Transfers: Sit to/from Stand Sit to Stand: Min assist;From elevated surface         General transfer comment: MinA for power up and steadying in standing with RW   Ambulation/Gait Ambulation/Gait assistance: Min assist Gait Distance (Feet): 60 Feet Assistive device: Rolling walker (2 wheeled) Gait  Pattern/deviations: Step-through pattern;Decreased stance time - right;Decreased stance time - left;Antalgic Gait velocity: slowed Gait velocity interpretation: <1.31 ft/sec, indicative of household ambulator General Gait Details: minA for steadying with RW, slow, antalgic gait, mild instability, vc for upright posture, pt with decreased awareness to increased fatigue requiring chair follow         Balance Overall balance assessment: Modified Independent Sitting-balance support: Feet supported;No upper extremity supported Sitting balance-Leahy Scale: Fair     Standing balance support: Bilateral upper extremity supported Standing balance-Leahy Scale: Poor                               Pertinent Vitals/Pain Pain Assessment: 0-10 Pain Score: 4  Pain Location: L leg from hip down Pain Descriptors / Indicators: Aching;Sore Pain Intervention(s): Premedicated before session;Monitored during session;Limited activity within patient's tolerance;Repositioned    Home Living Family/patient expects to be discharged to:: Private residence Living Arrangements: Parent Available Help at Discharge: Available 24 hours/day Type of Home: House Home Access: Stairs to enter;Ramped entrance Entrance Stairs-Rails: Right Entrance Stairs-Number of Steps: 2 Home Layout: Two level;1/2 bath on main level;Able to live on main level with bedroom/bathroom(Lift to second floor) Home Equipment: Shower seat - built in Additional Comments: Pt reports receiving some help from family but only for basic ADLs. Pt will requires assist with mobility for safety.    Prior Function Level of Independence: Independent  Extremity/Trunk Assessment   Upper Extremity Assessment Upper Extremity Assessment: Overall WFL for tasks assessed    Lower Extremity Assessment Lower Extremity Assessment: LLE deficits/detail LLE Deficits / Details: Decreased ROM and strength due to operative pain   LLE: Unable to fully assess due to pain       Communication   Communication: Expressive difficulties;Receptive difficulties  Cognition Arousal/Alertness: Awake/alert Behavior During Therapy: WFL for tasks assessed/performed Overall Cognitive Status: History of cognitive impairments - at baseline                                 General Comments: developmental delays, however able to supply info on prior function and Home living without difficulty       General Comments General comments (skin integrity, edema, etc.): Dressings in tact and dry with minor drainage, VSS        Assessment/Plan    PT Assessment Patient needs continued PT services  PT Problem List Decreased strength;Decreased activity tolerance;Decreased balance;Decreased mobility;Decreased cognition;Decreased safety awareness;Pain       PT Treatment Interventions DME instruction;Gait training;Functional mobility training;Therapeutic activities;Therapeutic exercise;Balance training;Cognitive remediation;Patient/family education    PT Goals (Current goals can be found in the Care Plan section)  Acute Rehab PT Goals Patient Stated Goal: get back to [employer] practice PT Goal Formulation: With patient Time For Goal Achievement: 01/11/19 Potential to Achieve Goals: Fair    Frequency Min 5X/week    AM-PAC PT "6 Clicks" Mobility  Outcome Measure Help needed turning from your back to your side while in a flat bed without using bedrails?: A Little Help needed moving from lying on your back to sitting on the side of a flat bed without using bedrails?: A Lot Help needed moving to and from a bed to a chair (including a wheelchair)?: A Little Help needed standing up from a chair using your arms (e.g., wheelchair or bedside chair)?: A Little Help needed to walk in hospital room?: A Little Help needed climbing 3-5 steps with a railing? : A Lot 6 Click Score: 16    End of Session Equipment Utilized  During Treatment: Gait belt Activity Tolerance: Patient tolerated treatment well Patient left: in chair;with call bell/phone within reach;with chair alarm set Nurse Communication: Mobility status PT Visit Diagnosis: Unsteadiness on feet (R26.81);Other abnormalities of gait and mobility (R26.89);History of falling (Z91.81);Muscle weakness (generalized) (M62.81);Difficulty in walking, not elsewhere classified (R26.2);Pain Pain - Right/Left: Left Pain - part of body: Hip    Time: 8295-6213 PT Time Calculation (min) (ACUTE ONLY): 32 min   Charges:   PT Evaluation $PT Eval Low Complexity: 1 Low PT Treatments $Gait Training: 8-22 mins        Inika Bellanger B. [name] PT, DPT Acute Rehabilitation Services Pager [phone] Office [phone]   [place] 12/28/2018, 2:09 PM

## 2018-12-28 NOTE — Progress Notes (Signed)
   Subjective:  Patient reports pain as mild.  No complaints this am. Some hip and thigh pain, but improved vs. Before surgery.  Denies any CP/SOB/N/V.  Objective:   VITALS:   Vitals:   12/27/18 1241 12/27/18 1952 12/28/18 0058 12/28/18 0358  BP: 104/81 106/78 110/74 112/85  Pulse: 98 94 84 92  Resp: 16     Temp: (!) 97.5 F (36.4 C) 97.9 F (36.6 C) 97.8 F (36.6 C) 98.3 F (36.8 C)  TempSrc: Oral Oral Oral Oral  SpO2: 96% 98% 97% 97%  Weight:      Height:        Neurologically intact Neurovascular intact Sensation intact distally Intact pulses distally Dorsiflexion/Plantar flexion intact Incision: dressing C/D/I and no drainage Compartment soft   Lab Results  Component Value Date   WBC 11.3 (H) 12/28/2018   HGB 13.0 12/28/2018   HCT 37.8 (L) 12/28/2018   MCV 98.2 12/28/2018   PLT 201 12/28/2018   BMET    Component Value Date/Time   NA 135 12/28/2018 0459   K 3.9 12/28/2018 0459   CL 105 12/28/2018 0459   CO2 23 12/28/2018 0459   GLUCOSE 125 (H) 12/28/2018 0459   BUN 10 12/28/2018 0459   CREATININE 0.63 12/28/2018 0459   CALCIUM 8.7 (L) 12/28/2018 0459   GFRNONAA >60 12/28/2018 0459   GFRAA >60 12/28/2018 0459     Assessment/Plan: 1 Day Post-Op   Principal Problem:   Closed left hip fracture (HCC) Active Problems:   Cognitive developmental delay   Atrial fibrillation - chronic, rate-contreolled   Up with therapy - WBAT To LLE - maintain dressings until follow up, ok to get wet but do not submerge  - bid asa 81 mg for DVT oppx x 6 weeks - SCDS - PT/OT   Nicholes Stairs 12/28/2018, 12:01 PM   Geralynn Rile, MD [phone]

## 2018-12-28 NOTE — Evaluation (Signed)
Occupational Therapy Evaluation Patient Details Name: Joshua Moyer MRN: [id number] DOB: [birth year] Today's Date: 12/28/2018    History of Present Illness 58 y.o. male with medical history significant of A.Fib just on ASA 81, broken neck in 96 s/p ORIF, developmental delay.  Patient presents to the ED with severe L hip pain following a fall.  Going down a few wooden steps, slipped, fell, landed on L hip. Imaging revealed L hip fx, s/p L hip IM nail    Clinical Impression   PTA. Pt was independent in all ADLs and IADLs and enjoyed swimming and bowling with the [employer]. Currently, he requires Min Guard to [place] with transfers and ambulation with use RW for safety and balance. Also requires VCs for sequencing and hand placement during sit to stand transitions.Pt educated on LB dressing with use of AE, pt demonstrated learned strategies for understanding. Pt will benefit from continued OT to address safety in transfers, toileting, grooming, and LB dressing with use of AE to transition to next setting. Next session to address tub transfers.    Follow Up Recommendations  Home health OT    Equipment Recommendations  3 in 1 bedside commode    Recommendations for Other Services       Precautions / Restrictions Precautions Precautions: Fall Restrictions Weight Bearing Restrictions: Yes LLE Weight Bearing: Weight bearing as tolerated      Mobility Bed Mobility Overal bed mobility: Needs Assistance Bed Mobility: Supine to Sit     Supine to sit: HOB elevated;Min assist     General bed mobility comments: min A for management of LE off of bed and pad scoot of hips to EoB, vc for use of UE on bedrail to pull hips towards EoB    Transfers Overall transfer level: Needs assistance Equipment used: Rolling walker (2 wheeled) Transfers: Sit to/from Stand Sit to Stand: Min assist;From elevated surface         General transfer comment: Pt received Min guard to min A with transition  from sit to stand for safety with VC's to scoot to EOS to initiate power up with walker and hand placement for sequencing.    Balance Overall balance assessment: Modified Independent;Needs assistance Sitting-balance support: Feet supported;No upper extremity supported Sitting balance-Leahy Scale: Fair     Standing balance support: Bilateral upper extremity supported Standing balance-Leahy Scale: Poor                             ADL either performed or assessed with clinical judgement   ADL Overall ADL's : Needs assistance/impaired Eating/Feeding: Independent   Grooming: Set up   Upper Body Bathing: Set up   Lower Body Bathing: Set up;Minimal assistance   Upper Body Dressing : Set up   Lower Body Dressing: Set up;Minimal assistance   Toilet Transfer: Min guard;Minimal assistance   Toileting- Clothing Manipulation and Hygiene: Min guard       Functional mobility during ADLs: Supervision/safety;Min guard;Rolling walker;Cueing for safety;Cueing for sequencing General ADL Comments: Pt received Min guard to min A with transition from sit to stand for safety with VC's to scoot to EOS to initiate power up with walker and hand placement for sequencing. Pt transfered to toilet with min guard to min A stand to sit at 3 n 1 for safety and sequencing. Pt educated on LB dressing with use of AE, pt demonstrated learned strategies for understanding.      Vision  Perception     Praxis      Pertinent Vitals/Pain Pain Assessment: 0-10 Pain Score: 5  Pain Location: L leg from hip down Pain Descriptors / Indicators: Aching;Sore Pain Intervention(s): Monitored during session     Hand Dominance     Extremity/Trunk Assessment Upper Extremity Assessment Upper Extremity Assessment: Overall WFL for tasks assessed   Lower Extremity Assessment Lower Extremity Assessment: LLE deficits/detail;Defer to PT evaluation LLE Deficits / Details: Decreased ROM and strength  due to operative pain  LLE: Unable to fully assess due to pain       Communication Communication Communication: Expressive difficulties;Receptive difficulties   Cognition Arousal/Alertness: Awake/alert Behavior During Therapy: WFL for tasks assessed/performed Overall Cognitive Status: History of cognitive impairments - at baseline                                 General Comments: developmental delays, however able to supply info on prior function and Home living without difficulty    General Comments       Exercises     Shoulder Instructions      Home Living Family/patient expects to be discharged to:: Private residence Living Arrangements: Parent Available Help at Discharge: Available 24 hours/day Type of Home: House Home Access: Stairs to enter;Ramped entrance Entrance Stairs-Number of Steps: 2 Entrance Stairs-Rails: Right Home Layout: Two level;1/2 bath on main level;Able to live on main level with bedroom/bathroom(Lift to second floor)     Bathroom Shower/Tub: Tub/shower unit     Bathroom Accessibility: No   Home Equipment: Shower seat - built in   Additional Comments: Pt reports receiving some help from family but only for basic ADLs. Pt will requires assist with mobility for safety.      Prior Functioning/Environment Level of Independence: Independent                 OT Problem List: Decreased range of motion;Decreased safety awareness;Impaired balance (sitting and/or standing);Pain      OT Treatment/Interventions: Self-care/ADL training;DME and/or AE instruction;Patient/family education    OT Goals(Current goals can be found in the care plan section) Acute Rehab OT Goals Patient Stated Goal: Pt reports wanting to get back to being active in his interest of sports. OT Goal Formulation: With patient Time For Goal Achievement: 01/11/19 Potential to Achieve Goals: Good ADL Goals Pt Will Perform Grooming: with supervision;standing Pt  Will Perform Lower Body Bathing: with supervision;sit to/from stand Pt Will Perform Lower Body Dressing: with supervision;with adaptive equipment;sit to/from stand Pt Will Transfer to Toilet: with modified independence;ambulating Pt Will Perform Toileting - Clothing Manipulation and hygiene: with modified independence;sit to/from stand  OT Frequency: Min 2X/week   Barriers to D/C: Decreased caregiver support  Pt reports helping parents mostly and due to size and height he feels his parents are less likely able to assist and help due to limited strength and safety concerns.       Co-evaluation              AM-PAC OT "6 Clicks" Daily Activity     Outcome Measure Help from another person eating meals?: None Help from another person taking care of personal grooming?: A Little Help from another person toileting, which includes using toliet, bedpan, or urinal?: A Lot Help from another person bathing (including washing, rinsing, drying)?: A Lot Help from another person to put on and taking off regular upper body clothing?: None Help from another person to  put on and taking off regular lower body clothing?: A Lot 6 Click Score: 17   End of Session Equipment Utilized During Treatment: Gait belt;Rolling walker Nurse Communication: Mobility status;Weight bearing status  Activity Tolerance: Patient tolerated treatment well Patient left: in chair;with call bell/phone within reach;with chair alarm set  OT Visit Diagnosis: Unsteadiness on feet (R26.81);History of falling (Z91.81)                Time: 9937-1696 OT Time Calculation (min): 29 min Charges:  OT General Charges $OT Visit: 1 Visit OT Evaluation $OT Eval Moderate Complexity: 1 Mod OT Treatments $Self Care/Home Management : 8-22 mins  Hulda Humphrey OTR/L Acute Rehabilitation Services Pager: [phone] Office: [place] 12/28/2018, 12:02 PM

## 2018-12-29 ENCOUNTER — Encounter (HOSPITAL_COMMUNITY): Payer: Self-pay | Admitting: Orthopedic Surgery

## 2018-12-29 MED ORDER — HYDROCODONE-ACETAMINOPHEN 5-325 MG PO TABS: 1.0000 | ORAL_TABLET | Freq: Four times a day (QID) | ORAL | 0 refills | Status: DC | PRN

## 2018-12-29 MED ORDER — SENNOSIDES-DOCUSATE SODIUM 8.6-50 MG PO TABS: 2.0000 | ORAL_TABLET | Freq: Every day | ORAL | 1 refills | Status: DC

## 2018-12-29 MED ORDER — ASPIRIN 81 MG PO CHEW: 81.0000 mg | CHEWABLE_TABLET | Freq: Two times a day (BID) | ORAL | 1 refills | Status: DC

## 2018-12-29 MED ORDER — ADULT MULTIVITAMIN W/MINERALS CH: 1.0000 | ORAL_TABLET | Freq: Every day | ORAL | Status: AC

## 2018-12-29 MED ORDER — ONDANSETRON HCL 4 MG PO TABS: 4.0000 mg | ORAL_TABLET | Freq: Four times a day (QID) | ORAL | 0 refills | Status: DC | PRN

## 2018-12-29 MED ORDER — BISACODYL 10 MG RE SUPP
10.0000 mg | Freq: Once | RECTAL | Status: AC
  Administered 2018-12-29: 10 mg via RECTAL
  Filled 2018-12-29: qty 1

## 2018-12-29 MED ORDER — TAMSULOSIN HCL 0.4 MG PO CAPS: 0.4000 mg | ORAL_CAPSULE | Freq: Every day | ORAL | 0 refills | Status: DC

## 2018-12-29 MED ORDER — METHOCARBAMOL 500 MG PO TABS: 500.0000 mg | ORAL_TABLET | Freq: Three times a day (TID) | ORAL | 1 refills | Status: DC

## 2018-12-29 NOTE — Discharge Instructions (Signed)
°  1)Status- post left hip surgery--- weightbearing as tolerated to left lower extremity 2) use a walker, avoid falls/fall precautions 3)- maintain dressings until follow up, ok to get wet but do not submerge 4) aspirin 81 mg twice a day with food for deep vein thrombosis prophylaxis for 6 weeks post operatively and then back down to aspirin 81 mg daily after 6 weeks as previous for atrial fibrillation stroke prophylaxis  5)You are taking aspirin so Avoid ibuprofen/Advil/Aleve/Motrin/Goody Powders/Naproxen/BC powders/Meloxicam/Diclofenac/Indomethacin and other Nonsteroidal anti-inflammatory medications as these will make you more likely to bleed and can cause stomach ulcers, can also cause Kidney problems.  6) follow-up with orthopedic surgeon as advised in 2  weeks for recheck 7)Home health physical therapy and Occupational Therapy as ordered   - Ortho instructions: - ok for full weight bearing as tolerated tot he left leg - maintain post op bandage until follow up appointment.  You may shower in the bandage but do not submerge under water - return to see Dr. Stann Mainland in 2 weeks. - for the prevention of blood clots take an 81 mg twice daily for 6 weeks.

## 2018-12-29 NOTE — Discharge Summary (Signed)
Joshua Moyer, is a 58 y.o. male  DOB 02/16/1961  MRN [id number].  Admission date:  12/26/2018  Admitting Physician  Joshua Quill, DO  Discharge Date:  12/29/2018   Primary MD  Joshua Fess, MD  Recommendations for primary care physician for things to follow:   1)Status- post left hip surgery--- weightbearing as tolerated to left lower extremity 2) use a walker, avoid falls/fall precautions 3)- maintain dressings until follow up, ok to get wet but do not submerge 4) aspirin 81 mg twice a day with food for deep vein thrombosis prophylaxis for 6 weeks post operatively and then back down to aspirin 81 mg daily after 6 weeks as previous for atrial fibrillation stroke prophylaxis  5)You are taking aspirin so Avoid ibuprofen/Advil/Aleve/Motrin/Goody Powders/Naproxen/BC powders/Meloxicam/Diclofenac/Indomethacin and other Nonsteroidal anti-inflammatory medications as these will make you more likely to bleed and can cause stomach ulcers, can also cause Kidney problems.  6) follow-up with orthopedic surgeon as advised in 2  weeks for recheck 7)Home health physical therapy and Occupational Therapy as ordered   - Ortho instructions: - ok for full weight bearing as tolerated tot he left leg - maintain post op bandage until follow up appointment.  You may shower in the bandage but do not submerge under water - return to see Joshua Moyer in 2 weeks. - for the prevention of blood clots take an 81 mg twice daily for 6 weeks.   Admission Diagnosis  Pre-op chest exam [Z01.811] Closed fracture of left hip, initial encounter (Joshua Moyer) [S72.002A] Fall in home, initial encounter [W19.Merril Abbe, O13.086]   Discharge Diagnosis  Pre-op chest exam [V78.469] Closed fracture of left hip, initial encounter (Joshua Moyer) [S72.002A] Fall in home, initial encounter [W19.XXXA, G29.528]    Principal Problem:   Closed left hip fracture ([place]) Active  Problems:   Cognitive developmental delay   Atrial fibrillation/Flutter - chronic, rate-controlled      Past Medical History:  Diagnosis Date  . Atrial fibrillation ([place])   . Atrial flutter ([place])   . Broken neck ([place])    In 1996  . Cognitive developmental delay   . Dysrhythmia   . GERD (gastroesophageal reflux disease)   . Hypothyroidism     Past Surgical History:  Procedure Laterality Date  . CARDIAC ELECTROPHYSIOLOGY STUDY AND ABLATION    . FEMUR IM NAIL Left 12/27/2018  . INGUINAL HERNIA REPAIR     right  . IR GENERIC HISTORICAL  09/10/2016   IR RADIOLOGIST EVAL & MGMT 09/10/2016 Joshua Mckusick, DO [facility] INTERV RAD  . LAPAROSCOPIC APPENDECTOMY N/A 11/04/2016   Procedure: APPENDECTOMY LAPAROSCOPIC;  Surgeon: Joshua Messing III, MD;  Location: [facility];  Service: General;  Laterality: N/A;  . NECK SURGERY     broke neck in 1996       HPI  from the history and physical done on the day of admission:    Chief Complaint: Fall  HPI: Joshua Moyer is a 58 y.o. male with medical history significant of A.Fib just on ASA 81, broken neck in 96  s/p ORIF, developmental delay.  Patient presents to the ED with severe L hip pain following a fall.  Going down a few wooden steps, slipped, fell, landed on L hip.  Didn't hit head or neck.  Crawled back to steps and was eventually able to call for help from a passer by.   ED Course: L hip fx.  No neck pain and denies hitting head or nec, but got CT head and neck because EMS put him in a C.Collar.  Of note patient was having chronic pain with L hip and limp on L side for which he was seeing PT recently.  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   Hospital Course:     Brief summary 58 y.o.malewith medical history significant ofA.Fib  on ASA 81,   developmental delay.   Admitted on 12/26/2018 with severe L hip pain following a fall. Going down a few wooden steps, slipped, fell, landed on L hip, status post ORIF  on 12/27/2018  Plan:-  1)Lt HIP Fx-- s/p ORIF on 12/27/18, WBAT, follow instructions by Ortho team  2) chronic A. Fib--- not on anticoagulation (CHADs score= 0)..., Continue aspirin as outlined in the discharge instructions  3) developmental delay with baseline cognitive deficits--- supportive care  4) urinary retention--- Flomax as prescribed, patient needed in and out Foley with 1600 mL of urine removed, patient is voiding okay now, suspect he had postanesthesia/postop urinary retention which is now resolved, may continue Flomax for 1 month and then discontinue  5) hypothyroidism--- continue levothyroxine 25 mcg daily  Code Status : Full  Family Communication:   None at bedside   Disposition Plan  :  SNF Vs HH PT ---- discharge home pt and sister refuses SNF  ----    therapist recommended SNF but patient sister is leaning towards home with home health therapy  Consults  :  ortho  DVT Prophylaxis  : Aspirin 81 mg twice daily as per orthopedic team SCDs    Discharge Condition: stable  Follow UP  Follow-up Information    Joshua Stairs, MD In 2 weeks.   Specialty:  Orthopedic Surgery Why:  For suture removal, For wound re-check Contact information: [address] [place] [address] [phone]        Home[facility].   Specialty:  [place] Why:  A representative from [name] will contact you to arrange start date and time for your therapy. Contact information: [address] [place] [address] [phone]          Diet and Activity recommendation:  As advised  Discharge Instructions    Discharge Instructions    Call MD for:  difficulty breathing, headache or visual disturbances   Complete by:  As directed    Call MD for:  persistant dizziness or light-headedness   Complete by:  As directed    Call MD for:  persistant nausea and vomiting   Complete by:  As directed    Call MD  for:  severe uncontrolled pain   Complete by:  As directed    Call MD for:  temperature >100.4   Complete by:  As directed    Diet - low sodium heart healthy   Complete by:  As directed    Discharge instructions   Complete by:  As directed    1)Status- post left hip surgery--- weightbearing as tolerated to left lower extremity  2) use a walker, avoid falls/fall precautions 3)- maintain dressings until follow up, ok to get wet but do not submerge 4) aspirin 81 mg twice a day with food for deep vein thrombosis prophylaxis for 6 weeks post operatively and then back down to aspirin 81 mg daily after 6 weeks as previous for atrial fibrillation stroke prophylaxis  5)You are taking aspirin so Avoid ibuprofen/Advil/Aleve/Motrin/Goody Powders/Naproxen/BC powders/Meloxicam/Diclofenac/Indomethacin and other Nonsteroidal anti-inflammatory medications as these will make you more likely to bleed and can cause stomach ulcers, can also cause Kidney problems.  6) follow-up with orthopedic surgeon as advised in 2  weeks for recheck 7)Home health physical therapy and Occupational Therapy as ordered   - Ortho instructions: - ok for full weight bearing as tolerated tot he left leg - maintain post op bandage until follow up appointment.  You may shower in the bandage but do not submerge under water - return to see Joshua Moyer in 2 weeks. - for the prevention of blood clots take an 81 mg twice daily for 6 weeks.   Increase activity slowly   Complete by:  As directed    Fall precautions        Discharge Medications     Allergies as of 12/29/2018      Reactions   Chlorhexidine Gluconate [chlorhexidine] Itching   Statins Other (See Comments)   Leg Pain       Medication List    STOP taking these medications   aspirin EC 81 MG tablet Replaced by:  aspirin 81 MG chewable tablet   naproxen sodium 220 MG tablet Commonly known as:  ALEVE     TAKE these medications   aspirin 81 MG chewable tablet Chew  1 tablet (81 mg total) by mouth 2 (two) times daily with a meal. For 6 weeks and then after that only once daily with breakfast Replaces:  aspirin EC 81 MG tablet   b complex vitamins tablet Take 1 tablet by mouth daily.   cholecalciferol 1000 units tablet Commonly known as:  VITAMIN D Take 1,000 Units by mouth daily.   diltiazem 120 MG 24 hr capsule Commonly known as:  TIAZAC Take 120 mg by mouth daily.   HYDROcodone-acetaminophen 5-325 MG tablet Commonly known as:  NORCO/VICODIN Take 1-2 tablets by mouth every 6 (six) hours as needed for moderate pain.   levothyroxine 25 MCG tablet Commonly known as:  SYNTHROID, LEVOTHROID Take 25 mcg by mouth daily before breakfast.   methocarbamol 500 MG tablet Commonly known as:  ROBAXIN Take 1 tablet (500 mg total) by mouth 3 (three) times daily.   multivitamin with minerals Tabs tablet Take 1 tablet by mouth daily. Start taking on:  December 30, 2018   NON FORMULARY Gaba 500 mg-Take one po daily   ondansetron 4 MG tablet Commonly known as:  ZOFRAN Take 1 tablet (4 mg total) by mouth every 6 (six) hours as needed for nausea.   OVER THE COUNTER MEDICATION Take 1 tablet by mouth daily. "Gaba" supplement   senna-docusate 8.6-50 MG tablet Commonly known as:  Senokot-S Take 2 tablets by mouth at bedtime.   tamsulosin 0.4 MG Caps capsule Commonly known as:  FLOMAX Take 1 capsule (0.4 mg total) by mouth daily after supper.   vitamin C 1000 MG tablet Take 1,000 mg by mouth daily.   vitamin E 400 UNIT capsule Take 400 Units by mouth daily.            Durable Medical Equipment  (From admission, onward)  Start     Ordered   12/28/18 1059  For home use only DME Walker rolling  Once    Question:  Patient needs a walker to treat with the following condition  Answer:  S/p left hip fracture   12/28/18 1059          Major procedures and Radiology Reports - PLEASE review detailed and final reports for all details, in  brief -   Dg Chest 1 View  Result Date: 12/26/2018 CLINICAL DATA:  Fall on left side EXAM: CHEST  1 VIEW COMPARISON:  None. FINDINGS: Heart size within normal limits. The lungs appear clear. No obvious acute rib discontinuity or other appreciable fracture. No blunting of the costophrenic angles. IMPRESSION: 1.  No significant abnormality identified. Electronically Signed   By: Van Clines M.D.   On: 12/26/2018 20:07   Ct Head Wo Contrast  Result Date: 12/26/2018 CLINICAL DATA:  Tripped and fell today EXAM: CT HEAD WITHOUT CONTRAST CT CERVICAL SPINE WITHOUT CONTRAST TECHNIQUE: Multidetector CT imaging of the head and cervical spine was performed following the standard protocol without intravenous contrast. Multiplanar CT image reconstructions of the cervical spine were also generated. COMPARISON:  None FINDINGS: CT HEAD FINDINGS Brain: Scattered motion artifacts. Normal ventricular morphology. No midline shift or mass effect. Normal appearance of brain parenchyma. No intracranial hemorrhage, mass lesion or evidence of acute infarction. No extra-axial fluid collections. Vascular: No hyperdense vessels. Skull: Skull appears intact. Sinuses/Orbits: Mild mucosal thickening in the maxillary sinuses. Remaining paranasal sinuses and mastoid air cells clear. Other: N/A CT CERVICAL SPINE FINDINGS Alignment: Exam significantly degraded by patient motion. Minimal retrolisthesis at C5-C6. No additional gross cervical mild alignment identified. Skull base and vertebrae: Disc space narrowing at C5-C6 and C3-C4. Endplate spur formation at C5-C6. Encroachment upon cervical neural foramina bilaterally at C5-C6 by uncovertebral and facet hypertrophy. Multilevel facet degenerative changes. Vertebral body heights appear grossly maintained. No gross fracture is identified. Prior posterior fusion of C1-C2. Uncovertebral spurs also encroach upon the LEFT C3-C4 and RIGHT C6-C7 foramina, to lesser degrees at additional levels.  Soft tissues and spinal canal: Prevertebral soft tissues grossly normal thickness Disc levels:  No additional gross abnormalities Upper chest: Tips of lung apices clear. Other: N/A IMPRESSION: No acute intracranial abnormalities. 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 Postoperative changes of posterior fusion at C1-C2. No gross acute cervical spine abnormalities are identified though the exam is severely limited secondary to patient motion, unable to remain still due to ongoing complaints of leg pain if patient has neck symptoms consider repeat imaging once the patient is able to fully cooperate with imaging. Electronically Signed   By: Lavonia Dana M.D.   On: 12/26/2018 20:57   Ct Cervical Spine Wo Contrast  Result Date: 12/26/2018 CLINICAL DATA:  Tripped and fell today EXAM: CT HEAD WITHOUT CONTRAST CT CERVICAL SPINE WITHOUT CONTRAST TECHNIQUE: Multidetector CT imaging of the head and cervical spine was performed following the standard protocol without intravenous contrast. Multiplanar CT image reconstructions of the cervical spine were also generated. COMPARISON:  None FINDINGS: CT HEAD FINDINGS Brain: Scattered motion artifacts. Normal ventricular morphology. No midline shift or mass effect. Normal appearance of brain parenchyma. No intracranial hemorrhage, mass lesion or evidence of acute infarction. No extra-axial fluid collections. Vascular: No hyperdense vessels. Skull: Skull appears intact. Sinuses/Orbits: Mild mucosal thickening in the maxillary sinuses. Remaining paranasal sinuses and mastoid air cells clear. Other: N/A CT CERVICAL SPINE FINDINGS Alignment: Exam significantly degraded by  patient motion. Minimal retrolisthesis at C5-C6. No additional gross cervical mild alignment identified. Skull base and vertebrae: Disc space narrowing at C5-C6 and C3-C4. Endplate spur formation at C5-C6. Encroachment upon cervical neural foramina bilaterally at C5-C6 by  uncovertebral and facet hypertrophy. Multilevel facet degenerative changes. Vertebral body heights appear grossly maintained. No gross fracture is identified. Prior posterior fusion of C1-C2. Uncovertebral spurs also encroach upon the LEFT C3-C4 and RIGHT C6-C7 foramina, to lesser degrees at additional levels. Soft tissues and spinal canal: Prevertebral soft tissues grossly normal thickness Disc levels:  No additional gross abnormalities Upper chest: Tips of lung apices clear. Other: N/A IMPRESSION: No acute intracranial abnormalities. 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 Postoperative changes of posterior fusion at C1-C2. No gross acute cervical spine abnormalities are identified though the exam is severely limited secondary to patient motion, unable to remain still due to ongoing complaints of leg pain if patient has neck symptoms consider repeat imaging once the patient is able to fully cooperate with imaging. Electronically Signed   By: Lavonia Dana M.D.   On: 12/26/2018 20:57   Dg C-arm 1-60 Min  Result Date: 12/27/2018 CLINICAL DATA:  The patient suffered a left intertrochanteric fracture in a fall 12/26/2018. Intraoperative imaging for fracture fixation. Initial encounter. EXAM: DG HIP (WITH OR WITHOUT PELVIS) 2-3V LEFT; DG C-ARM 61-120 MIN COMPARISON:  Plain films left hip 12/26/2018. FINDINGS: Four fluoroscopic intraoperative spot views demonstrate placement of a compression hip screw and short intramedullary nail with a single screw for fixation of an intertrochanteric fracture. Hardware is intact. Position and alignment of the patient's fracture are markedly improved. No acute abnormality. IMPRESSION: Intraoperative imaging for fixation of a left intertrochanteric fracture. No acute abnormality. Electronically Signed   By: Inge Rise M.D.   On: 12/27/2018 11:55   Dg Hip Unilat With Pelvis 2-3 Views Left  Result Date: 12/27/2018 CLINICAL DATA:  The  patient suffered a left intertrochanteric fracture in a fall 12/26/2018. Intraoperative imaging for fracture fixation. Initial encounter. EXAM: DG HIP (WITH OR WITHOUT PELVIS) 2-3V LEFT; DG C-ARM 61-120 MIN COMPARISON:  Plain films left hip 12/26/2018. FINDINGS: Four fluoroscopic intraoperative spot views demonstrate placement of a compression hip screw and short intramedullary nail with a single screw for fixation of an intertrochanteric fracture. Hardware is intact. Position and alignment of the patient's fracture are markedly improved. No acute abnormality. IMPRESSION: Intraoperative imaging for fixation of a left intertrochanteric fracture. No acute abnormality. Electronically Signed   By: Inge Rise M.D.   On: 12/27/2018 11:55   Dg Hip Unilat With Pelvis 2-3 Views Left  Result Date: 12/26/2018 CLINICAL DATA:  Fall on the left side EXAM: DG HIP (WITH OR WITHOUT PELVIS) 2-3V LEFT COMPARISON:  None. FINDINGS: Left hip reverse oblique fracture with 3.0 cm medial displacement of the distal fracture fragment, 2.4 cm of overlap, and extensive varus angulation. Severe degenerative arthropathy of the left hip. Moderate degenerative arthropathy of the right hip. IMPRESSION: 1. Considerably displaced, overlapped, and angulated reverse oblique fracture of the left hip. 2. Severe degenerative arthropathy of the left hip. Moderate degenerative arthropathy of the right hip. Electronically Signed   By: Van Clines M.D.   On: 12/26/2018 20:06    Micro Results    Recent Results (from the past 240 hour(s))  MRSA PCR Screening     Status: None   Collection Time: 12/27/18  9:13 AM  Result Value Ref Range Status   MRSA by PCR NEGATIVE NEGATIVE  Final    Comment:        The GeneXpert MRSA Assay (FDA approved for NASAL specimens only), is one component of a comprehensive MRSA colonization surveillance program. It is not intended to diagnose MRSA infection nor to guide or monitor treatment for MRSA  infections. Performed at [facility], [place] [address] [place], [place] [address]        Today   Subjective    Lemario Chaikin today has no NEW concerns, patient seen with occupational therapist at bedside...  Phone conference with patient's sister who prefers discharge home with home health rather than going to SNF rehab  Face-to-face conference with social worker and case manager, as well as occupational therapist--- may discharge home with home health and with precautions as outlined in discharge instructions   Patient has been seen and examined prior to discharge   Objective   Blood pressure 104/68, pulse (!) 102, temperature 97.7 F (36.5 C), temperature source Oral, resp. rate (!) 22, height 6\' 5"  (1.956 m), weight 87.5 kg, SpO2 97 %.   Intake/Output Summary (Last 24 hours) at 12/29/2018 1120 Last data filed at 12/29/2018 0900 Gross per 24 hour  Intake 240 ml  Output -  Net 240 ml    Exam Gen:- Awake Alert, no acute distress, rather tall HEENT:- [place].AT, No sclera icterus Neck-Supple Neck,No JVD,.  Lungs-  CTAB , fair symmetrical air movement CV- S1, S2 normal, regular  Abd-  +ve B.Sounds, Abd Soft, No tenderness,    Extremity/Skin:- No  edema, pedal pulses present  Psych-affect is appropriate, , patient with baseline cognitive deficits due to developmental delay Neuro-no new focal deficits, no tremors MSK--left hip post op site clean dressing----please see updated Occupational Therapy notes dated 12/29/2018   Data Review   CBC w Diff:  Lab Results  Component Value Date   WBC 11.3 (H) 12/28/2018   HGB 13.0 12/28/2018   HCT 37.8 (L) 12/28/2018   PLT 201 12/28/2018   LYMPHOPCT 8 12/26/2018   MONOPCT 8 12/26/2018   EOSPCT 0 12/26/2018   BASOPCT 0 12/26/2018    CMP:  Lab Results  Component Value Date   NA 135 12/28/2018   K 3.9 12/28/2018   CL 105 12/28/2018   CO2 23 12/28/2018   BUN 10 12/28/2018   CREATININE 0.63 12/28/2018   PROT 8.0  08/21/2016   ALBUMIN 4.1 08/21/2016   BILITOT 1.8 (H) 08/21/2016   ALKPHOS 53 08/21/2016   AST 19 08/21/2016   ALT 20 08/21/2016  .   Total Discharge time is about 33 minutes  Roxan Hockey M.D on 12/29/2018 at 11:20 AM  Pager---[phone]  Go to [email or web address] - password [email or web address] for contact info  [facility] - Office  [phone]

## 2018-12-29 NOTE — Progress Notes (Addendum)
CSW lvm with patient sister [facility]) to consult regarding SNF options.   CSW awaiting call back to move forward with SNF facility choice. BCBS insurance Josem Kaufmann has been initiated, [employer] number pending.   CSW will continue to follow up.   [place], [place]

## 2018-12-29 NOTE — Progress Notes (Signed)
PASRR number recieved:  [id number] [place], [place]

## 2018-12-29 NOTE — Care Management Important Message (Signed)
Important Message  Patient Details  Name: Joshua Moyer MRN: [id number] Date of Birth: 1961-10-27   Medicare Important Message Given:       [name] 12/29/2018, 3:33 PM

## 2018-12-29 NOTE — Progress Notes (Addendum)
Physical Therapy Treatment Patient Details Name: Joshua Moyer MRN: [id number] DOB: 06/09/1961 Today's Date: 12/29/2018    History of Present Illness 58 y.o. male with medical history significant of A.Fib just on ASA 81, broken neck in 96 s/p ORIF, developmental delay.  Patient presents to the ED with severe L hip pain following a fall.  Going down a few wooden steps, slipped, fell, landed on L hip. Imaging revealed L hip fx, s/p L hip IM nail     PT Comments    Patient seen for mobility progression. Pt is making progress toward PT goals and tolerated increased gait distance. Pt does continue to require grossly min A  for gait training and min/mod A to ascend/descend 2 steps.  Pt needs to be at mod I level before d/c home given pt's lack of caregiver assistance and history of falls. Continue to recommend SNF for further skilled PT services to maximize independence and safety with mobility.     Follow Up Recommendations  SNF;Other (comment)     Equipment Recommendations  Rolling walker with 5" wheels    Recommendations for Other Services       Precautions / Restrictions Precautions Precautions: Fall Restrictions Weight Bearing Restrictions: Yes LLE Weight Bearing: Weight bearing as tolerated    Mobility  Bed Mobility               General bed mobility comments: Pt received in chair upon arrival.  Transfers Overall transfer level: Needs assistance Equipment used: Rolling walker (2 wheeled) Transfers: Sit to/from Stand Sit to Stand: Min guard;Min assist         General transfer comment: assist to steady and cues for safe hand placement  Ambulation/Gait Ambulation/Gait assistance: Min assist;Mod assist(chair follow) Gait Distance (Feet): (~130 ft total with standing rest breaks due to fatigue) Assistive device: Rolling walker (2 wheeled) Gait Pattern/deviations: Decreased stance time - left;Decreased step length - right;Decreased step length - left;Step-to  pattern;Antalgic Gait velocity: decreased   General Gait Details: pt with slow, guarded movements; cues for posture and stride length; assist for balance and increased assistance required initial 30 ft    Stairs Stairs: Yes Stairs assistance: Min assist;Mod assist Stair Management: One rail Left Number of Stairs: 2 General stair comments: cues for sequencing, technique, and safety; min A to ascend and mod A to descend   Wheelchair Mobility    Modified Rankin (Stroke Patients Only)       Balance Overall balance assessment: Needs assistance;Modified Independent Sitting-balance support: Feet supported;No upper extremity supported Sitting balance-Leahy Scale: Good     Standing balance support: Bilateral upper extremity supported Standing balance-Leahy Scale: Poor                              Cognition Arousal/Alertness: Awake/alert Behavior During Therapy: WFL for tasks assessed/performed Overall Cognitive Status: History of cognitive impairments - at baseline                                        Exercises      General Comments        Pertinent Vitals/Pain Pain Assessment: Faces Faces Pain Scale: Hurts even more Pain Location: L LE with mobility Pain Descriptors / Indicators: Sore;Guarding;Grimacing Pain Intervention(s): Limited activity within patient's tolerance;Monitored during session;Premedicated before session;Repositioned;Patient requesting pain meds-RN notified    Home Living  Prior Function            PT Goals (current goals can now be found in the care plan section) Acute Rehab PT Goals Patient Stated Goal: get back to [employer] practice Progress towards PT goals: Progressing toward goals    Frequency    Min 5X/week      PT Plan Current plan remains appropriate    Co-evaluation              AM-PAC PT "6 Clicks" Mobility   Outcome Measure  Help needed turning from  your back to your side while in a flat bed without using bedrails?: A Little Help needed moving from lying on your back to sitting on the side of a flat bed without using bedrails?: A Little Help needed moving to and from a bed to a chair (including a wheelchair)?: A Little Help needed standing up from a chair using your arms (e.g., wheelchair or bedside chair)?: A Little Help needed to walk in hospital room?: A Little Help needed climbing 3-5 steps with a railing? : A Lot 6 Click Score: 17    End of Session Equipment Utilized During Treatment: Gait belt Activity Tolerance: Patient tolerated treatment well Patient left: in chair;with call bell/phone within reach;with chair alarm set Nurse Communication: Mobility status PT Visit Diagnosis: Unsteadiness on feet (R26.81);Other abnormalities of gait and mobility (R26.89);History of falling (Z91.81);Muscle weakness (generalized) (M62.81);Difficulty in walking, not elsewhere classified (R26.2);Pain Pain - Right/Left: Left Pain - part of body: Hip     Time: 2633-3545 PT Time Calculation (min) (ACUTE ONLY): 27 min  Charges:  $Gait Training: 23-37 mins                     Earney Navy, PTA Acute Rehabilitation Services Pager: [phone] Office: [phone]     Darliss Cheney 12/29/2018, 2:56 PM

## 2018-12-29 NOTE — NC FL2 (Signed)
[place] LEVEL OF CARE SCREENING TOOL     IDENTIFICATION  Patient Name: Joshua Moyer Birthdate: 1961-03-13 Sex: male Admission Date (Current Location): 12/26/2018  [facility] and [place] Number:  Herbalist and Address:  [facility], [place] [address] [place], [place] [address]      Provider Number: [id number]  Attending Physician Name and Address:  Roxan Hockey, MD  Relative Name and Phone Number:  [name] (sister) [phone]    Current Level of Care: Hospital Recommended Level of Care: [place] Prior Approval Number:    Date Approved/Denied:   PASRR Number: (still pending)  Discharge Plan: SNF    Current Diagnoses: Patient Active Problem List   Diagnosis Date Noted  . Closed left hip fracture ([facility]) 12/26/2018  . Appendicitis 11/04/2016  . Atrial fibrillation - chronic, rate-contreolled 08/23/2016  . Cognitive developmental delay   . Acute appendicitis with peritoneal abscess 08/22/2016  . Atrial flutter ([place]) 05/15/2010    Orientation RESPIRATION BLADDER Height & Weight     Self, Time, Situation, Place  Normal Continent Weight: 87.5 kg Height:  6\' 5"  (195.6 cm)  BEHAVIORAL SYMPTOMS/MOOD NEUROLOGICAL BOWEL NUTRITION STATUS      Continent Diet(see discharge summary)  AMBULATORY STATUS COMMUNICATION OF NEEDS Skin   Extensive Assist Verbally Surgical wounds(left hip closed surgical incision, abdomen closed incision)                       Personal Care Assistance Level of Assistance  Bathing, Feeding, Dressing, Total care Bathing Assistance: Maximum assistance Feeding assistance: Independent Dressing Assistance: Maximum assistance Total Care Assistance: Maximum assistance   Functional Limitations Info  Hearing, Speech, Sight Sight Info: Adequate Hearing Info: Adequate Speech Info: Adequate    SPECIAL CARE FACTORS FREQUENCY  PT (By licensed PT), OT (By licensed OT)     PT Frequency: min  5x weekly OT Frequency: min 3x weekly            Contractures Contractures Info: Not present    Additional Factors Info  Code Status, Allergies Code Status Info: Full Allergies Info: Chlorhexidine Gluconate (chlorhexidine), Statins           Current Medications (12/29/2018):  This is the current hospital active medication list Current Facility-Administered Medications  Medication Dose Route Frequency Provider Last Rate Last Dose  . 0.9 %  sodium chloride infusion   Intravenous Continuous Roxan Hockey, MD 75 mL/hr at 12/27/18 1458    . acetaminophen (TYLENOL) tablet 325-650 mg  325-650 mg Oral Q6H PRN Nicholes Stairs, MD      . aspirin chewable tablet 81 mg  81 mg Oral BID WC Emokpae, Courage, MD   81 mg at 12/29/18 0844  . diltiazem (CARDIZEM CD) 24 hr capsule 120 mg  120 mg Oral Daily Nicholes Stairs, MD   120 mg at 12/29/18 0843  . feeding supplement (ENSURE ENLIVE) (ENSURE ENLIVE) liquid 237 mL  237 mL Oral BID BM Emokpae, Courage, MD   237 mL at 12/29/18 0844  . fentaNYL (SUBLIMAZE) injection 50 mcg  50 mcg Intravenous Q2H PRN Nicholes Stairs, MD   50 mcg at 12/28/18 1723  . HYDROcodone-acetaminophen (NORCO) 7.5-325 MG per tablet 1-2 tablet  1-2 tablet Oral Q4H PRN Nicholes Stairs, MD      . HYDROcodone-acetaminophen (NORCO/VICODIN) 5-325 MG per tablet 1-2 tablet  1-2 tablet Oral Q6H PRN Nicholes Stairs, MD   2 tablet at 12/28/18 2238  .  HYDROcodone-acetaminophen (NORCO/VICODIN) 5-325 MG per tablet 1-2 tablet  1-2 tablet Oral Q4H PRN Nicholes Stairs, MD   2 tablet at 12/29/18 [phone]  . levothyroxine (SYNTHROID, LEVOTHROID) tablet 25 mcg  25 mcg Oral QAC breakfast Nicholes Stairs, MD   25 mcg at 12/29/18 [phone]  . methocarbamol (ROBAXIN) tablet 500 mg  500 mg Oral Q6H PRN Nicholes Stairs, MD   500 mg at 12/28/18 2237   Or  . methocarbamol (ROBAXIN) 500 mg in dextrose 5 % 50 mL IVPB  500 mg Intravenous Q6H PRN Nicholes Stairs, MD       . methocarbamol (ROBAXIN) tablet 500 mg  500 mg Oral Q6H PRN Nicholes Stairs, MD   500 mg at 12/29/18 0844   Or  . methocarbamol (ROBAXIN) 500 mg in dextrose 5 % 50 mL IVPB  500 mg Intravenous Q6H PRN Nicholes Stairs, MD      . metoCLOPramide [facility]) tablet 5-10 mg  5-10 mg Oral Q8H PRN Nicholes Stairs, MD       Or  . metoCLOPramide [facility]) injection 5-10 mg  5-10 mg Intravenous Q8H PRN Nicholes Stairs, MD      . multivitamin with minerals tablet 1 tablet  1 tablet Oral Daily Roxan Hockey, MD   1 tablet at 12/29/18 0843  . ondansetron (ZOFRAN) tablet 4 mg  4 mg Oral Q6H PRN Nicholes Stairs, MD       Or  . ondansetron [facility]) injection 4 mg  4 mg Intravenous Q6H PRN Nicholes Stairs, MD      . senna-docusate (Senokot-S) tablet 2 tablet  2 tablet Oral BID Roxan Hockey, MD   2 tablet at 12/29/18 0844  . tamsulosin (FLOMAX) capsule 0.4 mg  0.4 mg Oral QPC supper Nicholes Stairs, MD   0.4 mg at 12/28/18 1723     Discharge Medications: Please see discharge summary for a list of discharge medications.  Relevant Imaging Results:  Relevant Lab Results:   Additional Information SSN: [id number]  Alberteen Sam, LCSW

## 2018-12-29 NOTE — Plan of Care (Signed)
  Problem: Pain Managment: Goal: General experience of comfort will improve Outcome: Progressing   Problem: Safety: Goal: Ability to remain free from injury will improve Outcome: Progressing   Problem: Skin Integrity: Goal: Risk for impaired skin integrity will decrease Outcome: Progressing   

## 2018-12-29 NOTE — Progress Notes (Signed)
Occupational Therapy Treatment Patient Details Name: Joshua Moyer MRN: [id number] DOB: 03/16/1961 Today's Date: 12/29/2018    History of present illness 58 y.o. male with medical history significant of A.Fib just on ASA 81, broken neck in 96 s/p ORIF, developmental delay.  Patient presents to the ED with severe L hip pain following a fall.  Going down a few wooden steps, slipped, fell, landed on L hip. Imaging revealed L hip fx, s/p L hip IM nail    OT comments  Pt progressing towards OT goals. Today's session addressed safe ambulation in home setting for safe tub transfers. Pt educated on entering and exiting tub with visual/ verbal demonstration and handout provided. Pt recalled strategies and demonstrated with teachback. Pt required Min guard to Min A for safety when entering and exiting tub. Pt required min verbal cueing for sequencing and hand placement with transition to 3 n 1 commode. Pt ambulated in straight line in hallway to ensure safe ambulation for transition to home setting. Pt will require assistance at home for transfers in home setting for dynamic standing balance to ensure safety.    Follow Up Recommendations  Home health OT;Supervision/Assistance - 24 hour;SNF(If support not available SNF may be appropriate.)    Equipment Recommendations  3 in 1 bedside commode    Recommendations for Other Services      Precautions / Restrictions Precautions Precautions: Fall Restrictions Weight Bearing Restrictions: Yes LLE Weight Bearing: Weight bearing as tolerated       Mobility Bed Mobility               General bed mobility comments: Pt received in chair upon arrival.  Transfers Overall transfer level: Needs assistance Equipment used: Rolling walker (2 wheeled) Transfers: Sit to/from Stand Sit to Stand: Supervision;Min guard         General transfer comment: supervision to min guard for power up and steadying in standing with RW     Balance Overall balance  assessment: Needs assistance;Modified Independent Sitting-balance support: Feet supported;No upper extremity supported Sitting balance-Leahy Scale: Good     Standing balance support: Bilateral upper extremity supported Standing balance-Leahy Scale: Poor Standing balance comment: Pt demonstrates good ambulation walking in straight line with RW; requires supervision for dynamic standing balance.                           ADL either performed or assessed with clinical judgement   ADL Overall ADL's : Needs assistance/impaired                         Toilet Transfer: Supervision/safety;Min guard;Cueing for safety;Cueing for sequencing;RW;Ambulation       Tub/ Shower Transfer: Tub transfer;Minimal assistance;Cueing for safety;Cueing for sequencing;3 in 1;Rolling walker;Ambulation Tub/Shower Transfer Details (indicate cue type and reason): Pt educated on entering and exiting tub with visual/ verbal demonstration and handout provided. Pt recalled strategies and demonstrated with teachback. Pt required MInA for safety when entering and exiting tub. Pt required min verbal cueing for sequencing and hand placement with transition to 3 n 1 commode. Handout provided for patient education and visual aide. Functional mobility during ADLs: Supervision/safety;Min guard;Rolling walker General ADL Comments: Sponge bathing discussed for safety since tubs are upstairs in home setting.  Pt demonstrates improvment in ambulation however requires supervision to min guard to ensure safety with RW. Pt requires VCs for sequencing for safety awareness.       Vision  Perception     Praxis      Cognition Arousal/Alertness: Awake/alert Behavior During Therapy: WFL for tasks assessed/performed Overall Cognitive Status: History of cognitive impairments - at baseline                                 General Comments: developmental delays, however able to supply info on prior  function and Home living without difficulty         Exercises     Shoulder Instructions       General Comments Pt demonstrates improved ambulation with RW however supervision to min guard required for dynamic movement with RW (turning/ shifting)    Pertinent Vitals/ Pain       Pain Assessment: 0-10 Pain Score: 8  Pain Location: L leg  Pain Descriptors / Indicators: Aching;Sore  Home Living                                          Prior Functioning/Environment              Frequency  Min 2X/week        Progress Toward Goals  OT Goals(current goals can now be found in the care plan section)  Progress towards OT goals: Progressing toward goals  Acute Rehab OT Goals Patient Stated Goal: get back to [employer] practice OT Goal Formulation: With patient Time For Goal Achievement: 01/11/19 Potential to Achieve Goals: Good ADL Goals Pt Will Perform Grooming: with supervision;standing Pt Will Perform Lower Body Bathing: with supervision;sit to/from stand Pt Will Perform Lower Body Dressing: with supervision;with adaptive equipment;sit to/from stand Pt Will Transfer to Toilet: with modified independence;ambulating Pt Will Perform Toileting - Clothing Manipulation and hygiene: with modified independence;sit to/from stand  Plan Discharge plan remains appropriate    Co-evaluation                 AM-PAC OT "6 Clicks" Daily Activity     Outcome Measure   Help from another person eating meals?: None Help from another person taking care of personal grooming?: None Help from another person toileting, which includes using toliet, bedpan, or urinal?: A Little Help from another person bathing (including washing, rinsing, drying)?: A Little Help from another person to put on and taking off regular upper body clothing?: None Help from another person to put on and taking off regular lower body clothing?: A Little 6 Click Score: 21    End of  Session Equipment Utilized During Treatment: Gait belt;Rolling walker  OT Visit Diagnosis: Unsteadiness on feet (R26.81);History of falling (Z91.81)   Activity Tolerance Patient tolerated treatment well   Patient Left in chair;with call bell/phone within reach   Nurse Communication Mobility status;Weight bearing status        Time: 9357-0177 OT Time Calculation (min): 40 min  Charges: OT General Charges $OT Visit: 1 Visit OT Treatments $Self Care/Home Management : 38-52 mins  Minus Breeding, MSOT, OTR/L  Supplemental Rehabilitation Services  [phone]   Marius Ditch 12/29/2018, 10:46 AM

## 2018-12-29 NOTE — Plan of Care (Signed)
  Problem: Activity: Goal: Ability to ambulate and perform ADLs will improve Outcome: Progressing   Problem: Pain Management: Goal: Pain level will decrease Outcome: Progressing   Problem: Education: Goal: Knowledge of General Education information will improve Description: Including pain rating scale, medication(s)/side effects and non-pharmacologic comfort measures Outcome: Progressing   

## 2018-12-30 DIAGNOSIS — R202 Paresthesia of skin: Secondary | ICD-10-CM | POA: Diagnosis present

## 2018-12-30 DIAGNOSIS — S72002D Fracture of unspecified part of neck of left femur, subsequent encounter for closed fracture with routine healing: Secondary | ICD-10-CM | POA: Diagnosis not present

## 2018-12-30 DIAGNOSIS — Z79899 Other long term (current) drug therapy: Secondary | ICD-10-CM | POA: Diagnosis not present

## 2018-12-30 DIAGNOSIS — I4819 Other persistent atrial fibrillation: Secondary | ICD-10-CM | POA: Diagnosis not present

## 2018-12-30 DIAGNOSIS — K37 Unspecified appendicitis: Secondary | ICD-10-CM | POA: Diagnosis not present

## 2018-12-30 DIAGNOSIS — I82432 Acute embolism and thrombosis of left popliteal vein: Secondary | ICD-10-CM | POA: Diagnosis not present

## 2018-12-30 DIAGNOSIS — M2550 Pain in unspecified joint: Secondary | ICD-10-CM | POA: Diagnosis not present

## 2018-12-30 DIAGNOSIS — I8291 Chronic embolism and thrombosis of unspecified vein: Secondary | ICD-10-CM | POA: Diagnosis not present

## 2018-12-30 DIAGNOSIS — S72043D Displaced fracture of base of neck of unspecified femur, subsequent encounter for closed fracture with routine healing: Secondary | ICD-10-CM | POA: Diagnosis not present

## 2018-12-30 DIAGNOSIS — Z7982 Long term (current) use of aspirin: Secondary | ICD-10-CM | POA: Diagnosis not present

## 2018-12-30 DIAGNOSIS — M7989 Other specified soft tissue disorders: Secondary | ICD-10-CM | POA: Diagnosis present

## 2018-12-30 DIAGNOSIS — K3533 Acute appendicitis with perforation and localized peritonitis, with abscess: Secondary | ICD-10-CM | POA: Diagnosis not present

## 2018-12-30 DIAGNOSIS — Y92009 Unspecified place in unspecified non-institutional (private) residence as the place of occurrence of the external cause: Secondary | ICD-10-CM | POA: Diagnosis not present

## 2018-12-30 DIAGNOSIS — S8012XA Contusion of left lower leg, initial encounter: Secondary | ICD-10-CM | POA: Diagnosis not present

## 2018-12-30 DIAGNOSIS — R590 Localized enlarged lymph nodes: Secondary | ICD-10-CM | POA: Diagnosis not present

## 2018-12-30 DIAGNOSIS — S72002A Fracture of unspecified part of neck of left femur, initial encounter for closed fracture: Secondary | ICD-10-CM | POA: Diagnosis not present

## 2018-12-30 DIAGNOSIS — R918 Other nonspecific abnormal finding of lung field: Secondary | ICD-10-CM | POA: Diagnosis not present

## 2018-12-30 DIAGNOSIS — E039 Hypothyroidism, unspecified: Secondary | ICD-10-CM | POA: Diagnosis not present

## 2018-12-30 DIAGNOSIS — Z7401 Bed confinement status: Secondary | ICD-10-CM | POA: Diagnosis not present

## 2018-12-30 DIAGNOSIS — F819 Developmental disorder of scholastic skills, unspecified: Secondary | ICD-10-CM | POA: Diagnosis not present

## 2018-12-30 DIAGNOSIS — K219 Gastro-esophageal reflux disease without esophagitis: Secondary | ICD-10-CM | POA: Diagnosis not present

## 2018-12-30 DIAGNOSIS — M6281 Muscle weakness (generalized): Secondary | ICD-10-CM | POA: Diagnosis not present

## 2018-12-30 DIAGNOSIS — I482 Chronic atrial fibrillation, unspecified: Secondary | ICD-10-CM | POA: Diagnosis not present

## 2018-12-30 DIAGNOSIS — M79662 Pain in left lower leg: Secondary | ICD-10-CM | POA: Diagnosis present

## 2018-12-30 DIAGNOSIS — R5381 Other malaise: Secondary | ICD-10-CM | POA: Diagnosis not present

## 2018-12-30 DIAGNOSIS — R52 Pain, unspecified: Secondary | ICD-10-CM | POA: Diagnosis not present

## 2018-12-30 DIAGNOSIS — R609 Edema, unspecified: Secondary | ICD-10-CM | POA: Diagnosis not present

## 2018-12-30 DIAGNOSIS — W19XXXD Unspecified fall, subsequent encounter: Secondary | ICD-10-CM | POA: Diagnosis not present

## 2018-12-30 DIAGNOSIS — W19XXXA Unspecified fall, initial encounter: Secondary | ICD-10-CM | POA: Diagnosis not present

## 2018-12-30 DIAGNOSIS — R911 Solitary pulmonary nodule: Secondary | ICD-10-CM | POA: Diagnosis not present

## 2018-12-30 NOTE — Discharge Summary (Signed)
Original Discharge summary done by Dr. Fonnie Birkenhead on 12/29/2018. Only change made is to the discharge date. No changes made to discharge instructions or medications.   Patient seen and examined today, stable for discharge.   Joshua Moyer, is a 58 y.o. male  DOB 06/15/1961  MRN [id number].  Admission date:  12/26/2018  Admitting Physician  Etta Quill, DO  Discharge Date:  12/30/2018   Primary MD  Hulan Fess, MD  Recommendations for primary care physician for things to follow:   1)Status- post left hip surgery--- weightbearing as tolerated to left lower extremity 2) use a walker, avoid falls/fall precautions 3)- maintain dressings until follow up, ok to get wet but do not submerge 4) aspirin 81 mg twice a day with food for deep vein thrombosis prophylaxis for 6 weeks post operatively and then back down to aspirin 81 mg daily after 6 weeks as previous for atrial fibrillation stroke prophylaxis  5)You are taking aspirin so Avoid ibuprofen/Advil/Aleve/Motrin/Goody Powders/Naproxen/BC powders/Meloxicam/Diclofenac/Indomethacin and other Nonsteroidal anti-inflammatory medications as these will make you more likely to bleed and can cause stomach ulcers, can also cause Kidney problems.  6) follow-up with orthopedic surgeon as advised in 2  weeks for recheck 7)Home health physical therapy and Occupational Therapy as ordered   - Ortho instructions: - ok for full weight bearing as tolerated tot he left leg - maintain post op bandage until follow up appointment.  You may shower in the bandage but do not submerge under water - return to see Dr. Stann Mainland in 2 weeks. - for the prevention of blood clots take an 81 mg twice daily for 6 weeks.   Admission Diagnosis  Pre-op chest exam [Z01.811] Closed fracture of left hip, initial encounter ([place]) [S72.002A] Fall in home, initial encounter [W19.Merril Abbe,  S92.330]   Discharge Diagnosis  Pre-op chest exam [Q76.226] Closed fracture of left hip, initial encounter ([place]) [S72.002A] Fall in home, initial encounter [W19.XXXA, J33.545]    Principal Problem:   Closed left hip fracture ([place]) Active Problems:   Cognitive developmental delay   Atrial fibrillation/Flutter - chronic, rate-controlled      Past Medical History:  Diagnosis Date  . Atrial fibrillation ([place])   . Atrial flutter ([place])   . Broken neck ([place])    In 1996  . Cognitive developmental delay   . Dysrhythmia   . GERD (gastroesophageal reflux disease)   . Hypothyroidism     Past Surgical History:  Procedure Laterality Date  . CARDIAC ELECTROPHYSIOLOGY STUDY AND ABLATION    . FEMUR IM NAIL Left 12/27/2018  . INGUINAL HERNIA REPAIR     right  . INTRAMEDULLARY (IM) NAIL INTERTROCHANTERIC Left 12/27/2018   Procedure: INTRAMEDULLARY (IM) NAIL, LEFT HIP;  Surgeon: Nicholes Stairs, MD;  Location: [facility];  Service: Orthopedics;  Laterality: Left;  . IR GENERIC HISTORICAL  09/10/2016   IR RADIOLOGIST EVAL & MGMT 09/10/2016 Corrie Mckusick, DO [facility] INTERV RAD  . LAPAROSCOPIC APPENDECTOMY N/A 11/04/2016   Procedure: APPENDECTOMY LAPAROSCOPIC;  Surgeon: Autumn Messing III, MD;  Location:  [facility];  Service: General;  Laterality: N/A;  . NECK SURGERY     broke neck in 1996       HPI  from the history and physical done on the day of admission:    Chief Complaint: Fall  HPI: Joshua Moyer is a 58 y.o. male with medical history significant of A.Fib just on ASA 81, broken neck in 96 s/p ORIF, developmental delay.  Patient presents to the ED with severe L hip pain following a fall.  Going down a few wooden steps, slipped, fell, landed on L hip.  Didn't hit head or neck.  Crawled back to steps and was eventually able to call for help from a passer by.   ED Course: L hip fx.  No neck pain and denies hitting head or nec, but got CT head and neck because EMS put him in a C.Collar.  Of  note patient was having chronic pain with L hip and limp on L side for which he was seeing PT recently.  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   Hospital Course:     Brief summary 59 y.o.malewith medical history significant ofA.Fib  on ASA 81,   developmental delay.   Admitted on 12/26/2018 with severe L hip pain following a fall. Going down a few wooden steps, slipped, fell, landed on L hip, status post ORIF on 12/27/2018  Plan:-  1)Lt HIP Fx-- s/p ORIF on 12/27/18, WBAT, follow instructions by Ortho team  2) chronic A. Fib--- not on anticoagulation (CHADs score= 0)..., Continue aspirin as outlined in the discharge instructions  3) developmental delay with baseline cognitive deficits--- supportive care  4) urinary retention--- Flomax as prescribed, patient needed in and out Foley with 1600 mL of urine removed, patient is voiding okay now, suspect he had postanesthesia/postop urinary retention which is now resolved, may continue Flomax for 1 month and then discontinue  5) hypothyroidism--- continue levothyroxine 25 mcg daily  Code Status : Full  Family Communication:   None at bedside   Disposition Plan  :  SNF Vs HH PT ---- discharge home pt and sister refuses SNF  ----    therapist recommended SNF but patient sister is leaning towards home with home health therapy  Consults  :  ortho  DVT Prophylaxis  : Aspirin 81 mg twice daily as per orthopedic team SCDs    Discharge Condition: stable  Follow UP   Contact information for follow-up providers    Nicholes Stairs, MD In 2 weeks.   Specialty:  Orthopedic Surgery Why:  For suture removal, For wound re-check Contact information: [address] [place] [address] [phone]        Home[facility] Follow up.   Specialty:  [place] Why:  A representative from [name] will contact you to arrange start date and time for your  therapy. Contact information: [address] [place] [address] [phone]            Contact information for after-discharge care    Destination    [facility] Preferred SNF .   Service:  Skilled Nursing Contact information: [address] [place] [phone]                 Diet and Activity recommendation:  As advised  Discharge Instructions    Discharge Instructions    Call MD for:  difficulty  breathing, headache or visual disturbances   Complete by:  As directed    Call MD for:  persistant dizziness or light-headedness   Complete by:  As directed    Call MD for:  persistant nausea and vomiting   Complete by:  As directed    Call MD for:  severe uncontrolled pain   Complete by:  As directed    Call MD for:  temperature >100.4   Complete by:  As directed    Diet - low sodium heart healthy   Complete by:  As directed    Discharge instructions   Complete by:  As directed    1)Status- post left hip surgery--- weightbearing as tolerated to left lower extremity 2) use a walker, avoid falls/fall precautions 3)- maintain dressings until follow up, ok to get wet but do not submerge 4) aspirin 81 mg twice a day with food for deep vein thrombosis prophylaxis for 6 weeks post operatively and then back down to aspirin 81 mg daily after 6 weeks as previous for atrial fibrillation stroke prophylaxis  5)You are taking aspirin so Avoid ibuprofen/Advil/Aleve/Motrin/Goody Powders/Naproxen/BC powders/Meloxicam/Diclofenac/Indomethacin and other Nonsteroidal anti-inflammatory medications as these will make you more likely to bleed and can cause stomach ulcers, can also cause Kidney problems.  6) follow-up with orthopedic surgeon as advised in 2  weeks for recheck 7)Home health physical therapy and Occupational Therapy as ordered   - Ortho instructions: - ok for full weight bearing as tolerated tot he left leg - maintain post  op bandage until follow up appointment.  You may shower in the bandage but do not submerge under water - return to see Dr. Stann Mainland in 2 weeks. - for the prevention of blood clots take an 81 mg twice daily for 6 weeks.   Increase activity slowly   Complete by:  As directed    Fall precautions        Discharge Medications     Allergies as of 12/30/2018      Reactions   Chlorhexidine Gluconate [chlorhexidine] Itching   Statins Other (See Comments)   Leg Pain       Medication List    STOP taking these medications   aspirin EC 81 MG tablet Replaced by:  aspirin 81 MG chewable tablet   naproxen sodium 220 MG tablet Commonly known as:  ALEVE     TAKE these medications   aspirin 81 MG chewable tablet Chew 1 tablet (81 mg total) by mouth 2 (two) times daily with a meal. For 6 weeks and then after that only once daily with breakfast Replaces:  aspirin EC 81 MG tablet   b complex vitamins tablet Take 1 tablet by mouth daily.   cholecalciferol 1000 units tablet Commonly known as:  VITAMIN D Take 1,000 Units by mouth daily.   diltiazem 120 MG 24 hr capsule Commonly known as:  TIAZAC Take 120 mg by mouth daily.   HYDROcodone-acetaminophen 5-325 MG tablet Commonly known as:  NORCO/VICODIN Take 1-2 tablets by mouth every 6 (six) hours as needed for moderate pain.   levothyroxine 25 MCG tablet Commonly known as:  SYNTHROID, LEVOTHROID Take 25 mcg by mouth daily before breakfast.   methocarbamol 500 MG tablet Commonly known as:  ROBAXIN Take 1 tablet (500 mg total) by mouth 3 (three) times daily.   multivitamin with minerals Tabs tablet Take 1 tablet by mouth daily.   NON FORMULARY Gaba 500 mg-Take one po daily   ondansetron 4 MG tablet Commonly known as:  ZOFRAN Take 1 tablet (4 mg total) by mouth every 6 (six) hours as needed for nausea.   OVER THE COUNTER MEDICATION Take 1 tablet by mouth daily. "Gaba" supplement   senna-docusate 8.6-50 MG tablet Commonly known  as:  Senokot-S Take 2 tablets by mouth at bedtime.   tamsulosin 0.4 MG Caps capsule Commonly known as:  FLOMAX Take 1 capsule (0.4 mg total) by mouth daily after supper.   vitamin C 1000 MG tablet Take 1,000 mg by mouth daily.   vitamin E 400 UNIT capsule Take 400 Units by mouth daily.            Durable Medical Equipment  (From admission, onward)         Start     Ordered   12/28/18 1059  For home use only DME Walker rolling  Once    Question:  Patient needs a walker to treat with the following condition  Answer:  S/p left hip fracture   12/28/18 1059          Major procedures and Radiology Reports - PLEASE review detailed and final reports for all details, in brief -   Dg Chest 1 View  Result Date: 12/26/2018 CLINICAL DATA:  Fall on left side EXAM: CHEST  1 VIEW COMPARISON:  None. FINDINGS: Heart size within normal limits. The lungs appear clear. No obvious acute rib discontinuity or other appreciable fracture. No blunting of the costophrenic angles. IMPRESSION: 1.  No significant abnormality identified. Electronically Signed   By: Van Clines M.D.   On: 12/26/2018 20:07   Ct Head Wo Contrast  Result Date: 12/26/2018 CLINICAL DATA:  Tripped and fell today EXAM: CT HEAD WITHOUT CONTRAST CT CERVICAL SPINE WITHOUT CONTRAST TECHNIQUE: Multidetector CT imaging of the head and cervical spine was performed following the standard protocol without intravenous contrast. Multiplanar CT image reconstructions of the cervical spine were also generated. COMPARISON:  None FINDINGS: CT HEAD FINDINGS Brain: Scattered motion artifacts. Normal ventricular morphology. No midline shift or mass effect. Normal appearance of brain parenchyma. No intracranial hemorrhage, mass lesion or evidence of acute infarction. No extra-axial fluid collections. Vascular: No hyperdense vessels. Skull: Skull appears intact. Sinuses/Orbits: Mild mucosal thickening in the maxillary sinuses. Remaining paranasal  sinuses and mastoid air cells clear. Other: N/A CT CERVICAL SPINE FINDINGS Alignment: Exam significantly degraded by patient motion. Minimal retrolisthesis at C5-C6. No additional gross cervical mild alignment identified. Skull base and vertebrae: Disc space narrowing at C5-C6 and C3-C4. Endplate spur formation at C5-C6. Encroachment upon cervical neural foramina bilaterally at C5-C6 by uncovertebral and facet hypertrophy. Multilevel facet degenerative changes. Vertebral body heights appear grossly maintained. No gross fracture is identified. Prior posterior fusion of C1-C2. Uncovertebral spurs also encroach upon the LEFT C3-C4 and RIGHT C6-C7 foramina, to lesser degrees at additional levels. Soft tissues and spinal canal: Prevertebral soft tissues grossly normal thickness Disc levels:  No additional gross abnormalities Upper chest: Tips of lung apices clear. Other: N/A IMPRESSION: No acute intracranial abnormalities. 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 Postoperative changes of posterior fusion at C1-C2. No gross acute cervical spine abnormalities are identified though the exam is severely limited secondary to patient motion, unable to remain still due to ongoing complaints of leg pain if patient has neck symptoms consider repeat imaging once the patient is able to fully cooperate with imaging. Electronically Signed   By: Lavonia Dana M.D.   On: 12/26/2018 20:57   Ct Cervical Spine Wo Contrast  Result Date: 12/26/2018 CLINICAL DATA:  Tripped and fell today EXAM: CT HEAD WITHOUT CONTRAST CT CERVICAL SPINE WITHOUT CONTRAST TECHNIQUE: Multidetector CT imaging of the head and cervical spine was performed following the standard protocol without intravenous contrast. Multiplanar CT image reconstructions of the cervical spine were also generated. COMPARISON:  None FINDINGS: CT HEAD FINDINGS Brain: Scattered motion artifacts. Normal ventricular morphology. No midline shift or mass  effect. Normal appearance of brain parenchyma. No intracranial hemorrhage, mass lesion or evidence of acute infarction. No extra-axial fluid collections. Vascular: No hyperdense vessels. Skull: Skull appears intact. Sinuses/Orbits: Mild mucosal thickening in the maxillary sinuses. Remaining paranasal sinuses and mastoid air cells clear. Other: N/A CT CERVICAL SPINE FINDINGS Alignment: Exam significantly degraded by patient motion. Minimal retrolisthesis at C5-C6. No additional gross cervical mild alignment identified. Skull base and vertebrae: Disc space narrowing at C5-C6 and C3-C4. Endplate spur formation at C5-C6. Encroachment upon cervical neural foramina bilaterally at C5-C6 by uncovertebral and facet hypertrophy. Multilevel facet degenerative changes. Vertebral body heights appear grossly maintained. No gross fracture is identified. Prior posterior fusion of C1-C2. Uncovertebral spurs also encroach upon the LEFT C3-C4 and RIGHT C6-C7 foramina, to lesser degrees at additional levels. Soft tissues and spinal canal: Prevertebral soft tissues grossly normal thickness Disc levels:  No additional gross abnormalities Upper chest: Tips of lung apices clear. Other: N/A IMPRESSION: No acute intracranial abnormalities. 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 Postoperative changes of posterior fusion at C1-C2. No gross acute cervical spine abnormalities are identified though the exam is severely limited secondary to patient motion, unable to remain still due to ongoing complaints of leg pain if patient has neck symptoms consider repeat imaging once the patient is able to fully cooperate with imaging. Electronically Signed   By: Lavonia Dana M.D.   On: 12/26/2018 20:57   Dg C-arm 1-60 Min  Result Date: 12/27/2018 CLINICAL DATA:  The patient suffered a left intertrochanteric fracture in a fall 12/26/2018. Intraoperative imaging for fracture fixation. Initial encounter. EXAM: DG HIP  (WITH OR WITHOUT PELVIS) 2-3V LEFT; DG C-ARM 61-120 MIN COMPARISON:  Plain films left hip 12/26/2018. FINDINGS: Four fluoroscopic intraoperative spot views demonstrate placement of a compression hip screw and short intramedullary nail with a single screw for fixation of an intertrochanteric fracture. Hardware is intact. Position and alignment of the patient's fracture are markedly improved. No acute abnormality. IMPRESSION: Intraoperative imaging for fixation of a left intertrochanteric fracture. No acute abnormality. Electronically Signed   By: Inge Rise M.D.   On: 12/27/2018 11:55   Dg Hip Unilat With Pelvis 2-3 Views Left  Result Date: 12/27/2018 CLINICAL DATA:  The patient suffered a left intertrochanteric fracture in a fall 12/26/2018. Intraoperative imaging for fracture fixation. Initial encounter. EXAM: DG HIP (WITH OR WITHOUT PELVIS) 2-3V LEFT; DG C-ARM 61-120 MIN COMPARISON:  Plain films left hip 12/26/2018. FINDINGS: Four fluoroscopic intraoperative spot views demonstrate placement of a compression hip screw and short intramedullary nail with a single screw for fixation of an intertrochanteric fracture. Hardware is intact. Position and alignment of the patient's fracture are markedly improved. No acute abnormality. IMPRESSION: Intraoperative imaging for fixation of a left intertrochanteric fracture. No acute abnormality. Electronically Signed   By: Inge Rise M.D.   On: 12/27/2018 11:55   Dg Hip Unilat With Pelvis 2-3 Views Left  Result Date: 12/26/2018 CLINICAL DATA:  Fall on the left side EXAM: DG HIP (WITH OR WITHOUT PELVIS) 2-3V LEFT COMPARISON:  None. FINDINGS: Left hip  reverse oblique fracture with 3.0 cm medial displacement of the distal fracture fragment, 2.4 cm of overlap, and extensive varus angulation. Severe degenerative arthropathy of the left hip. Moderate degenerative arthropathy of the right hip. IMPRESSION: 1. Considerably displaced, overlapped, and angulated reverse  oblique fracture of the left hip. 2. Severe degenerative arthropathy of the left hip. Moderate degenerative arthropathy of the right hip. Electronically Signed   By: Van Clines M.D.   On: 12/26/2018 20:06    Micro Results    Recent Results (from the past 240 hour(s))  MRSA PCR Screening     Status: None   Collection Time: 12/27/18  9:13 AM  Result Value Ref Range Status   MRSA by PCR NEGATIVE NEGATIVE Final    Comment:        The GeneXpert MRSA Assay (FDA approved for NASAL specimens only), is one component of a comprehensive MRSA colonization surveillance program. It is not intended to diagnose MRSA infection nor to guide or monitor treatment for MRSA infections. Performed at [facility], [place] [address] [place], [place] [address]        Today   Subjective    Darcel Frane today has no NEW concerns, patient seen with occupational therapist at bedside...  Phone conference with patient's sister who prefers discharge home with home health rather than going to SNF rehab  Face-to-face conference with social worker and case manager, as well as occupational therapist--- may discharge home with home health and with precautions as outlined in discharge instructions   Patient has been seen and examined prior to discharge   Objective   Blood pressure 103/75, pulse 86, temperature (!) 97.5 F (36.4 C), temperature source Oral, resp. rate 16, height 6\' 5"  (1.956 m), weight 87.5 kg, SpO2 96 %.   Intake/Output Summary (Last 24 hours) at 12/30/2018 1045 Last data filed at 12/29/2018 1700 Gross per 24 hour  Intake 600 ml  Output -  Net 600 ml    Exam Gen:- Awake Alert, no acute distress, rather tall HEENT:- [place].AT, No sclera icterus Neck-Supple Neck,No JVD,.  Lungs-  CTAB , fair symmetrical air movement CV- S1, S2 normal, regular  Abd-  +ve B.Sounds, Abd Soft, No tenderness,    Extremity/Skin:- No  edema, pedal pulses present  Psych-affect is appropriate, ,  patient with baseline cognitive deficits due to developmental delay Neuro-no new focal deficits, no tremors MSK--left hip post op site clean dressing----please see updated Occupational Therapy notes dated 12/29/2018   Data Review   CBC w Diff:  Lab Results  Component Value Date   WBC 11.3 (H) 12/28/2018   HGB 13.0 12/28/2018   HCT 37.8 (L) 12/28/2018   PLT 201 12/28/2018   LYMPHOPCT 8 12/26/2018   MONOPCT 8 12/26/2018   EOSPCT 0 12/26/2018   BASOPCT 0 12/26/2018    CMP:  Lab Results  Component Value Date   NA 135 12/28/2018   K 3.9 12/28/2018   CL 105 12/28/2018   CO2 23 12/28/2018   BUN 10 12/28/2018   CREATININE 0.63 12/28/2018   PROT 8.0 08/21/2016   ALBUMIN 4.1 08/21/2016   BILITOT 1.8 (H) 08/21/2016   ALKPHOS 53 08/21/2016   AST 19 08/21/2016   ALT 20 08/21/2016  .   Total Discharge time is about 33 minutes  Cristal Ford M.D on 12/30/2018 at 10:45 AM  Pager---[phone]  Go to [email or web address] - password [email or web address] for contact info  [facility] - Office  [phone]

## 2018-12-30 NOTE — Clinical Social Work Placement (Signed)
   CLINICAL SOCIAL WORK PLACEMENT  NOTE  Date:  12/30/2018  Patient Details  Name: Joshua Moyer MRN: [id number] Date of Birth: 02-27-1961  Clinical Social Work is seeking post-discharge placement for this patient at the [place] level of care (*CSW will initial, date and re-position this form in  chart as items are completed):      Patient/family provided with [place] Work Department's list of facilities offering this level of care within the geographic area requested by the patient (or if unable, by the patient's family).  Yes   Patient/family informed of their freedom to choose among providers that offer the needed level of care, that participate in Medicare, Medicaid or managed care program needed by the patient, have an available bed and are willing to accept the patient.      Patient/family informed of [facility]'s ownership interest in [facility] and [facility], as well as of the fact that they are under no obligation to receive care at these facilities.  PASRR submitted to EDS on       PASRR number received on 12/29/18     Existing PASRR number confirmed on       FL2 transmitted to all facilities in geographic area requested by pt/family on 12/29/18     FL2 transmitted to all facilities within larger geographic area on 12/29/18     Patient informed that his/her managed care company has contracts with or will negotiate with certain facilities, including the following:        Yes   Patient/family informed of bed offers received.  Patient chooses bed at [facility]     Physician recommends and patient chooses bed at      Patient to be transferred to [facility] on 12/30/18.  Patient to be transferred to facility by [facility]     Patient family notified on 12/30/18 of transfer.  Name of family member notified:  sister Deb     PHYSICIAN       Additional Comment:     _______________________________________________ Alberteen Sam, LCSW 12/30/2018, 11:42 AM

## 2018-12-30 NOTE — Clinical Social Work Note (Signed)
Clinical Social Work Assessment  Patient Details  Name: Joshua Moyer MRN: [id number] Date of Birth: 01/28/1961  Date of referral:  12/30/18               Reason for consult:  Discharge Planning                Permission sought to share information with:  Case Manager, Facility Sport and exercise psychologist, Family Supports Permission granted to share information::  Yes, Verbal Permission Granted  Name::     Joshua Moyer  Agency::  [facility]  Relationship::  sister  Contact Information:  [phone]  Housing/Transportation Living arrangements for the past 2 months:  [place] of Information:  Patient Patient Interpreter Needed:  None Criminal Activity/Legal Involvement Pertinent to Current Situation/Hospitalization:  No - Comment as needed Significant Relationships:  Siblings Lives with:  Self Do you feel safe going back to the place where you live?  No Need for family participation in patient care:  Yes (Comment)  Care giving concerns:  CSW received referral for possible SNF placement at time of discharge. Spoke with patient regarding possibility of SNF placement . Patient's  Family is currently unable to care for him at their home given patient's current needs and fall risk.  Patient and sister Joshua Moyer expressed understanding of PT recommendation and are agreeable to SNF placement at time of discharge. CSW to continue to follow and assist with discharge planning needs.     Social Worker assessment / plan:  Spoke with patient's sister Joshua Moyer who is POA  concerning possibility of rehab at [facility] before returning home. Preference for [facility].    Employment status:  Retired Nurse, adult PT Recommendations:  [place] / Referral to community resources:  [place]  Patient/Family's Response to care:  Patient's sister and POA Joshua Moyer  recognize need for rehab before returning home and are agreeable to a SNF in [place]. They report  preference for   [facility] . CSW explained insurance authorization process. Patient's family reported that they want patient to get stronger to be able to come back home.    Patient/Family's Understanding of and Emotional Response to Diagnosis, Current Treatment, and Prognosis:  Patient/family is realistic regarding therapy needs and expressed being hopeful for SNF placement. Patient expressed understanding of CSW role and discharge process as well as medical condition. No questions/concerns about plan or treatment.    Emotional Assessment Appearance:  Appears stated age Attitude/Demeanor/Rapport:  Gracious Affect (typically observed):  Accepting, Adaptable Orientation:  Oriented to Self, Oriented to Place, Oriented to  Time, Oriented to Situation Alcohol / Substance use:  Not Applicable Psych involvement (Current and /or in the community):  No (Comment)  Discharge Needs  Concerns to be addressed:  Discharge Planning Concerns Readmission within the last 30 days:  No Current discharge risk:  Dependent with Mobility Barriers to Discharge:  Continued Medical Work up   [employer], LCSW 12/30/2018, 11:39 AM

## 2018-12-30 NOTE — Progress Notes (Signed)
Patient will DC to: [place] date: 12/30/2018 Family notified: Deb Transport by: Corey Harold  Per MD patient ready for DC to [facility] . RN, patient, patient's family, and facility notified of DC. Discharge Summary sent to facility. RN given number for report [phone] Room 208A. DC packet on chart. Ambulance transport requested for patient.  CSW signing off.  [place], [place]

## 2018-12-30 NOTE — Progress Notes (Signed)
Physical Therapy Treatment Note Patient seen for mobility progression. Pt continues to require assistance for functional transfers and gait training. Continue to progress as tolerated with anticipated d/c to SNF for further skilled PT services.     12/30/18 1153  PT Visit Information  Last PT Received On 12/30/18  Assistance Needed +2 (chair follow )  History of Present Illness 58 y.o. male with medical history significant of A.Fib just on ASA 81, broken neck in 96 s/p ORIF, developmental delay.  Patient presents to the ED with severe L hip pain following a fall.  Going down a few wooden steps, slipped, fell, landed on L hip. Imaging revealed L hip fx, s/p L hip IM nail   Subjective Data  Patient Stated Goal get back to [employer] practice  Precautions  Precautions Fall  Restrictions  Weight Bearing Restrictions Yes  LLE Weight Bearing WBAT  Pain Assessment  Pain Assessment Faces  Faces Pain Scale 6  Pain Location L LE with mobility  Pain Descriptors / Indicators Sore;Guarding;Grimacing  Pain Intervention(s) Limited activity within patient's tolerance;Monitored during session;Repositioned;Premedicated before session  Cognition  Arousal/Alertness Awake/alert  Behavior During Therapy WFL for tasks assessed/performed  Overall Cognitive Status History of cognitive impairments - at baseline  Bed Mobility  General bed mobility comments Pt received in chair upon arrival.  Transfers  Overall transfer level Needs assistance  Equipment used Rolling walker (2 wheeled)  Transfers Sit to/from Stand  Sit to Stand Min guard;Min assist  General transfer comment assist to steady and cues for safe hand placement  Ambulation/Gait  Ambulation/Gait assistance Min assist  Gait Distance (Feet)  (120 ft with standing rest break)  Assistive device Rolling walker (2 wheeled)  Gait Pattern/deviations Decreased stance time - left;Decreased step length - right;Decreased step length - left;Step-to  pattern;Antalgic;Decreased dorsiflexion - left;Decreased weight shift to left  General Gait Details assist for balance; cues for increased bilat step lengths and posture/forward gaze; pt with very guarded movements and decreased cadence  Gait velocity decreased  Balance  Overall balance assessment Needs assistance;Modified Independent  Sitting-balance support Feet supported;No upper extremity supported  Sitting balance-Leahy Scale Good  Standing balance support Bilateral upper extremity supported  Standing balance-Leahy Scale Poor  Exercises  Exercises General Lower Extremity  Total Joint Exercises  Ankle Circles/Pumps AROM;Both  Quad Sets Strengthening;Both;10 reps  Short Arc Quad AROM;Left;10 reps  Heel Slides AAROM;Left;10 reps  Hip ABduction/ADduction AAROM;Left;10 reps  Knee Flexion AROM;Left;10 reps;Standing  Marching in Standing AROM;Left;10 reps;Standing  PT - End of Session  Equipment Utilized During Treatment Gait belt  Activity Tolerance Patient tolerated treatment well  Patient left in chair;with call bell/phone within reach;with chair alarm set  Nurse Communication Mobility status   PT - Assessment/Plan  PT Plan Current plan remains appropriate  PT Visit Diagnosis Unsteadiness on feet (R26.81);Other abnormalities of gait and mobility (R26.89);History of falling (Z91.81);Muscle weakness (generalized) (M62.81);Difficulty in walking, not elsewhere classified (R26.2);Pain  Pain - Right/Left Left  Pain - part of body Hip  PT Frequency (ACUTE ONLY) Min 5X/week  Follow Up Recommendations SNF;Other (comment)  PT equipment Rolling walker with 5" wheels  AM-PAC PT "6 Clicks" Mobility Outcome Measure (Version 2)  Help needed turning from your back to your side while in a flat bed without using bedrails? 3  Help needed moving from lying on your back to sitting on the side of a flat bed without using bedrails? 3  Help needed moving to and from a bed to a chair (including a  wheelchair)? 3  Help needed standing up from a chair using your arms (e.g., wheelchair or bedside chair)? 3  Help needed to walk in hospital room? 3  Help needed climbing 3-5 steps with a railing?  2  6 Click Score 17  Consider Recommendation of Discharge To: Home with HH  PT Goal Progression  Progress towards PT goals Progressing toward goals  PT Time Calculation  PT Start Time (ACUTE ONLY) 1124  PT Stop Time (ACUTE ONLY) 1145  PT Time Calculation (min) (ACUTE ONLY) 21 min  PT General Charges  $$ ACUTE PT VISIT 1 Visit  PT Treatments  $Gait Training 8-22 mins   Earney Navy, PTA Acute Rehabilitation Services Pager: [phone] Office: [phone]

## 2018-12-30 NOTE — Progress Notes (Signed)
Occupational Therapy Treatment Patient Details Name: Joshua Moyer MRN: [id number] DOB: 11-04-1961 Today's Date: 12/30/2018    History of present illness 58 y.o. male with medical history significant of A.Fib just on ASA 81, broken neck in 96 s/p ORIF, developmental delay.  Patient presents to the ED with severe L hip pain following a fall.  Going down a few wooden steps, slipped, fell, landed on L hip. Imaging revealed L hip fx, s/p L hip IM nail    OT comments  Pt progressing towards OT goals this session. Pt was able to perform transfers min guard assist for balance, toilet transfers at min guard with cues for safe hand placement with DME, peri care with set up. Pt required to sit for grooming tasks as he is currently unable to maintain static standing and functional tasks without LOB. Dc plan updated to SNF for safety and to maximize independence in ADL and transfers.    Follow Up Recommendations  SNF;Supervision/Assistance - 24 hour    Equipment Recommendations  3 in 1 bedside commode    Recommendations for Other Services      Precautions / Restrictions Precautions Precautions: Fall Restrictions Weight Bearing Restrictions: Yes LLE Weight Bearing: Weight bearing as tolerated       Mobility Bed Mobility               General bed mobility comments: Pt received in chair upon arrival.  Transfers Overall transfer level: Needs assistance Equipment used: Rolling walker (2 wheeled) Transfers: Sit to/from Stand Sit to Stand: Min guard;Min assist         General transfer comment: assist to steady and cues for safe hand placement    Balance Overall balance assessment: Needs assistance;Modified Independent Sitting-balance support: Feet supported;No upper extremity supported Sitting balance-Leahy Scale: Good     Standing balance support: Bilateral upper extremity supported Standing balance-Leahy Scale: Poor Standing balance comment: dependent on RW                            ADL either performed or assessed with clinical judgement   ADL Overall ADL's : Needs assistance/impaired     Grooming: Wash/dry hands;Wash/dry face;Oral care;Set up;Sitting Grooming Details (indicate cue type and reason): unable to maintain standing at sink without BUE support                 Toilet Transfer: Min guard;Ambulation;RW Toilet Transfer Details (indicate cue type and reason): cues for  safety with RW Toileting- Clothing Manipulation and Hygiene: Min guard               Vision       Perception     Praxis      Cognition Arousal/Alertness: Awake/alert Behavior During Therapy: WFL for tasks assessed/performed Overall Cognitive Status: History of cognitive impairments - at baseline                                          Exercises     Shoulder Instructions       General Comments      Pertinent Vitals/ Pain       Faces Pain Scale: Hurts even more Pain Location: L LE with mobility Pain Descriptors / Indicators: Sore;Guarding;Grimacing  Home Living  Prior Functioning/Environment              Frequency  Min 2X/week        Progress Toward Goals  OT Goals(current goals can now be found in the care plan section)  Progress towards OT goals: Progressing toward goals  Acute Rehab OT Goals Patient Stated Goal: get back to [employer] practice OT Goal Formulation: With patient Time For Goal Achievement: 01/11/19 Potential to Achieve Goals: Good  Plan Discharge plan needs to be updated    Co-evaluation                 AM-PAC OT "6 Clicks" Daily Activity     Outcome Measure   Help from another person eating meals?: None Help from another person taking care of personal grooming?: None Help from another person toileting, which includes using toliet, bedpan, or urinal?: A Little Help from another person bathing (including  washing, rinsing, drying)?: A Little Help from another person to put on and taking off regular upper body clothing?: None Help from another person to put on and taking off regular lower body clothing?: A Little 6 Click Score: 21    End of Session Equipment Utilized During Treatment: Gait belt;Rolling walker  OT Visit Diagnosis: Unsteadiness on feet (R26.81);History of falling (Z91.81)   Activity Tolerance Patient tolerated treatment well   Patient Left in chair;with call bell/phone within reach;with chair alarm set   Nurse Communication Mobility status;Weight bearing status        Time: 1031-1050 OT Time Calculation (min): 19 min  Charges: OT General Charges $OT Visit: 1 Visit OT Treatments $Self Care/Home Management : 8-22 mins  Hulda Humphrey OTR/L Acute Rehabilitation Services Pager: [phone] Office: [place] 12/30/2018, 4:44 PM

## 2018-12-30 NOTE — Progress Notes (Signed)
Patient discharging to [facility] care. Report called in to [facility] LPN. Awaiting for [facility]AR to transport.

## 2019-01-03 ENCOUNTER — Encounter (HOSPITAL_COMMUNITY): Payer: Self-pay | Admitting: Emergency Medicine

## 2019-01-03 ENCOUNTER — Emergency Department (HOSPITAL_COMMUNITY): Payer: Medicare Other

## 2019-01-03 ENCOUNTER — Observation Stay (HOSPITAL_COMMUNITY)
Admission: EM | Admit: 2019-01-03 | Discharge: 2019-01-05 | Disposition: A | Discharge status: Skilled Nursing Facility | Payer: Medicare Other | Attending: Internal Medicine | Admitting: Internal Medicine

## 2019-01-03 ENCOUNTER — Emergency Department (HOSPITAL_BASED_OUTPATIENT_CLINIC_OR_DEPARTMENT_OTHER): Payer: Medicare Other

## 2019-01-03 DIAGNOSIS — R590 Localized enlarged lymph nodes: Secondary | ICD-10-CM | POA: Diagnosis not present

## 2019-01-03 DIAGNOSIS — I82432 Acute embolism and thrombosis of left popliteal vein: Secondary | ICD-10-CM | POA: Diagnosis not present

## 2019-01-03 DIAGNOSIS — Z7982 Long term (current) use of aspirin: Secondary | ICD-10-CM | POA: Diagnosis not present

## 2019-01-03 DIAGNOSIS — W19XXXA Unspecified fall, initial encounter: Secondary | ICD-10-CM | POA: Diagnosis not present

## 2019-01-03 DIAGNOSIS — R609 Edema, unspecified: Secondary | ICD-10-CM | POA: Diagnosis not present

## 2019-01-03 DIAGNOSIS — R202 Paresthesia of skin: Secondary | ICD-10-CM | POA: Diagnosis present

## 2019-01-03 DIAGNOSIS — S72002A Fracture of unspecified part of neck of left femur, initial encounter for closed fracture: Secondary | ICD-10-CM | POA: Insufficient documentation

## 2019-01-03 DIAGNOSIS — Z79899 Other long term (current) drug therapy: Secondary | ICD-10-CM | POA: Insufficient documentation

## 2019-01-03 DIAGNOSIS — E039 Hypothyroidism, unspecified: Secondary | ICD-10-CM | POA: Insufficient documentation

## 2019-01-03 DIAGNOSIS — I4819 Other persistent atrial fibrillation: Secondary | ICD-10-CM | POA: Insufficient documentation

## 2019-01-03 DIAGNOSIS — R911 Solitary pulmonary nodule: Secondary | ICD-10-CM | POA: Diagnosis not present

## 2019-01-03 DIAGNOSIS — I82409 Acute embolism and thrombosis of unspecified deep veins of unspecified lower extremity: Secondary | ICD-10-CM | POA: Diagnosis present

## 2019-01-03 DIAGNOSIS — S8012XA Contusion of left lower leg, initial encounter: Secondary | ICD-10-CM | POA: Diagnosis not present

## 2019-01-03 DIAGNOSIS — M7989 Other specified soft tissue disorders: Secondary | ICD-10-CM | POA: Diagnosis present

## 2019-01-03 DIAGNOSIS — M79662 Pain in left lower leg: Secondary | ICD-10-CM | POA: Diagnosis present

## 2019-01-03 DIAGNOSIS — R918 Other nonspecific abnormal finding of lung field: Secondary | ICD-10-CM | POA: Diagnosis not present

## 2019-01-03 HISTORY — DX: Acute embolism and thrombosis of unspecified deep veins of unspecified lower extremity: I82.409

## 2019-01-03 HISTORY — DX: Unspecified injury of unspecified hip, initial encounter: S79.919A

## 2019-01-03 LAB — POC OCCULT BLOOD, ED: Fecal Occult Bld: NEGATIVE

## 2019-01-03 LAB — CBC WITH DIFFERENTIAL/PLATELET
Abs Immature Granulocytes: 0.08 10*3/uL — ABNORMAL HIGH (ref 0.00–0.07)
Basophils Absolute: 0 10*3/uL (ref 0.0–0.1)
Basophils Relative: 1 %
Eosinophils Absolute: 0.1 10*3/uL (ref 0.0–0.5)
Eosinophils Relative: 1 %
HCT: 37.3 % — ABNORMAL LOW (ref 39.0–52.0)
Hemoglobin: 12.1 g/dL — ABNORMAL LOW (ref 13.0–17.0)
Immature Granulocytes: 1 %
Lymphocytes Relative: 18 %
Lymphs Abs: 1.1 10*3/uL (ref 0.7–4.0)
MCH: 33.4 pg (ref 26.0–34.0)
MCHC: 32.4 g/dL (ref 30.0–36.0)
MCV: 103 fL — ABNORMAL HIGH (ref 80.0–100.0)
Monocytes Absolute: 0.7 10*3/uL (ref 0.1–1.0)
Monocytes Relative: 12 %
Neutro Abs: 4.4 10*3/uL (ref 1.7–7.7)
Neutrophils Relative %: 67 %
Platelets: 257 10*3/uL (ref 150–400)
RBC: 3.62 MIL/uL — ABNORMAL LOW (ref 4.22–5.81)
RDW: 13.2 % (ref 11.5–15.5)
WBC: 6.4 10*3/uL (ref 4.0–10.5)
nRBC: 0 % (ref 0.0–0.2)

## 2019-01-03 LAB — BASIC METABOLIC PANEL
Anion gap: 7 (ref 5–15)
BUN: 13 mg/dL (ref 6–20)
CO2: 27 mmol/L (ref 22–32)
Calcium: 9 mg/dL (ref 8.9–10.3)
Chloride: 104 mmol/L (ref 98–111)
Creatinine, Ser: 0.78 mg/dL (ref 0.61–1.24)
GFR calc Af Amer: >60 mL/min (ref >60)
GFR calc non Af Amer: >60 mL/min (ref >60)
Glucose, Bld: 96 mg/dL (ref 70–99)
Potassium: 3.9 mmol/L (ref 3.5–5.1)
Sodium: 138 mmol/L (ref 135–145)

## 2019-01-03 MED ORDER — VITAMIN E 180 MG (400 UNIT) PO CAPS
400.0000 [IU] | ORAL_CAPSULE | Freq: Every day | ORAL | Status: DC
  Administered 2019-01-04 – 2019-01-05 (×2): 400 [IU] via ORAL
  Filled 2019-01-03 (×2): qty 1

## 2019-01-03 MED ORDER — ADULT MULTIVITAMIN W/MINERALS CH
1.0000 | ORAL_TABLET | Freq: Every day | ORAL | Status: DC
  Administered 2019-01-04 – 2019-01-05 (×2): 1 via ORAL
  Filled 2019-01-03 (×2): qty 1

## 2019-01-03 MED ORDER — METHOCARBAMOL 500 MG PO TABS
500.0000 mg | ORAL_TABLET | Freq: Three times a day (TID) | ORAL | Status: DC | PRN
  Administered 2019-01-03 – 2019-01-05 (×2): 500 mg via ORAL
  Filled 2019-01-03 (×2): qty 1

## 2019-01-03 MED ORDER — VITAMIN D 25 MCG (1000 UNIT) PO TABS
1000.0000 [IU] | ORAL_TABLET | Freq: Every day | ORAL | Status: DC
  Administered 2019-01-04 – 2019-01-05 (×2): 1000 [IU] via ORAL
  Filled 2019-01-03 (×2): qty 1

## 2019-01-03 MED ORDER — ONDANSETRON HCL 4 MG PO TABS: 4.0000 mg | ORAL_TABLET | Freq: Four times a day (QID) | ORAL | Status: DC | PRN

## 2019-01-03 MED ORDER — HEPARIN BOLUS VIA INFUSION
3000.0000 [IU] | Freq: Once | INTRAVENOUS | Status: AC
  Administered 2019-01-03: 3000 [IU] via INTRAVENOUS
  Filled 2019-01-03: qty 3000

## 2019-01-03 MED ORDER — ACETAMINOPHEN 325 MG PO TABS
650.0000 mg | ORAL_TABLET | Freq: Four times a day (QID) | ORAL | Status: DC | PRN
  Administered 2019-01-05: 650 mg via ORAL
  Filled 2019-01-03: qty 2

## 2019-01-03 MED ORDER — IOPAMIDOL (ISOVUE-370) INJECTION 76%
INTRAVENOUS | Status: AC
  Filled 2019-01-03: qty 100

## 2019-01-03 MED ORDER — ACETAMINOPHEN 650 MG RE SUPP: 650.0000 mg | Freq: Four times a day (QID) | RECTAL | Status: DC | PRN

## 2019-01-03 MED ORDER — SENNOSIDES-DOCUSATE SODIUM 8.6-50 MG PO TABS
2.0000 | ORAL_TABLET | Freq: Every day | ORAL | Status: DC
  Administered 2019-01-03 – 2019-01-04 (×2): 2 via ORAL
  Filled 2019-01-03 (×2): qty 2

## 2019-01-03 MED ORDER — VITAMIN C 500 MG PO TABS
1000.0000 mg | ORAL_TABLET | Freq: Every day | ORAL | Status: DC
  Administered 2019-01-04 – 2019-01-05 (×2): 1000 mg via ORAL
  Filled 2019-01-03 (×2): qty 2

## 2019-01-03 MED ORDER — B COMPLEX PO TABS: 1.0000 | ORAL_TABLET | Freq: Every day | ORAL | Status: DC

## 2019-01-03 MED ORDER — VITAMIN D 1000 UNITS PO TABS: 1000.0000 [IU] | ORAL_TABLET | Freq: Every day | ORAL | Status: DC

## 2019-01-03 MED ORDER — LEVOTHYROXINE SODIUM 25 MCG PO TABS
25.0000 ug | ORAL_TABLET | Freq: Every day | ORAL | Status: DC
  Administered 2019-01-04 – 2019-01-05 (×2): 25 ug via ORAL
  Filled 2019-01-03 (×2): qty 1

## 2019-01-03 MED ORDER — HEPARIN (PORCINE) 25000 UT/250ML-% IV SOLN
1400.0000 [IU]/h | INTRAVENOUS | Status: DC
  Administered 2019-01-03: 1400 [IU]/h via INTRAVENOUS
  Filled 2019-01-03: qty 250

## 2019-01-03 MED ORDER — HYDROCODONE-ACETAMINOPHEN 5-325 MG PO TABS
1.0000 | ORAL_TABLET | Freq: Four times a day (QID) | ORAL | Status: DC | PRN
  Administered 2019-01-03: 1 via ORAL
  Administered 2019-01-04 (×2): 2 via ORAL
  Administered 2019-01-04 – 2019-01-05 (×2): 1 via ORAL
  Filled 2019-01-03: qty 2
  Filled 2019-01-03 (×3): qty 1
  Filled 2019-01-03: qty 2

## 2019-01-03 MED ORDER — B COMPLEX-C PO TABS
1.0000 | ORAL_TABLET | Freq: Every day | ORAL | Status: DC
  Administered 2019-01-04 – 2019-01-05 (×2): 1 via ORAL
  Filled 2019-01-03 (×2): qty 1

## 2019-01-03 MED ORDER — DILTIAZEM HCL ER COATED BEADS 120 MG PO CP24
120.0000 mg | ORAL_CAPSULE | Freq: Every day | ORAL | Status: DC
  Administered 2019-01-04 – 2019-01-05 (×2): 120 mg via ORAL
  Filled 2019-01-03 (×2): qty 1

## 2019-01-03 MED ORDER — TAMSULOSIN HCL 0.4 MG PO CAPS
0.4000 mg | ORAL_CAPSULE | Freq: Every day | ORAL | Status: DC
  Administered 2019-01-03 – 2019-01-05 (×3): 0.4 mg via ORAL
  Filled 2019-01-03 (×3): qty 1

## 2019-01-03 MED ORDER — IOPAMIDOL (ISOVUE-370) INJECTION 76%
100.0000 mL | Freq: Once | INTRAVENOUS | Status: AC | PRN
  Administered 2019-01-03: 100 mL via INTRAVENOUS

## 2019-01-03 NOTE — Progress Notes (Signed)
Pt arrived to the unit via stretcher with belongings to the side. Pt alert and verbally responsive; pt oriented to the unit and room; Left hip incision has mepilex dsg intact; fall/safety precaution and prevention education completed; VSS; bed alarm on; urinal given to pt. No orders yet for pt and MD paged and notified for orders. Will closely monitor pt. Joshua Heady RN   01/03/19 1755  Vitals  Temp 97.7 F (36.5 C)  Temp Source Oral  BP 118/75  MAP (mmHg) 89  BP Location Left Arm  BP Method Automatic  Patient Position (if appropriate) Lying  Pulse Rate 82  Pulse Rate Source Dinamap  Resp 18  Oxygen Therapy  SpO2 98 %  O2 Device Room Air

## 2019-01-03 NOTE — Progress Notes (Signed)
Patient received for observation status to room 5C-12.  Introduced to staff and to unit routine.  Safety protocols explained and bed kept in low position with call bell within reach.  Bed alarm in place.  Comprehension ascertained via teach-back technique.  Heparin gtt infusing.  LLE has weak dorsalis pedal pulse compared to R foot.  Family have left for the evening, but patient appears comfortable.

## 2019-01-03 NOTE — H&P (Signed)
History and Physical   Joshua Moyer OJJ:[id number] DOB: [birth year] DOA: 01/03/2019  Referring MD/NP/PA: Rodell Perna, PA, EDP PCP: Hulan Fess, MD Outpatient Specialists: Orthopedics, Dr. Stann Mainland  Patient coming from: [facility]  Chief Complaint: left lower leg swelling and pain  HPI: Joshua Moyer is a 58 y.o. male with a history of developmental delay, persistent AFib not on anticoagulation, GERD, and hypothyroidism who presented from SNF with left leg pain. He was recently admitted after a mechanical fall found to have intertrochanteric left hip fracture and underwent IM nail by Dr. Stann Mainland 1/5. He was subsequently discharged to SNF for rehabilitation on aspirin 81mg  po BID for DVT ppx x6 weeks. Hgb at discharge was 13. He now reports 2 days of gradual onset, worsening, severe, throbbing left lower leg pain with intermittent associated tingling. Better with meds for left hip pain, not better with elevation. No associated chest pain, dyspnea, palpitations.    ED Course: Hgb 12.1, otherwise VSS and labs largely unremarkable. Lower extremity ultrasound demonstrated acute left leg DVT and CTA chest did not show PE. Because his sister reported him having red blood on a toilet paper when wiping after BM 2 days ago, EDP concerned for GI bleeding that would be precipitated by initiation of anticoagulation. Hospitalists asked to evaluate.   Review of Systems: Chronically has intermittent sweats at night for years and recently had constipation, though LBM was normal today. Denies fever, chills, weight loss, changes in vision or hearing, headache, cough, sore throat, abdominal pain, nausea, vomiting, change in bladder habits, myalgias, arthralgias, and rash, and per HPI. All others reviewed and are negative.   Past Medical History:  Diagnosis Date  . Atrial fibrillation ([place])   . Atrial flutter ([place])   . Broken neck ([place])    In 1996  . Cognitive developmental delay   . Dysrhythmia     . GERD (gastroesophageal reflux disease)   . Hypothyroidism    Past Surgical History:  Procedure Laterality Date  . CARDIAC ELECTROPHYSIOLOGY STUDY AND ABLATION    . FEMUR IM NAIL Left 12/27/2018  . INGUINAL HERNIA REPAIR     right  . INTRAMEDULLARY (IM) NAIL INTERTROCHANTERIC Left 12/27/2018   Procedure: INTRAMEDULLARY (IM) NAIL, LEFT HIP;  Surgeon: Nicholes Stairs, MD;  Location: [facility];  Service: Orthopedics;  Laterality: Left;  . IR GENERIC HISTORICAL  09/10/2016   IR RADIOLOGIST EVAL & MGMT 09/10/2016 Corrie Mckusick, DO [facility] INTERV RAD  . LAPAROSCOPIC APPENDECTOMY N/A 11/04/2016   Procedure: APPENDECTOMY LAPAROSCOPIC;  Surgeon: Autumn Messing III, MD;  Location: [facility];  Service: General;  Laterality: N/A;  . NECK SURGERY     broke neck in 1996   - Never smoker, drinker or illicit drugs. Has developmental delay and is under close care by his sister who is a Immunologist and his HCPOA.   reports that he has never smoked. He has never used smokeless tobacco. He reports that he does not drink alcohol or use drugs. Allergies  Allergen Reactions  . Chlorhexidine Gluconate [Chlorhexidine] Itching  . Statins Other (See Comments)    Leg Pain    Family History  Problem Relation Age of Onset  . Diabetes Mother   . Breast cancer Mother    - Family history otherwise reviewed and not pertinent. No blood clots Prior to Admission medications   Medication Sig Start Date End Date Taking? Authorizing Provider  acetaminophen (TYLENOL) 325 MG tablet Take 650 mg by mouth every 4 (four) hours as needed for fever (  pain). Do not exceed 4000 mg of acetaminophen in 24 hours - note amount of apap in [place].   Yes [provider]  aspirin 81 MG chewable tablet Chew 1 tablet (81 mg total) by mouth 2 (two) times daily with a meal. For 6 weeks and then after that only once daily with breakfast Patient taking differently: Chew 81 mg by mouth See admin instructions. Take one tablet (81 mg) by mouth two time  daily with meals for 6 weeks (start date 12/30/18), then take one tablet (81 mg) daily with breakfast 12/29/18  Yes Emokpae, Courage, MD  b complex vitamins tablet Take 1 tablet by mouth daily.   Yes [provider]  cholecalciferol (VITAMIN D) 1000 units tablet Take 1,000 Units by mouth daily.   Yes [provider]  diltiazem (CARDIZEM CD) 120 MG 24 hr capsule Take 120 mg by mouth daily.   Yes [provider]  HYDROcodone-acetaminophen (NORCO/VICODIN) 5-325 MG tablet Take 1-2 tablets by mouth every 6 (six) hours as needed for moderate pain. 12/29/18  Yes Emokpae, Courage, MD  levothyroxine (SYNTHROID, LEVOTHROID) 25 MCG tablet Take 25 mcg by mouth daily at 6 (six) AM.  07/14/16  Yes [provider]  methocarbamol (ROBAXIN) 500 MG tablet Take 1 tablet (500 mg total) by mouth 3 (three) times daily. 12/29/18  Yes Emokpae, Courage, MD  Multiple Vitamin (MULTIVITAMIN WITH MINERALS) TABS tablet Take 1 tablet by mouth daily. 12/30/18  Yes Emokpae, Courage, MD  ondansetron (ZOFRAN) 4 MG tablet Take 1 tablet (4 mg total) by mouth every 6 (six) hours as needed for nausea. 12/29/18  Yes Emokpae, Courage, MD  senna-docusate (SENOKOT-S) 8.6-50 MG tablet Take 2 tablets by mouth at bedtime. 12/29/18  Yes Emokpae, Courage, MD  tamsulosin (FLOMAX) 0.4 MG CAPS capsule Take 1 capsule (0.4 mg total) by mouth daily after supper. 12/29/18  Yes Emokpae, Courage, MD  vitamin C (ASCORBIC ACID) 500 MG tablet Take 1,000 mg by mouth daily.    Yes [provider]  vitamin E 400 UNIT capsule Take 400 Units by mouth daily.   Yes [provider]    Physical Exam: Vitals:   01/03/19 1324 01/03/19 1330 01/03/19 1345 01/03/19 1755  BP: 94/64 107/76 101/71 118/75  Pulse: (!) 102 98 96 82  Resp: 20 (!) 25 16 18   Temp: 98.3 F (36.8 C)   97.7 F (36.5 C)  TempSrc: Oral   Oral  SpO2: 97% 98% 95% 98%  Weight: 87.5 kg     Height: 6\' 5"  (1.956 m)      Constitutional: 58 y.o. male in no  distress, calm demeanor Eyes: Lids and conjunctivae normal, PERRL, lateral strabismus ENMT: Mucous membranes are moist. Posterior pharynx clear of any exudate or lesions. Fair dentition.  Neck: normal, supple, no masses, no thyromegaly Respiratory: Non-labored breathing room air without accessory muscle use. Clear breath sounds to auscultation bilaterally Cardiovascular: Irreg irreg, no murmurs, rubs, or gallops. No carotid bruits. No JVD. Abdomen: Normoactive bowel sounds. No tenderness, non-distended, and no masses palpated. No hepatosplenomegaly. GU: No indwelling catheter Rectal: Performed by EDP, deferred due to pt's placement in hallway bed. Musculoskeletal: LLE with lateral thigh with c/d/i dressings appropriately mildly tender to palpable with ecchymoses settling dependently/posteriorly. Left lower leg diffusely edematous with plethora, compartment soft, +homan's. Sensation intact throughout. DP and PT pulses 2+ Skin: Warm, dry. As above, otherwise no rashes, wounds, or ulcers. Neurologic: CN II-XII grossly intact. Gait not assessed. Speech normal. No focal deficits in motor  strength or sensation in all extremities.  Psychiatric: Alert and oriented x3.   Labs on Admission: I have personally reviewed following labs and imaging studies  CBC: Recent Labs  Lab 12/28/18 0459 01/03/19 1339  WBC 11.3* 6.4  NEUTROABS  --  4.4  HGB 13.0 12.1*  HCT 37.8* 37.3*  MCV 98.2 103.0*  PLT 201 578   Basic Metabolic Panel: Recent Labs  Lab 12/28/18 0459 01/03/19 1339  NA 135 138  K 3.9 3.9  CL 105 104  CO2 23 27  GLUCOSE 125* 96  BUN 10 13  CREATININE 0.63 0.78  CALCIUM 8.7* 9.0   GFR: Estimated Creatinine Clearance: 126.1 mL/min (by C-G formula based on SCr of 0.78 mg/dL). Liver Function Tests: No results for input(s): AST, ALT, ALKPHOS, BILITOT, PROT, ALBUMIN in the last 168 hours. No results for input(s): LIPASE, AMYLASE in the last 168 hours. No results for input(s): AMMONIA  in the last 168 hours. Coagulation Profile: No results for input(s): INR, PROTIME in the last 168 hours. Cardiac Enzymes: No results for input(s): CKTOTAL, CKMB, CKMBINDEX, TROPONINI in the last 168 hours. BNP (last 3 results) No results for input(s): PROBNP in the last 8760 hours. HbA1C: No results for input(s): HGBA1C in the last 72 hours. CBG: No results for input(s): GLUCAP in the last 168 hours. Lipid Profile: No results for input(s): CHOL, HDL, LDLCALC, TRIG, CHOLHDL, LDLDIRECT in the last 72 hours. Thyroid Function Tests: No results for input(s): TSH, T4TOTAL, FREET4, T3FREE, THYROIDAB in the last 72 hours. Anemia Panel: No results for input(s): VITAMINB12, FOLATE, FERRITIN, TIBC, IRON, RETICCTPCT in the last 72 hours. Urine analysis:    Component Value Date/Time   COLORURINE ORANGE (A) 08/21/2016 2250   APPEARANCEUR TURBID (A) 08/21/2016 2250   LABSPEC 1.032 (H) 08/21/2016 2250   PHURINE 5.5 08/21/2016 2250   GLUCOSEU NEGATIVE 08/21/2016 2250   HGBUR TRACE (A) 08/21/2016 2250   BILIRUBINUR MODERATE (A) 08/21/2016 2250   KETONESUR >80 (A) 08/21/2016 2250   PROTEINUR 30 (A) 08/21/2016 2250   NITRITE POSITIVE (A) 08/21/2016 2250   LEUKOCYTESUR SMALL (A) 08/21/2016 2250    Recent Results (from the past 240 hour(s))  MRSA PCR Screening     Status: None   Collection Time: 12/27/18  9:13 AM  Result Value Ref Range Status   MRSA by PCR NEGATIVE NEGATIVE Final    Comment:        The GeneXpert MRSA Assay (FDA approved for NASAL specimens only), is one component of a comprehensive MRSA colonization surveillance program. It is not intended to diagnose MRSA infection nor to guide or monitor treatment for MRSA infections. Performed at [facility], [place] [address], [place], [place] [address]      Radiological Exams on Admission: Ct Angio Chest Pe W And/or Wo Contrast  Result Date: 01/03/2019 CLINICAL DATA:  Left leg swelling pain and bruising. EXAM: CT  ANGIOGRAPHY CHEST WITH CONTRAST TECHNIQUE: Multidetector CT imaging of the chest was performed using the standard protocol during bolus administration of intravenous contrast. Multiplanar CT image reconstructions and MIPs were obtained to evaluate the vascular anatomy. CONTRAST:  169mL ISOVUE-370 IOPAMIDOL (ISOVUE-370) INJECTION 76% COMPARISON:  None. FINDINGS: Cardiovascular: The heart size is normal. No substantial pericardial effusion. Coronary artery calcification is evident. No thoracic aortic aneurysm. No filling defect in the opacified pulmonary arteries to suggest the presence of an acute pulmonary embolus. Mediastinum/Nodes: 1.5 cm short axis precarinal lymph node is associated with other mediastinal lymphadenopathy. 1.8 cm short axis  AP window lymph node evident. 1.9 cm short axis lymph node identified in the right hilum. Prominent lymph nodes are seen in the left hilum with a posterior left hilar node measuring 1.4 cm short axis. Punctate calcification noted in 1 of the AP window lymph nodes. The esophagus has normal imaging features. There is no axillary lymphadenopathy. Lungs/Pleura: The central tracheobronchial airways are patent. 5 mm right middle lobe nodule identified on 91/6. 6 mm left lower lobe nodule visible on 84/6. Dependent atelectasis noted both lower lobes. Upper Abdomen: Unremarkable. Musculoskeletal: No worrisome lytic or sclerotic osseous abnormality. Review of the MIP images confirms the above findings. IMPRESSION: 1. No CT evidence for acute pulmonary embolus. 2. Mediastinal and bilateral hilar lymphadenopathy, nonspecific. Infectious/inflammatory etiology (including sarcoidosis) is possible and there is a punctate calcification in an AP window lymph node. Metastatic disease and lymphoma are also within the differential consideration at this point. 3. Bilateral pulmonary nodules measuring up to 6 mm. Attention on follow-up recommended. If lymphadenopathy is found to be metastatic,  these nodules will be more concerning for metastatic pulmonary parenchymal involvement. Electronically Signed   By: Misty Stanley M.D.   On: 01/03/2019 16:15   Vas [place] Lower Extremity Venous (dvt) (only Mc & Wl 7a-7p)  Result Date: 01/03/2019  Lower Venous Study Indications: Edema.  Performing Technologist: June Leap RDMS, RVT  Examination Guidelines: A complete evaluation includes B-mode imaging, spectral Doppler, color Doppler, and power Doppler as needed of all accessible portions of each vessel. Bilateral testing is considered an integral part of a complete examination. Limited examinations for reoccurring indications may be performed as noted.  Right Venous Findings: +---+---------------+---------+-----------+----------+-------+    CompressibilityPhasicitySpontaneityPropertiesSummary +---+---------------+---------+-----------+----------+-------+ CFVFull           Yes      Yes                          +---+---------------+---------+-----------+----------+-------+  Left Venous Findings: +---------+---------------+---------+-----------+----------+-------+          CompressibilityPhasicitySpontaneityPropertiesSummary +---------+---------------+---------+-----------+----------+-------+ CFV      Full           Yes      Yes                          +---------+---------------+---------+-----------+----------+-------+ SFJ      Full                                                 +---------+---------------+---------+-----------+----------+-------+ FV Prox  Full                                                 +---------+---------------+---------+-----------+----------+-------+ FV Mid   Full                                                 +---------+---------------+---------+-----------+----------+-------+ FV DistalFull                                                 +---------+---------------+---------+-----------+----------+-------+  PFV      Full                                                  +---------+---------------+---------+-----------+----------+-------+ POP      Partial        Yes      Yes                  Acute   +---------+---------------+---------+-----------+----------+-------+ PTV      None                                         Acute   +---------+---------------+---------+-----------+----------+-------+ PERO     Full                                                 +---------+---------------+---------+-----------+----------+-------+ Gastroc  None                                         Acute   +---------+---------------+---------+-----------+----------+-------+ Mobile DVT left popliteal    Summary: Right: No evidence of common femoral vein obstruction. Left: Findings consistent with acute deep vein thrombosis involving the left popliteal vein, and left posterior tibial vein. No cystic structure found in the popliteal fossa.  *See table(s) above for measurements and observations. Electronically signed by Monica Martinez MD on 01/03/2019 at 2:45:20 PM.    Final     Assessment/Plan Active Problems:   Acute deep vein thrombosis (DVT) ([place])   Acute left posterior tibial and popliteal DVT: In setting of recent ipsilateral orthopedic surgery despite taking ASA 81mg  BID. No PE. - Start heparin. D/w POA who feels eliquis would be suitable agent to use once transitioning to po, plan this tomorrow unless gross GI bleeding develops - Monitor levels per protocol and CBC in AM  Mild acute blood loss anemia and RBPR 2 days ago: Suspect an element of anemia from ecchymosis developing postoperatively. - Monitor for GI bleeding. FOBT negative now.  - Pt not due for routine colonoscopy (last was 2013 with only 1 sessile polyp, tubular adenoma on pathology)  Mediastinal and bilateral hilar lymphadenopathy, bilateral pulmonary nodules (<59mm): Incidental finding on CTA chest.  - Discussed at length with pt and POA. He has had no  infectious or pulmonary symptoms and no constitutional symptoms of malignancy or autoimmune/inflammatory conditions. I recommended repeat CT chest in about 3 months for this never smoker prior to moving forward with bronch, etc.  Hypothyroidism:  - Continue synthroid  AFib: Has declined anticoagulation in the past due to personal preference.  - Continue diltiazem, rate is controlled. No indication for telemetry.  Left hip fracture s/p IM nail:  - Continue PT. Pt will need placement at alternative SNF. CSW has been contacted. - Follow up with Dr. Stann Mainland as scheduled in couple weeks.  DVT prophylaxis: IV heparin  Code Status: Full  Family Communication: Sister, HCPOA  Disposition Plan: SNF Consults called: None  Admission status: Observation, anticipate discharge within 24 hours to alternative SNF.    Meredith Leeds  Bonner Puna, MD [facility] [email or web address] Password [email or web address] 01/03/2019, 6:09 PM

## 2019-01-03 NOTE — Progress Notes (Signed)
ANTICOAGULATION CONSULT NOTE - Initial Consult  Pharmacy Consult for heparin Indication: DVT  Allergies  Allergen Reactions  . Chlorhexidine Gluconate [Chlorhexidine] Itching  . Statins Other (See Comments)    Leg Pain     Patient Measurements: Height: 6\' 5"  (195.6 cm) Weight: 193 lb (87.5 kg) IBW/kg (Calculated) : 89.1 Heparin Dosing Weight: 87.5kg  Vital Signs: Temp: 97.7 F (36.5 C) (01/12 1755) Temp Source: Oral (01/12 1755) BP: 118/75 (01/12 1755) Pulse Rate: 82 (01/12 1755)  Labs: Recent Labs    01/03/19 1339  HGB 12.1*  HCT 37.3*  PLT 257  CREATININE 0.78    Estimated Creatinine Clearance: 126.1 mL/min (by C-G formula based on SCr of 0.78 mg/dL).   Medical History: Past Medical History:  Diagnosis Date  . Atrial fibrillation ([place])   . Atrial flutter ([place])   . Broken neck ([place])    In 1996  . Cognitive developmental delay   . Dysrhythmia   . GERD (gastroesophageal reflux disease)   . Hypothyroidism     Medications:  Infusions:  . heparin      Assessment: 31 yom presented with an acute DVT to start IV heparin. He is not on anticoagulation PTA despite a history of afib. Baseline Hgb is slightly low and platelets are WNL. He recently had surgery on 1/5.   Goal of Therapy:  Heparin level 0.3-0.7 units/ml Monitor platelets by anticoagulation protocol: Yes   Plan:  Heparin bolus 3000 units IV x 1 - conservative bolus d/t recent surgery and mention of recent bloody stools (FOB negative currently) Heparin gtt 1400 units/hr Check a 6 hr heparin level Daily heparin level and CBC  Seniya Stoffers, Rande Lawman 01/03/2019,6:21 PM

## 2019-01-03 NOTE — ED Provider Notes (Signed)
[facility] EMERGENCY DEPARTMENT Provider Note   CSN: [id number] Arrival date & time: 01/03/19  1317      History   Chief Complaint Chief Complaint  Patient presents with  . Leg Pain    HPI Joshua Moyer is a 58 y.o. male with history of persistent atrial fibrillation, hypothyroidism, GERD presenting for evaluation of acute onset, progressively worsening left lower extremity pain and swelling for 2 days.  He is coming from [place] who had not noted the swelling until today but he reports a constant aching throbbing pain to the extremity which worsens with movement.  Also notes intermittent numbness and tingling to the extremity and toes.  Denies fevers, chills, or shortness of breath.  He does note generalized nonspecific pain to the chest but reports he believes this is musculoskeletal in nature after his rehab. No nausea or vomiting.  He is currently anticoagulated on baby aspirin.  He is 7 days status post repair of intertrochanteric hip fracture secondary to fall.   The history is provided by the patient.    Past Medical History:  Diagnosis Date  . Atrial fibrillation ([place])   . Atrial flutter ([place])   . Broken neck ([place])    In 1996  . Cognitive developmental delay   . Dysrhythmia   . GERD (gastroesophageal reflux disease)   . Hypothyroidism     Patient Active Problem List   Diagnosis Date Noted  . Acute deep vein thrombosis (DVT) ([place]) 01/03/2019  . Closed left hip fracture ([place]) 12/26/2018  . Appendicitis 11/04/2016  . Atrial fibrillation/Flutter - chronic, rate-controlled 08/23/2016  . Cognitive developmental delay   . Acute appendicitis with peritoneal abscess 08/22/2016  . Atrial flutter ([place]) 05/15/2010    Past Surgical History:  Procedure Laterality Date  . CARDIAC ELECTROPHYSIOLOGY STUDY AND ABLATION    . FEMUR IM NAIL Left 12/27/2018  . INGUINAL HERNIA REPAIR     right  . INTRAMEDULLARY (IM) NAIL INTERTROCHANTERIC Left  12/27/2018   Procedure: INTRAMEDULLARY (IM) NAIL, LEFT HIP;  Surgeon: Nicholes Stairs, MD;  Location: [facility];  Service: Orthopedics;  Laterality: Left;  . IR GENERIC HISTORICAL  09/10/2016   IR RADIOLOGIST EVAL & MGMT 09/10/2016 Corrie Mckusick, DO [facility] INTERV RAD  . LAPAROSCOPIC APPENDECTOMY N/A 11/04/2016   Procedure: APPENDECTOMY LAPAROSCOPIC;  Surgeon: Autumn Messing III, MD;  Location: [facility];  Service: General;  Laterality: N/A;  . NECK SURGERY     broke neck in [place] Medications    Prior to Admission medications   Medication Sig Start Date End Date Taking? Authorizing Provider  Ascorbic Acid (VITAMIN C) 1000 MG tablet Take 1,000 mg by mouth daily.    [provider]  aspirin 81 MG chewable tablet Chew 1 tablet (81 mg total) by mouth 2 (two) times daily with a meal. For 6 weeks and then after that only once daily with breakfast 12/29/18   Roxan Hockey, MD  b complex vitamins tablet Take 1 tablet by mouth daily.    [provider]  cholecalciferol (VITAMIN D) 1000 units tablet Take 1,000 Units by mouth daily.    [provider]  HYDROcodone-acetaminophen (NORCO/VICODIN) 5-325 MG tablet Take 1-2 tablets by mouth every 6 (six) hours as needed for moderate pain. 12/29/18   Roxan Hockey, MD  levothyroxine (SYNTHROID, LEVOTHROID) 25 MCG tablet Take 25 mcg by mouth daily before breakfast.  07/14/16   [provider]  methocarbamol (ROBAXIN) 500 MG  tablet Take 1 tablet (500 mg total) by mouth 3 (three) times daily. 12/29/18   Roxan Hockey, MD  Multiple Vitamin (MULTIVITAMIN WITH MINERALS) TABS tablet Take 1 tablet by mouth daily. 12/30/18   Roxan Hockey, MD  ondansetron (ZOFRAN) 4 MG tablet Take 1 tablet (4 mg total) by mouth every 6 (six) hours as needed for nausea. 12/29/18   Roxan Hockey, MD  senna-docusate (SENOKOT-S) 8.6-50 MG tablet Take 2 tablets by mouth at bedtime. 12/29/18   Roxan Hockey, MD  tamsulosin (FLOMAX) 0.4 MG CAPS  capsule Take 1 capsule (0.4 mg total) by mouth daily after supper. 12/29/18   Roxan Hockey, MD  vitamin E 400 UNIT capsule Take 400 Units by mouth daily.    [provider]    Family History Family History  Problem Relation Age of Onset  . Diabetes Mother   . Breast cancer Mother     Social History Social History   Tobacco Use  . Smoking status: Never Smoker  . Smokeless tobacco: Never Used  Substance Use Topics  . Alcohol use: No  . Drug use: No     Allergies   Chlorhexidine gluconate [chlorhexidine] and Statins   Review of Systems Review of Systems  Constitutional: Negative for chills and fever.  Respiratory: Negative for shortness of breath.   Cardiovascular: Positive for leg swelling. Negative for chest pain.  Musculoskeletal: Positive for arthralgias and myalgias.  Neurological: Positive for numbness.  All other systems reviewed and are negative.    Physical Exam Updated Vital Signs BP 101/71   Pulse 96   Temp 98.3 F (36.8 C) (Oral)   Resp 16   Ht 6\' 5"  (1.956 m)   Wt 87.5 kg   SpO2 95%   BMI 22.89 kg/m   Physical Exam Vitals signs and nursing note reviewed.  Constitutional:      General: He is not in acute distress.    Appearance: He is well-developed.  HENT:     Head: Normocephalic and atraumatic.  Eyes:     General:        Right eye: No discharge.        Left eye: No discharge.     Conjunctiva/sclera: Conjunctivae normal.  Neck:     Vascular: No JVD.     Trachea: No tracheal deviation.  Cardiovascular:     Rate and Rhythm: Rhythm irregular.     Comments: Irregularly irregular rhythm.  2+ radial and DP/PT pulses bilaterally Pulmonary:     Effort: Pulmonary effort is normal.     Breath sounds: Normal breath sounds.  Abdominal:     General: Abdomen is flat. There is no distension.     Tenderness: There is no abdominal tenderness. There is no guarding or rebound.  Musculoskeletal:     Comments: Obvious swelling of the left  lower extremity as compared to the right.  Homans sign present on the left.  No pitting edema of the lower extremities.  Large areas of ecchymosis to the left lower extremity in various stages of healing.  3/5 strength of left hip flexor, otherwise 5/5 strength of BLE major muscle groups.  Skin:    General: Skin is warm and dry.     Findings: No erythema.     Comments: 2 well-healing surgical incisions to the lateral aspect of the left thigh, staples in place.  No erythema, induration, or abnormal drainage.  Neurological:     Mental Status: He is alert.  Psychiatric:  Behavior: Behavior normal.      ED Treatments / Results  Labs (all labs ordered are listed, but only abnormal results are displayed) Labs Reviewed  CBC WITH DIFFERENTIAL/PLATELET - Abnormal; Notable for the following components:      Result Value   RBC 3.62 (*)    Hemoglobin 12.1 (*)    HCT 37.3 (*)    MCV 103.0 (*)    Abs Immature Granulocytes 0.08 (*)    All other components within normal limits  BASIC METABOLIC PANEL  POC OCCULT BLOOD, ED    EKG None  Radiology Ct Angio Chest Pe W And/or Wo Contrast  Result Date: 01/03/2019 CLINICAL DATA:  Left leg swelling pain and bruising. EXAM: CT ANGIOGRAPHY CHEST WITH CONTRAST TECHNIQUE: Multidetector CT imaging of the chest was performed using the standard protocol during bolus administration of intravenous contrast. Multiplanar CT image reconstructions and MIPs were obtained to evaluate the vascular anatomy. CONTRAST:  138mL ISOVUE-370 IOPAMIDOL (ISOVUE-370) INJECTION 76% COMPARISON:  None. FINDINGS: Cardiovascular: The heart size is normal. No substantial pericardial effusion. Coronary artery calcification is evident. No thoracic aortic aneurysm. No filling defect in the opacified pulmonary arteries to suggest the presence of an acute pulmonary embolus. Mediastinum/Nodes: 1.5 cm short axis precarinal lymph node is associated with other mediastinal lymphadenopathy.  1.8 cm short axis AP window lymph node evident. 1.9 cm short axis lymph node identified in the right hilum. Prominent lymph nodes are seen in the left hilum with a posterior left hilar node measuring 1.4 cm short axis. Punctate calcification noted in 1 of the AP window lymph nodes. The esophagus has normal imaging features. There is no axillary lymphadenopathy. Lungs/Pleura: The central tracheobronchial airways are patent. 5 mm right middle lobe nodule identified on 91/6. 6 mm left lower lobe nodule visible on 84/6. Dependent atelectasis noted both lower lobes. Upper Abdomen: Unremarkable. Musculoskeletal: No worrisome lytic or sclerotic osseous abnormality. Review of the MIP images confirms the above findings. IMPRESSION: 1. No CT evidence for acute pulmonary embolus. 2. Mediastinal and bilateral hilar lymphadenopathy, nonspecific. Infectious/inflammatory etiology (including sarcoidosis) is possible and there is a punctate calcification in an AP window lymph node. Metastatic disease and lymphoma are also within the differential consideration at this point. 3. Bilateral pulmonary nodules measuring up to 6 mm. Attention on follow-up recommended. If lymphadenopathy is found to be metastatic, these nodules will be more concerning for metastatic pulmonary parenchymal involvement. Electronically Signed   By: Misty Stanley M.D.   On: 01/03/2019 16:15   Vas [place] Lower Extremity Venous (dvt) (only Mc & Wl 7a-7p)  Result Date: 01/03/2019  Lower Venous Study Indications: Edema.  Performing Technologist: June Leap RDMS, RVT  Examination Guidelines: A complete evaluation includes B-mode imaging, spectral Doppler, color Doppler, and power Doppler as needed of all accessible portions of each vessel. Bilateral testing is considered an integral part of a complete examination. Limited examinations for reoccurring indications may be performed as noted.  Right Venous Findings:  +---+---------------+---------+-----------+----------+-------+    CompressibilityPhasicitySpontaneityPropertiesSummary +---+---------------+---------+-----------+----------+-------+ CFVFull           Yes      Yes                          +---+---------------+---------+-----------+----------+-------+  Left Venous Findings: +---------+---------------+---------+-----------+----------+-------+          CompressibilityPhasicitySpontaneityPropertiesSummary +---------+---------------+---------+-----------+----------+-------+ CFV      Full           Yes      Yes                          +---------+---------------+---------+-----------+----------+-------+  SFJ      Full                                                 +---------+---------------+---------+-----------+----------+-------+ FV Prox  Full                                                 +---------+---------------+---------+-----------+----------+-------+ FV Mid   Full                                                 +---------+---------------+---------+-----------+----------+-------+ FV DistalFull                                                 +---------+---------------+---------+-----------+----------+-------+ PFV      Full                                                 +---------+---------------+---------+-----------+----------+-------+ POP      Partial        Yes      Yes                  Acute   +---------+---------------+---------+-----------+----------+-------+ PTV      None                                         Acute   +---------+---------------+---------+-----------+----------+-------+ PERO     Full                                                 +---------+---------------+---------+-----------+----------+-------+ Gastroc  None                                         Acute   +---------+---------------+---------+-----------+----------+-------+ Mobile DVT left  popliteal    Summary: Right: No evidence of common femoral vein obstruction. Left: Findings consistent with acute deep vein thrombosis involving the left popliteal vein, and left posterior tibial vein. No cystic structure found in the popliteal fossa.  *See table(s) above for measurements and observations. Electronically signed by Monica Martinez MD on 01/03/2019 at 2:45:20 PM.    Final     Procedures Procedures (including critical care time)  Medications Ordered in ED Medications  iopamidol (ISOVUE-370) 76 % injection 100 mL (100 mLs Intravenous Contrast Given 01/03/19 1531)     Initial Impression / Assessment and Plan / ED Course  I have reviewed the triage vital signs and the nursing notes.  Pertinent labs & imaging results that were available during my care of  the patient were reviewed by me and considered in my medical decision making (see chart for details).   Patient presenting for evaluation of acute onset left lower extremity edema.  He is 7 days status post repair of left intertrochanteric fracture.He is afebrile, initially mildly tachycardic but vital signs otherwise stable.  He is neurovascularly intact.  Obvious swelling to the left lower extremity.  No signs of secondary skin infection.  DVT study positive for acute DVT involving the left popliteal vein and left posterior tibial vein.  CTA shows no evidence of PE however he does have mediastinal and bilateral hilar lymphadenopathy which could suggest sarcoidosis versus lymphoma or metastatic disease.  He also has bilateral pulmonary nodules.  Labs reviewed by me show anemia with 2 g drop in hemoglobin from lab work obtained 12/26/2018. His sister reports history of bloody stools a few days ago but not today.  He is not currently anticoagulated other than a baby aspirin for his persistent A. fib and has not been seen by a cardiologist in several years.  His A. fib is managed by his PCP.  His sister (who is also his power of attorney) is  unsure if he has any history of head bleed and is unsure why he is not currently anticoagulated.  She is expressing concerns with the care he is receiving at his facility.  He appears well-groomed and in no apparent distress.  Point-of-care Hemoccult was obtained by Dr. Billy Fischer with a chaperone present and she reports no evidence of frank rectal bleeding on examination.  Concern that with recent history of bloody stools he may not be an ideal candidate for anticoagulation on Xarelto for his DVT and may be better suited for heparin presently and may require serial hemoglobins to rule out acute bleed.  Spoke with Dr. Bonner Puna with tried hospitalist service who agrees to assume care of patient and bring him into the hospital for further evaluation and management.  Patient seen and evaluated by Dr. Billy Fischer who agrees with assessment and plan at this time.  Final Clinical Impressions(s) / ED Diagnoses   Final diagnoses:  Acute deep vein thrombosis (DVT) of popliteal vein of left lower extremity [facility])  Pulmonary nodules    ED Discharge Orders    None       Renita Papa, PA-C 01/03/19 1747    Gareth Morgan, MD 01/04/19 2231

## 2019-01-03 NOTE — ED Triage Notes (Signed)
Pt arrives via EMS from [facility] with reports of left leg swelling, pain and bruising. Pt was here last week for a fall and had hip surgery.

## 2019-01-03 NOTE — Progress Notes (Addendum)
CSW received request from [facility] to meet with patient regarding concerns with the facility patient was staying at. CSW listened to their concerns with communication and concerns regarding patient safety. CSW spoke with them that at the ED they typically would not place as it takes time to find a new facility. CSW encouraged POA to call facility. CSW spoke with Santiago Glad with admissions at the facility and confirmed for the [facility] the patient could return and they could work on transferring the patient Monday. CSW noted the patient was concerned and did not want the patient to return to the facility at all. CSW noted POA wanted to speak with the MD regarding results. CSW informed attending of this request and discharge conversation. CSW will continue to follow if discharge support is needed.  Lamonte Richer, LCSW, [place] Worker II [phone]

## 2019-01-03 NOTE — Progress Notes (Signed)
LLE venous duplex       has been completed. Preliminary results can be found under CV proc through chart review. June Leap, BS, RDMS, RVT   Gave positive results to Joycelyn Schmid, [place]

## 2019-01-04 ENCOUNTER — Encounter (HOSPITAL_COMMUNITY): Payer: Self-pay

## 2019-01-04 ENCOUNTER — Other Ambulatory Visit: Payer: Self-pay

## 2019-01-04 DIAGNOSIS — I82432 Acute embolism and thrombosis of left popliteal vein: Secondary | ICD-10-CM | POA: Diagnosis not present

## 2019-01-04 DIAGNOSIS — I82409 Acute embolism and thrombosis of unspecified deep veins of unspecified lower extremity: Secondary | ICD-10-CM

## 2019-01-04 HISTORY — DX: Acute embolism and thrombosis of unspecified deep veins of unspecified lower extremity: I82.409

## 2019-01-04 LAB — CBC
HEMATOCRIT: 35.1 % — AB (ref 39.0–52.0)
Hemoglobin: 11.8 g/dL — ABNORMAL LOW (ref 13.0–17.0)
MCH: 34.3 pg — ABNORMAL HIGH (ref 26.0–34.0)
MCHC: 33.6 g/dL (ref 30.0–36.0)
MCV: 102 fL — ABNORMAL HIGH (ref 80.0–100.0)
Platelets: 288 10*3/uL (ref 150–400)
RBC: 3.44 MIL/uL — ABNORMAL LOW (ref 4.22–5.81)
RDW: 13.2 % (ref 11.5–15.5)
WBC: 6.2 10*3/uL (ref 4.0–10.5)
nRBC: 0 % (ref 0.0–0.2)

## 2019-01-04 LAB — HEPARIN LEVEL (UNFRACTIONATED): Heparin Unfractionated: 0.46 IU/mL (ref 0.30–0.70)

## 2019-01-04 MED ORDER — APIXABAN 5 MG PO TABS
10.0000 mg | ORAL_TABLET | Freq: Two times a day (BID) | ORAL | Status: DC
  Administered 2019-01-04 (×2): 10 mg via ORAL
  Filled 2019-01-04 (×2): qty 2

## 2019-01-04 MED ORDER — APIXABAN 5 MG PO TABS: 5.0000 mg | ORAL_TABLET | Freq: Two times a day (BID) | ORAL | Status: DC

## 2019-01-04 MED ORDER — BISACODYL 10 MG RE SUPP: 10.0000 mg | Freq: Every day | RECTAL | Status: DC | PRN

## 2019-01-04 NOTE — Progress Notes (Signed)
PT Cancellation Note  Patient Details Name: Joshua Moyer MRN: [id number] DOB: 1961-04-12   Cancelled Treatment:    Reason Eval/Treat Not Completed: Medical issues which prohibited therapy. Patient recently diagnosed with new L LE DVT. Has been administered heparin but has been less than 24 hours therefore will hold therapy. Will follow up as patient becomes medically appropriate and as schedule allows.    Erick Blinks, SPT  Erick Blinks 01/04/2019, 3:05 PM

## 2019-01-04 NOTE — Progress Notes (Signed)
ANTICOAGULATION CONSULT NOTE - Follow-up Consult  Pharmacy Consult for heparin Indication: DVT  Allergies  Allergen Reactions  . Chlorhexidine Gluconate [Chlorhexidine] Itching  . Statins Other (See Comments)    Leg Pain     Patient Measurements: Height: 6\' 5"  (195.6 cm) Weight: 193 lb (87.5 kg) IBW/kg (Calculated) : 89.1 Heparin Dosing Weight: 87.5kg  Vital Signs: Temp: 98.2 F (36.8 C) (01/12 2100) Temp Source: Oral (01/12 2100) BP: 119/69 (01/12 2100) Pulse Rate: 83 (01/12 2100)  Labs: Recent Labs    01/03/19 1339 01/04/19 0052  HGB 12.1* 11.8*  HCT 37.3* 35.1*  PLT 257 288  HEPARINUNFRC  --  0.46  CREATININE 0.78  --     Estimated Creatinine Clearance: 126.1 mL/min (by C-G formula based on SCr of 0.78 mg/dL).   Assessment: 12 yom presented with an acute DVT to start IV heparin. He is not on anticoagulation PTA despite a history of afib. Baseline Hgb is slightly low and platelets are WNL. He recently had surgery on 1/5.   Heparin level therapeutic (0.46) on gtt at 1400 units/hr. No bleeding noted. CBC stable.  Goal of Therapy:  Heparin level 0.3-0.7 units/ml Monitor platelets by anticoagulation protocol: Yes   Plan:  Heparin gtt 1400 units/hr Check a 6 hr confirmatory heparin level Daily heparin level and CBC  Sherlon Handing, PharmD, BCPS Clinical pharmacist  **Pharmacist phone directory can now be found on [email or web address] ([email or web address]).  Listed under [place].  01/04/2019,2:30 AM

## 2019-01-04 NOTE — Discharge Instructions (Signed)
Information on my medicine - ELIQUIS (apixaban)  This medication education was reviewed with me or my healthcare representative as part of my discharge preparation.    Why was Eliquis prescribed for you? Eliquis was prescribed to treat blood clots that may have been found in the veins of your legs (deep vein thrombosis) or in your lungs (pulmonary embolism) and to reduce the risk of them occurring again.  What do You need to know about Eliquis ? The starting dose is 10 mg (two 5 mg tablets) taken TWICE daily for the FIRST SEVEN (7) DAYS, then on 01/11/2019 the dose is reduced to ONE 5 mg tablet taken TWICE daily.  Eliquis may be taken with or without food.   Try to take the dose about the same time in the morning and in the evening. If you have difficulty swallowing the tablet whole please discuss with your pharmacist how to take the medication safely.  Take Eliquis exactly as prescribed and DO NOT stop taking Eliquis without talking to the doctor who prescribed the medication.  Stopping may increase your risk of developing a new blood clot.  Refill your prescription before you run out.  After discharge, you should have regular check-up appointments with your healthcare provider that is prescribing your Eliquis.    What do you do if you miss a dose? If a dose of ELIQUIS is not taken at the scheduled time, take it as soon as possible on the same day and twice-daily administration should be resumed. The dose should not be doubled to make up for a missed dose.  Important Safety Information A possible side effect of Eliquis is bleeding. You should call your healthcare provider right away if you experience any of the following: ? Bleeding from an injury or your nose that does not stop. ? Unusual colored urine (red or dark brown) or unusual colored stools (red or black). ? Unusual bruising for unknown reasons. ? A serious fall or if you hit your head (even if there is no bleeding).  Some  medicines may interact with Eliquis and might increase your risk of bleeding or clotting while on Eliquis. To help avoid this, consult your healthcare provider or pharmacist prior to using any new prescription or non-prescription medications, including herbals, vitamins, non-steroidal anti-inflammatory drugs (NSAIDs) and supplements.  This website has more information on Eliquis (apixaban): http://www.eliquis.com/eliquis/home

## 2019-01-04 NOTE — Progress Notes (Signed)
Pt's sis Mckinnley Smithey wants RN to [facility] at [phone]

## 2019-01-04 NOTE — Progress Notes (Signed)
Progress Note    Joshua Moyer  ZJQ:[id number] DOB: [birth year]  DOA: 01/03/2019 PCP: Hulan Fess, MD    Brief Narrative:     Medical records reviewed and are as summarized below:  Joshua Moyer is an 58 y.o. male with a history of developmental delay, persistent AFib not on anticoagulation, GERD, and hypothyroidism who presented from SNF with left leg pain. He was recently admitted after a mechanical fall found to have intertrochanteric left hip fracture and underwent IM nail by Dr. Stann Mainland 1/5. He was subsequently discharged to SNF for rehabilitation on aspirin 81mg  po BID for DVT ppx x6 weeks. Hgb at discharge was 13. He now reports 2 days of gradual onset, worsening, severe, throbbing left lower leg pain with intermittent associated tingling.  Assessment/Plan:   Active Problems:   Acute deep vein thrombosis (DVT) (HCC)  Acute left posterior tibial and popliteal DVT: In setting of recent ipsilateral orthopedic surgery despite taking ASA 81mg  BID. No PE. - heparin to eliquis  Mild acute blood loss anemia and RBPR 2 days ago: Suspect an element of anemia from ecchymosis developing postoperatively. - FOBT negative - Pt not due for routine colonoscopy (last was 2013 with only 1 sessile polyp, tubular adenoma on pathology)  Mediastinal and bilateral hilar lymphadenopathy, bilateral pulmonary nodules (<66mm): Incidental finding on CTA chest.  - Discussed at length with pt and POA. He has had no infectious or pulmonary symptoms and no constitutional symptoms of malignancy or autoimmune/inflammatory conditions. I recommended repeat CT chest in about 3 months for this never smoker prior to moving forward with bronch, etc.  Hypothyroidism:  - Continue synthroid  AFib: Has declined anticoagulation in the past due to personal preference but now will be on eliquis - Continue diltiazem, rate is controlled  Left hip fracture s/p IM nail:  - Continue PT. Pt will need placement at  alternative SNF. CSW has been contacted. - Follow up with Dr. Stann Mainland as scheduled in couple weeks.   Family Communication/Anticipated D/C date and plan/Code Status   DVT prophylaxis: eliquis Code Status: Full Code.  Family Communication: Disposition Plan: awaiting Social work for placement   Medical Consultants:     Subjective:   Says he has been constipated at rehab  Objective:    Vitals:   01/03/19 1755 01/03/19 2100 01/04/19 0600 01/04/19 1207  BP: 118/75 119/69 124/88 126/80  Pulse: 82 83 85 93  Resp: 18 18 18 18   Temp: 97.7 F (36.5 C) 98.2 F (36.8 C) 97.9 F (36.6 C) (!) 97.5 F (36.4 C)  TempSrc: Oral Oral Oral Oral  SpO2: 98% 96% 98% 98%  Weight:      Height:        Intake/Output Summary (Last 24 hours) at 01/04/2019 1450 Last data filed at 01/04/2019 1227 Gross per 24 hour  Intake 845.08 ml  Output 1646 ml  Net -800.92 ml   Filed Weights   01/03/19 1324  Weight: 87.5 kg    Exam: In chair, NAD Pleasant and cooperative rrr Mild left leg swelling  Data Reviewed:   I have personally reviewed following labs and imaging studies:  Labs: Labs show the following:   Basic Metabolic Panel: Recent Labs  Lab 01/03/19 1339  NA 138  K 3.9  CL 104  CO2 27  GLUCOSE 96  BUN 13  CREATININE 0.78  CALCIUM 9.0   GFR Estimated Creatinine Clearance: 126.1 mL/min (by C-G formula based on SCr of 0.78 mg/dL). Liver Function Tests: No results for  input(s): AST, ALT, ALKPHOS, BILITOT, PROT, ALBUMIN in the last 168 hours. No results for input(s): LIPASE, AMYLASE in the last 168 hours. No results for input(s): AMMONIA in the last 168 hours. Coagulation profile No results for input(s): INR, PROTIME in the last 168 hours.  CBC: Recent Labs  Lab 01/03/19 1339 01/04/19 0052  WBC 6.4 6.2  NEUTROABS 4.4  --   HGB 12.1* 11.8*  HCT 37.3* 35.1*  MCV 103.0* 102.0*  PLT 257 288   Cardiac Enzymes: No results for input(s): CKTOTAL, CKMB, CKMBINDEX,  TROPONINI in the last 168 hours. BNP (last 3 results) No results for input(s): PROBNP in the last 8760 hours. CBG: No results for input(s): GLUCAP in the last 168 hours. D-Dimer: No results for input(s): DDIMER in the last 72 hours. Hgb A1c: No results for input(s): HGBA1C in the last 72 hours. Lipid Profile: No results for input(s): CHOL, HDL, LDLCALC, TRIG, CHOLHDL, LDLDIRECT in the last 72 hours. Thyroid function studies: No results for input(s): TSH, T4TOTAL, T3FREE, THYROIDAB in the last 72 hours.  Invalid input(s): FREET3 Anemia work up: No results for input(s): VITAMINB12, FOLATE, FERRITIN, TIBC, IRON, RETICCTPCT in the last 72 hours. Sepsis Labs: Recent Labs  Lab 01/03/19 1339 01/04/19 0052  WBC 6.4 6.2    Microbiology Recent Results (from the past 240 hour(s))  MRSA PCR Screening     Status: None   Collection Time: 12/27/18  9:13 AM  Result Value Ref Range Status   MRSA by PCR NEGATIVE NEGATIVE Final    Comment:        The GeneXpert MRSA Assay (FDA approved for NASAL specimens only), is one component of a comprehensive MRSA colonization surveillance program. It is not intended to diagnose MRSA infection nor to guide or monitor treatment for MRSA infections. Performed at [facility], [place] [address] [place], [place] [address]     Procedures and diagnostic studies:  Ct Angio Chest Pe W And/or Wo Contrast  Result Date: 01/03/2019 CLINICAL DATA:  Left leg swelling pain and bruising. EXAM: CT ANGIOGRAPHY CHEST WITH CONTRAST TECHNIQUE: Multidetector CT imaging of the chest was performed using the standard protocol during bolus administration of intravenous contrast. Multiplanar CT image reconstructions and MIPs were obtained to evaluate the vascular anatomy. CONTRAST:  145mL ISOVUE-370 IOPAMIDOL (ISOVUE-370) INJECTION 76% COMPARISON:  None. FINDINGS: Cardiovascular: The heart size is normal. No substantial pericardial effusion. Coronary artery calcification  is evident. No thoracic aortic aneurysm. No filling defect in the opacified pulmonary arteries to suggest the presence of an acute pulmonary embolus. Mediastinum/Nodes: 1.5 cm short axis precarinal lymph node is associated with other mediastinal lymphadenopathy. 1.8 cm short axis AP window lymph node evident. 1.9 cm short axis lymph node identified in the right hilum. Prominent lymph nodes are seen in the left hilum with a posterior left hilar node measuring 1.4 cm short axis. Punctate calcification noted in 1 of the AP window lymph nodes. The esophagus has normal imaging features. There is no axillary lymphadenopathy. Lungs/Pleura: The central tracheobronchial airways are patent. 5 mm right middle lobe nodule identified on 91/6. 6 mm left lower lobe nodule visible on 84/6. Dependent atelectasis noted both lower lobes. Upper Abdomen: Unremarkable. Musculoskeletal: No worrisome lytic or sclerotic osseous abnormality. Review of the MIP images confirms the above findings. IMPRESSION: 1. No CT evidence for acute pulmonary embolus. 2. Mediastinal and bilateral hilar lymphadenopathy, nonspecific. Infectious/inflammatory etiology (including sarcoidosis) is possible and there is a punctate calcification in an AP window lymph node. Metastatic  disease and lymphoma are also within the differential consideration at this point. 3. Bilateral pulmonary nodules measuring up to 6 mm. Attention on follow-up recommended. If lymphadenopathy is found to be metastatic, these nodules will be more concerning for metastatic pulmonary parenchymal involvement. Electronically Signed   By: Misty Stanley M.D.   On: 01/03/2019 16:15   Vas [place] Lower Extremity Venous (dvt) (only Mc & Wl 7a-7p)  Result Date: 01/03/2019  Lower Venous Study Indications: Edema.  Performing Technologist: June Leap RDMS, RVT  Examination Guidelines: A complete evaluation includes B-mode imaging, spectral Doppler, color Doppler, and power Doppler as needed of all  accessible portions of each vessel. Bilateral testing is considered an integral part of a complete examination. Limited examinations for reoccurring indications may be performed as noted.  Right Venous Findings: +---+---------------+---------+-----------+----------+-------+    CompressibilityPhasicitySpontaneityPropertiesSummary +---+---------------+---------+-----------+----------+-------+ CFVFull           Yes      Yes                          +---+---------------+---------+-----------+----------+-------+  Left Venous Findings: +---------+---------------+---------+-----------+----------+-------+          CompressibilityPhasicitySpontaneityPropertiesSummary +---------+---------------+---------+-----------+----------+-------+ CFV      Full           Yes      Yes                          +---------+---------------+---------+-----------+----------+-------+ SFJ      Full                                                 +---------+---------------+---------+-----------+----------+-------+ FV Prox  Full                                                 +---------+---------------+---------+-----------+----------+-------+ FV Mid   Full                                                 +---------+---------------+---------+-----------+----------+-------+ FV DistalFull                                                 +---------+---------------+---------+-----------+----------+-------+ PFV      Full                                                 +---------+---------------+---------+-----------+----------+-------+ POP      Partial        Yes      Yes                  Acute   +---------+---------------+---------+-----------+----------+-------+ PTV      None  Acute   +---------+---------------+---------+-----------+----------+-------+ PERO     Full                                                  +---------+---------------+---------+-----------+----------+-------+ Gastroc  None                                         Acute   +---------+---------------+---------+-----------+----------+-------+ Mobile DVT left popliteal    Summary: Right: No evidence of common femoral vein obstruction. Left: Findings consistent with acute deep vein thrombosis involving the left popliteal vein, and left posterior tibial vein. No cystic structure found in the popliteal fossa.  *See table(s) above for measurements and observations. Electronically signed by Monica Martinez MD on 01/03/2019 at 2:45:20 PM.    Final     Medications:   . apixaban  10 mg Oral BID   Followed by  . [START ON 01/11/2019] apixaban  5 mg Oral BID  . B-complex with vitamin C  1 tablet Oral Daily  . cholecalciferol  1,000 Units Oral Daily  . diltiazem  120 mg Oral Daily  . levothyroxine  25 mcg Oral Q0600  . multivitamin with minerals  1 tablet Oral Daily  . senna-docusate  2 tablet Oral QHS  . tamsulosin  0.4 mg Oral QPC supper  . vitamin C  1,000 mg Oral Daily  . vitamin E  400 Units Oral Daily   Continuous Infusions:   LOS: 0 days   Geradine Girt  [facility]   *Please refer to [email or web address], password [email or web address] to get updated schedule on who will round on this patient, as hospitalists switch teams weekly. If 7PM-7AM, please contact night-coverage at [email or web address], password [email or web address] for any overnight needs.  01/04/2019, 2:50 PM

## 2019-01-04 NOTE — Care Management Obs Status (Signed)
[place] NOTIFICATION   Patient Details  Name: Donyel Nester MRN: [id number] Date of Birth: 1961-12-11   Medicare Observation Status Notification Given:  Yes Discussed via phone with patient's sister, Salik Grewell [facility]).    Midge Minium RN, BSN, NCM-BC, ACM-RN [phone] 01/04/2019, 4:25 PM

## 2019-01-04 NOTE — Progress Notes (Signed)
ANTICOAGULATION CONSULT NOTE - Follow-up Consult  Pharmacy Consult for apixaban Indication: DVT  Allergies  Allergen Reactions  . Chlorhexidine Gluconate [Chlorhexidine] Itching  . Statins Other (See Comments)    Leg Pain     Patient Measurements: Height: 6\' 5"  (195.6 cm) Weight: 193 lb (87.5 kg) IBW/kg (Calculated) : 89.1 Heparin Dosing Weight: 87.5kg  Vital Signs: Temp: 97.9 F (36.6 C) (01/13 0600) Temp Source: Oral (01/13 0600) BP: 124/88 (01/13 0600) Pulse Rate: 85 (01/13 0600)  Labs: Recent Labs    01/03/19 1339 01/04/19 0052  HGB 12.1* 11.8*  HCT 37.3* 35.1*  PLT 257 288  HEPARINUNFRC  --  0.46  CREATININE 0.78  --     Estimated Creatinine Clearance: 126.1 mL/min (by C-G formula based on SCr of 0.78 mg/dL).   Assessment: 60 yom presented with an acute DVT to start IV heparin. He is not on anticoagulation PTA despite a history of afib. Baseline Hgb is slightly low and platelets are WNL. He recently had surgery on 1/5.   MD wishes to change patient to apixaban for DVT. Will initiate this AM and turn heparin off once first dose is given.   Goal of Therapy:  Monitor platelets by anticoagulation protocol: Yes   Plan:  Apixaban 10mg  PO BID x 7 days, then 5mg  BID Turn off heparin once first dose of apixaban is given Monitor for bleeding  Timarie Labell A. Levada Dy, PharmD, [place] Pager: [phone] Please utilize Amion for appropriate phone number to reach the unit pharmacist ([place])  01/04/2019,8:33 AM

## 2019-01-05 DIAGNOSIS — R918 Other nonspecific abnormal finding of lung field: Secondary | ICD-10-CM | POA: Diagnosis not present

## 2019-01-05 DIAGNOSIS — K219 Gastro-esophageal reflux disease without esophagitis: Secondary | ICD-10-CM | POA: Diagnosis not present

## 2019-01-05 DIAGNOSIS — I82442 Acute embolism and thrombosis of left tibial vein: Secondary | ICD-10-CM | POA: Diagnosis not present

## 2019-01-05 DIAGNOSIS — M79605 Pain in left leg: Secondary | ICD-10-CM | POA: Diagnosis not present

## 2019-01-05 DIAGNOSIS — R591 Generalized enlarged lymph nodes: Secondary | ICD-10-CM | POA: Diagnosis not present

## 2019-01-05 DIAGNOSIS — S72142D Displaced intertrochanteric fracture of left femur, subsequent encounter for closed fracture with routine healing: Secondary | ICD-10-CM | POA: Diagnosis not present

## 2019-01-05 DIAGNOSIS — F79 Unspecified intellectual disabilities: Secondary | ICD-10-CM | POA: Diagnosis not present

## 2019-01-05 DIAGNOSIS — D62 Acute posthemorrhagic anemia: Secondary | ICD-10-CM | POA: Diagnosis not present

## 2019-01-05 DIAGNOSIS — R600 Localized edema: Secondary | ICD-10-CM | POA: Diagnosis not present

## 2019-01-05 DIAGNOSIS — Z23 Encounter for immunization: Secondary | ICD-10-CM | POA: Diagnosis not present

## 2019-01-05 DIAGNOSIS — I10 Essential (primary) hypertension: Secondary | ICD-10-CM | POA: Diagnosis not present

## 2019-01-05 DIAGNOSIS — R2689 Other abnormalities of gait and mobility: Secondary | ICD-10-CM | POA: Diagnosis not present

## 2019-01-05 DIAGNOSIS — E039 Hypothyroidism, unspecified: Secondary | ICD-10-CM | POA: Diagnosis not present

## 2019-01-05 DIAGNOSIS — D491 Neoplasm of unspecified behavior of respiratory system: Secondary | ICD-10-CM | POA: Diagnosis not present

## 2019-01-05 DIAGNOSIS — R6250 Unspecified lack of expected normal physiological development in childhood: Secondary | ICD-10-CM | POA: Diagnosis not present

## 2019-01-05 DIAGNOSIS — I4819 Other persistent atrial fibrillation: Secondary | ICD-10-CM | POA: Diagnosis not present

## 2019-01-05 DIAGNOSIS — W19XXXD Unspecified fall, subsequent encounter: Secondary | ICD-10-CM | POA: Diagnosis not present

## 2019-01-05 DIAGNOSIS — Z932 Ileostomy status: Secondary | ICD-10-CM | POA: Diagnosis not present

## 2019-01-05 DIAGNOSIS — S72002A Fracture of unspecified part of neck of left femur, initial encounter for closed fracture: Secondary | ICD-10-CM | POA: Diagnosis not present

## 2019-01-05 DIAGNOSIS — M6281 Muscle weakness (generalized): Secondary | ICD-10-CM | POA: Diagnosis not present

## 2019-01-05 DIAGNOSIS — Z79899 Other long term (current) drug therapy: Secondary | ICD-10-CM | POA: Diagnosis not present

## 2019-01-05 DIAGNOSIS — R590 Localized enlarged lymph nodes: Secondary | ICD-10-CM | POA: Diagnosis not present

## 2019-01-05 DIAGNOSIS — I82432 Acute embolism and thrombosis of left popliteal vein: Secondary | ICD-10-CM | POA: Diagnosis not present

## 2019-01-05 DIAGNOSIS — I480 Paroxysmal atrial fibrillation: Secondary | ICD-10-CM | POA: Diagnosis not present

## 2019-01-05 DIAGNOSIS — Z7982 Long term (current) use of aspirin: Secondary | ICD-10-CM | POA: Diagnosis not present

## 2019-01-05 LAB — CBC
HCT: 43 % (ref 39.0–52.0)
Hemoglobin: 14.5 g/dL (ref 13.0–17.0)
MCH: 34.7 pg — ABNORMAL HIGH (ref 26.0–34.0)
MCHC: 33.7 g/dL (ref 30.0–36.0)
MCV: 102.9 fL — ABNORMAL HIGH (ref 80.0–100.0)
PLATELETS: 327 10*3/uL (ref 150–400)
RBC: 4.18 MIL/uL — ABNORMAL LOW (ref 4.22–5.81)
RDW: 13.4 % (ref 11.5–15.5)
WBC: 5.9 10*3/uL (ref 4.0–10.5)
nRBC: 0 % (ref 0.0–0.2)

## 2019-01-05 MED ORDER — APIXABAN 5 MG PO TABS
10.0000 mg | ORAL_TABLET | Freq: Two times a day (BID) | ORAL | Status: DC
  Administered 2019-01-05: 10 mg via ORAL
  Filled 2019-01-05: qty 2

## 2019-01-05 MED ORDER — ELIQUIS 5 MG VTE STARTER PACK: ORAL_TABLET | ORAL | 0 refills | Status: AC

## 2019-01-05 MED ORDER — BISACODYL 10 MG RE SUPP: 10.0000 mg | Freq: Every day | RECTAL | 0 refills | Status: DC | PRN

## 2019-01-05 MED ORDER — APIXABAN 5 MG PO TABS: 5.0000 mg | ORAL_TABLET | Freq: Two times a day (BID) | ORAL | Status: DC

## 2019-01-05 MED ORDER — HYDROCODONE-ACETAMINOPHEN 5-325 MG PO TABS: 1.0000 | ORAL_TABLET | Freq: Four times a day (QID) | ORAL | 0 refills | Status: DC | PRN

## 2019-01-05 NOTE — Evaluation (Signed)
Occupational Therapy Evaluation Patient Details Name: Joshua Moyer MRN: [id number] DOB: Apr 29, 1961 Today's Date: 01/05/2019    History of Present Illness Pt is 58 yo male with PMH: developmental delay, A-fib, L hip fx 12/27/18 with IM nail. He went to SNF after hip fx and returns now with LLE DVT.     Clinical Impression   PTA, pt was at a SNF for ST-rehab after recent fall. Pt lives with his father and performed BADLs. Pt currently requiring Min A for LB ADLs and Min Guard-Min A for functional mobility with RW. Pt contineus to present with decreased balance and fear of falling. Pt would benefit from further acute OT to facilitate safe dc. Recommend dc to SNF to finish therapy and for further OT to optimize safety, independence with ADLs, and return to PLOF.      Follow Up Recommendations  SNF;Supervision/Assistance - 24 hour    Equipment Recommendations  Other (comment)(Defer to next venue)    Recommendations for Other Services PT consult     Precautions / Restrictions Precautions Precautions: Fall Precaution Comments: h/o falls Restrictions Weight Bearing Restrictions: No LLE Weight Bearing: Weight bearing as tolerated      Mobility Bed Mobility               General bed mobility comments: In recliner upon arrival  Transfers Overall transfer level: Needs assistance Equipment used: Rolling walker (2 wheeled) Transfers: Sit to/from Stand Sit to Stand: Min guard;Min assist         General transfer comment: assist to steady and cues for safe hand placement    Balance Overall balance assessment: Needs assistance;Modified Independent Sitting-balance support: Feet supported;No upper extremity supported Sitting balance-Leahy Scale: Good Sitting balance - Comments: leans right to off weight his left leg   Standing balance support: Bilateral upper extremity supported Standing balance-Leahy Scale: Poor Standing balance comment: dependent on RW                            ADL either performed or assessed with clinical judgement   ADL Overall ADL's : Needs assistance/impaired Eating/Feeding: Independent;Sitting   Grooming: Min guard;Standing   Upper Body Bathing: Set up;Supervision/ safety;Sitting   Lower Body Bathing: Minimal assistance;Sit to/from stand   Upper Body Dressing : Supervision/safety;Set up;Sitting   Lower Body Dressing: Minimal assistance;Sit to/from stand   Toilet Transfer: Min guard;Ambulation;RW(Simulated to recliner)           Functional mobility during ADLs: Min guard;Supervision/safety;Rolling walker General ADL Comments: Pt requiring Min A for LB ADLs and Min Guard A for functional mobility. This OT met pt at last admission. Pt continues to present with anxiety and fear of falling during dynamic balanace. However, pt's activity tolerance has increased since prior admission.      Vision         Perception     Praxis      Pertinent Vitals/Pain Pain Assessment: Faces Faces Pain Scale: Hurts even more Pain Location: L LE with mobility Pain Descriptors / Indicators: Sore;Guarding;Grimacing Pain Intervention(s): Monitored during session;Limited activity within patient's tolerance;Repositioned     Hand Dominance     Extremity/Trunk Assessment Upper Extremity Assessment Upper Extremity Assessment: Overall WFL for tasks assessed   Lower Extremity Assessment Lower Extremity Assessment: Defer to PT evaluation LLE Deficits / Details: Decreased ROM and strength due to operative pain  LLE: Unable to fully assess due to pain   Cervical / Trunk Assessment Cervical / Trunk Assessment:  Normal   Communication Communication Communication: Expressive difficulties;Receptive difficulties   Cognition Arousal/Alertness: Awake/alert Behavior During Therapy: WFL for tasks assessed/performed Overall Cognitive Status: History of cognitive impairments - at baseline                                  General Comments: developmental delays, however able to supply info on prior function and Home living without difficulty    General Comments       Exercises Exercises: General Lower Extremity Total Joint Exercises Ankle Circles/Pumps: AROM;Both Quad Sets: Strengthening;Both;10 reps Short Arc Quad: AROM;Left;10 reps Heel Slides: AAROM;Left;10 reps Hip ABduction/ADduction: AAROM;Left;10 reps Knee Flexion: AROM;Left;10 reps;Standing Marching in Standing: AROM;Left;10 reps;Standing   Shoulder Instructions      Home Living Family/patient expects to be discharged to:: Skilled nursing facility Living Arrangements: Parent;Other (Comment)(sister) Available Help at Discharge: Available 24 hours/day Type of Home: House Home Access: Stairs to enter;Ramped entrance Entrance Stairs-Number of Steps: 2 Entrance Stairs-Rails: Right Home Layout: Two level;1/2 bath on main level;Able to live on main level with bedroom/bathroom(Lift to second floor)     Bathroom Shower/Tub: Tub/shower unit     Bathroom Accessibility: No   Home Equipment: Shower seat - built in   Additional Comments: Pt reports receiving some help from family but only for basic ADLs. Pt will requires assist with mobility for safety.      Prior Functioning/Environment Level of Independence: Independent                 OT Problem List: Decreased range of motion;Decreased safety awareness;Impaired balance (sitting and/or standing);Pain      OT Treatment/Interventions: Self-care/ADL training;DME and/or AE instruction;Patient/family education    OT Goals(Current goals can be found in the care plan section) Acute Rehab OT Goals Patient Stated Goal: get back to [employer] practice OT Goal Formulation: With patient Time For Goal Achievement: 01/19/19 Potential to Achieve Goals: Good  OT Frequency: Min 2X/week   Barriers to D/C: Decreased caregiver support  Lives with 19 yo father       Co-evaluation  PT/OT/SLP Co-Evaluation/Treatment: Yes Reason for Co-Treatment: Necessary to address cognition/behavior during functional activity PT goals addressed during session: Mobility/safety with mobility;Balance;Proper use of DME;Strengthening/ROM OT goals addressed during session: ADL's and self-care      AM-PAC OT "6 Clicks" Daily Activity     Outcome Measure Help from another person eating meals?: None Help from another person taking care of personal grooming?: None Help from another person toileting, which includes using toliet, bedpan, or urinal?: A Little Help from another person bathing (including washing, rinsing, drying)?: A Little Help from another person to put on and taking off regular upper body clothing?: None Help from another person to put on and taking off regular lower body clothing?: A Little 6 Click Score: 21   End of Session Equipment Utilized During Treatment: Gait belt;Rolling walker Nurse Communication: Mobility status;Weight bearing status  Activity Tolerance: Patient tolerated treatment well Patient left: in chair;with call bell/phone within reach;with chair alarm set  OT Visit Diagnosis: Unsteadiness on feet (R26.81);History of falling (Z91.81)                Time: 5456-2563 OT Time Calculation (min): 24 min Charges:  OT General Charges $OT Visit: 1 Visit OT Evaluation $OT Eval Low Complexity: [place], OTR/L Acute Rehab Pager: [phone] Office: [place] 01/05/2019, 12:46 PM

## 2019-01-05 NOTE — Consult Note (Signed)
   [facility] CM Inpatient Consult   01/05/2019  Reford Olliff 01-24-1961 [id number]  Referral received for follow up to assess for [facility] Care Management needs.  Patient with [facility] Patient  With a recent diagnosis of Left LE DVT, recent from a SNF. Chart review reveals per MD note: Joshua Moyer is an 58 y.o. male with a history ofdevelopmental delay, persistent AFib not on anticoagulation, GERD, and hypothyroidism who presented from [facility] left leg pain. He was recently admitted after a mechanical fall found to have intertrochanteric left hip fracture and underwent IM nail by Dr. Stann Mainland 1/5.  Met with the patient at the bedside, no family in the room.  Patient called and dialed number to his sister.  I left a brochure and 24 hour nurse advise line number along with contact for follow up.  Will continue to follow.  Spoke with inpatient RNCM, Lanelle Bal regarding referral and states patient is for SNF.  CSW note reviewed as well.  For questions, please contact:  Natividad Brood, RN BSN [facility] Liaison  [phone] business mobile phone Toll free office [phone]

## 2019-01-05 NOTE — Progress Notes (Signed)
[id number] Anticipated DC date: 01/05/2019 Family notified:Deb Transport by: Family  Per MD patient ready for DC to [facility] . RN, patient, patient's family, and facility notified of DC. Discharge Summary sent to facility. RN given number for report [phone] Room 211. Wait 15 minutes after this note to call for report per facility request. DC packet on chart.  CSW signing off.  [place], [place]

## 2019-01-05 NOTE — Discharge Summary (Signed)
Physician Discharge Summary  Joshua Moyer EHM:[id number] DOB: March 12, 1961 DOA: 01/03/2019  PCP: Hulan Fess, MD  Admit date: 01/03/2019 Discharge date: 01/05/2019  Admitted From: SNF Discharge disposition: SNF   Recommendations for Outpatient Follow-Up:   1. eliquis starter pack for DVT treatment  2. Graded compression stockings for symptomatic relief of DVT 3.  repeat CT chest in about 3 months    Discharge Diagnosis:   Active Problems:   Acute deep vein thrombosis (DVT) ([place])    Discharge Condition: Improved.  Diet recommendation: Low sodium, heart healthy  Wound care: None.  Code status: Full.   History of Present Illness:   Joshua Moyer is a 58 y.o. male with a history of developmental delay, persistent AFib not on anticoagulation, GERD, and hypothyroidism who presented from SNF with left leg pain. He was recently admitted after a mechanical fall found to have intertrochanteric left hip fracture and underwent IM nail by Dr. Stann Mainland 1/5. He was subsequently discharged to SNF for rehabilitation on aspirin 81mg  po BID for DVT ppx x6 weeks. Hgb at discharge was 13. He now reports 2 days of gradual onset, worsening, severe, throbbing left lower leg pain with intermittent associated tingling. Better with meds for left hip pain, not better with elevation. No associated chest pain, dyspnea, palpitations.    Hospital Course by Problem:   Acute left posterior tibial and popliteal DVT: In setting of recent ipsilateral orthopedic surgery despite taking ASA 81mg  BID. No PE. - heparin changed to eliquis  Resolved ABLA - FOBT negative - Pt not due for routine colonoscopy (last was 2013 with only 1 sessile polyp, tubular adenoma on pathology)  Mediastinal and bilateral hilar lymphadenopathy, bilateral pulmonary nodules (<62mm): Incidental finding on CTA chest.  - Discussed at length with pt and POA. He has had no infectious or pulmonary symptoms and no  constitutional symptoms of malignancy or autoimmune/inflammatory conditions. I recommended repeat CT chest in about 3 months for this never smoker prior to moving forward with bronch, etc.  Hypothyroidism:  - Continue synthroid  AFib: Has declined anticoagulation in the past due to personal preference but now will be on eliquis for at least 3 months to treat his DVT - Continue diltiazem, rate is controlled  Left hip fracture s/p IM nail:  - Continue PT.  - Follow up with Dr. Stann Mainland as scheduled in couple weeks.     Medical Consultants:      Discharge Exam:   Vitals:   01/04/19 2300 01/05/19 0500  BP: 120/77 121/86  Pulse: 79 89  Resp: 14 16  Temp: 98 F (36.7 C) 97.6 F (36.4 C)  SpO2: 97% 98%   Vitals:   01/04/19 1658 01/04/19 1934 01/04/19 2300 01/05/19 0500  BP: 109/71 112/64 120/77 121/86  Pulse: 98 (!) 101 79 89  Resp: 18 18 14 16   Temp: 98.4 F (36.9 C) 97.8 F (36.6 C) 98 F (36.7 C) 97.6 F (36.4 C)  TempSrc: Oral Oral Oral Oral  SpO2: 95% 95% 97% 98%  Weight:      Height:        General exam: Appears calm and comfortable   The results of significant diagnostics from this hospitalization (including imaging, microbiology, ancillary and laboratory) are listed below for reference.     Procedures and Diagnostic Studies:   Ct Angio Chest Pe W And/or Wo Contrast  Result Date: 01/03/2019 CLINICAL DATA:  Left leg swelling pain and bruising. EXAM: CT ANGIOGRAPHY CHEST WITH CONTRAST TECHNIQUE: Multidetector  CT imaging of the chest was performed using the standard protocol during bolus administration of intravenous contrast. Multiplanar CT image reconstructions and MIPs were obtained to evaluate the vascular anatomy. CONTRAST:  154mL ISOVUE-370 IOPAMIDOL (ISOVUE-370) INJECTION 76% COMPARISON:  None. FINDINGS: Cardiovascular: The heart size is normal. No substantial pericardial effusion. Coronary artery calcification is evident. No thoracic aortic aneurysm.  No filling defect in the opacified pulmonary arteries to suggest the presence of an acute pulmonary embolus. Mediastinum/Nodes: 1.5 cm short axis precarinal lymph node is associated with other mediastinal lymphadenopathy. 1.8 cm short axis AP window lymph node evident. 1.9 cm short axis lymph node identified in the right hilum. Prominent lymph nodes are seen in the left hilum with a posterior left hilar node measuring 1.4 cm short axis. Punctate calcification noted in 1 of the AP window lymph nodes. The esophagus has normal imaging features. There is no axillary lymphadenopathy. Lungs/Pleura: The central tracheobronchial airways are patent. 5 mm right middle lobe nodule identified on 91/6. 6 mm left lower lobe nodule visible on 84/6. Dependent atelectasis noted both lower lobes. Upper Abdomen: Unremarkable. Musculoskeletal: No worrisome lytic or sclerotic osseous abnormality. Review of the MIP images confirms the above findings. IMPRESSION: 1. No CT evidence for acute pulmonary embolus. 2. Mediastinal and bilateral hilar lymphadenopathy, nonspecific. Infectious/inflammatory etiology (including sarcoidosis) is possible and there is a punctate calcification in an AP window lymph node. Metastatic disease and lymphoma are also within the differential consideration at this point. 3. Bilateral pulmonary nodules measuring up to 6 mm. Attention on follow-up recommended. If lymphadenopathy is found to be metastatic, these nodules will be more concerning for metastatic pulmonary parenchymal involvement. Electronically Signed   By: Misty Stanley M.D.   On: 01/03/2019 16:15   Vas [place] Lower Extremity Venous (dvt) (only Mc & Wl 7a-7p)  Result Date: 01/03/2019  Lower Venous Study Indications: Edema.  Performing Technologist: June Leap RDMS, RVT  Examination Guidelines: A complete evaluation includes B-mode imaging, spectral Doppler, color Doppler, and power Doppler as needed of all accessible portions of each vessel.  Bilateral testing is considered an integral part of a complete examination. Limited examinations for reoccurring indications may be performed as noted.  Right Venous Findings: +---+---------------+---------+-----------+----------+-------+    CompressibilityPhasicitySpontaneityPropertiesSummary +---+---------------+---------+-----------+----------+-------+ CFVFull           Yes      Yes                          +---+---------------+---------+-----------+----------+-------+  Left Venous Findings: +---------+---------------+---------+-----------+----------+-------+          CompressibilityPhasicitySpontaneityPropertiesSummary +---------+---------------+---------+-----------+----------+-------+ CFV      Full           Yes      Yes                          +---------+---------------+---------+-----------+----------+-------+ SFJ      Full                                                 +---------+---------------+---------+-----------+----------+-------+ FV Prox  Full                                                 +---------+---------------+---------+-----------+----------+-------+  FV Mid   Full                                                 +---------+---------------+---------+-----------+----------+-------+ FV DistalFull                                                 +---------+---------------+---------+-----------+----------+-------+ PFV      Full                                                 +---------+---------------+---------+-----------+----------+-------+ POP      Partial        Yes      Yes                  Acute   +---------+---------------+---------+-----------+----------+-------+ PTV      None                                         Acute   +---------+---------------+---------+-----------+----------+-------+ PERO     Full                                                  +---------+---------------+---------+-----------+----------+-------+ Gastroc  None                                         Acute   +---------+---------------+---------+-----------+----------+-------+ Mobile DVT left popliteal    Summary: Right: No evidence of common femoral vein obstruction. Left: Findings consistent with acute deep vein thrombosis involving the left popliteal vein, and left posterior tibial vein. No cystic structure found in the popliteal fossa.  *See table(s) above for measurements and observations. Electronically signed by Monica Martinez MD on 01/03/2019 at 2:45:20 PM.    Final      Labs:   Basic Metabolic Panel: Recent Labs  Lab 01/03/19 1339  NA 138  K 3.9  CL 104  CO2 27  GLUCOSE 96  BUN 13  CREATININE 0.78  CALCIUM 9.0   GFR Estimated Creatinine Clearance: 126.1 mL/min (by C-G formula based on SCr of 0.78 mg/dL). Liver Function Tests: No results for input(s): AST, ALT, ALKPHOS, BILITOT, PROT, ALBUMIN in the last 168 hours. No results for input(s): LIPASE, AMYLASE in the last 168 hours. No results for input(s): AMMONIA in the last 168 hours. Coagulation profile No results for input(s): INR, PROTIME in the last 168 hours.  CBC: Recent Labs  Lab 01/03/19 1339 01/04/19 0052 01/05/19 0603  WBC 6.4 6.2 5.9  NEUTROABS 4.4  --   --   HGB 12.1* 11.8* 14.5  HCT 37.3* 35.1* 43.0  MCV 103.0* 102.0* 102.9*  PLT 257 288 327   Cardiac Enzymes: No results for input(s): CKTOTAL, CKMB, CKMBINDEX, TROPONINI in the last 168 hours. BNP: Invalid input(s): POCBNP CBG:  No results for input(s): GLUCAP in the last 168 hours. D-Dimer No results for input(s): DDIMER in the last 72 hours. Hgb A1c No results for input(s): HGBA1C in the last 72 hours. Lipid Profile No results for input(s): CHOL, HDL, LDLCALC, TRIG, CHOLHDL, LDLDIRECT in the last 72 hours. Thyroid function studies No results for input(s): TSH, T4TOTAL, T3FREE, THYROIDAB in the last 72  hours.  Invalid input(s): FREET3 Anemia work up No results for input(s): VITAMINB12, FOLATE, FERRITIN, TIBC, IRON, RETICCTPCT in the last 72 hours. Microbiology Recent Results (from the past 240 hour(s))  MRSA PCR Screening     Status: None   Collection Time: 12/27/18  9:13 AM  Result Value Ref Range Status   MRSA by PCR NEGATIVE NEGATIVE Final    Comment:        The GeneXpert MRSA Assay (FDA approved for NASAL specimens only), is one component of a comprehensive MRSA colonization surveillance program. It is not intended to diagnose MRSA infection nor to guide or monitor treatment for MRSA infections. Performed at [facility], [place] [address], [place], [place] [address]      Discharge Instructions:   Discharge Instructions    Diet - low sodium heart healthy   Complete by:  As directed    Increase activity slowly   Complete by:  As directed      Allergies as of 01/05/2019      Reactions   Chlorhexidine Gluconate [chlorhexidine] Itching   Statins Other (See Comments)   Leg Pain       Medication List    STOP taking these medications   aspirin 81 MG chewable tablet     TAKE these medications   acetaminophen 325 MG tablet Commonly known as:  TYLENOL Take 650 mg by mouth every 4 (four) hours as needed for fever (pain). Do not exceed 4000 mg of acetaminophen in 24 hours - note amount of apap in Norco.   b complex vitamins tablet Take 1 tablet by mouth daily.   bisacodyl 10 MG suppository Commonly known as:  DULCOLAX Place 1 suppository (10 mg total) rectally daily as needed for moderate constipation.   CARDIZEM CD 120 MG 24 hr capsule Generic drug:  diltiazem Take 120 mg by mouth daily.   cholecalciferol 1000 units tablet Commonly known as:  VITAMIN D Take 1,000 Units by mouth daily.   ELIQUIS STARTER PACK 5 MG Tabs Take as directed on package: start with two-5mg  tablets twice daily for 7 days. On day 8, switch to one-5mg  tablet twice daily.    HYDROcodone-acetaminophen 5-325 MG tablet Commonly known as:  NORCO/VICODIN Take 1-2 tablets by mouth every 6 (six) hours as needed for moderate pain.   levothyroxine 25 MCG tablet Commonly known as:  SYNTHROID, LEVOTHROID Take 25 mcg by mouth daily at 6 (six) AM.   methocarbamol 500 MG tablet Commonly known as:  ROBAXIN Take 1 tablet (500 mg total) by mouth 3 (three) times daily.   multivitamin with minerals Tabs tablet Take 1 tablet by mouth daily.   ondansetron 4 MG tablet Commonly known as:  ZOFRAN Take 1 tablet (4 mg total) by mouth every 6 (six) hours as needed for nausea.   senna-docusate 8.6-50 MG tablet Commonly known as:  Senokot-S Take 2 tablets by mouth at bedtime.   tamsulosin 0.4 MG Caps capsule Commonly known as:  FLOMAX Take 1 capsule (0.4 mg total) by mouth daily after supper.   vitamin C 500 MG tablet Commonly known as:  ASCORBIC  ACID Take 1,000 mg by mouth daily.   vitamin E 400 UNIT capsule Take 400 Units by mouth daily.      Follow-up Information    Hulan Fess, MD.   Specialty:  Family Medicine Contact information: [place] [address] [phone]            Time coordinating discharge: 25 min  Signed:  Geradine Girt DO  [facility] 01/05/2019, 9:40 AM

## 2019-01-05 NOTE — Evaluation (Signed)
Physical Therapy Evaluation Patient Details Name: Joshua Moyer MRN: [id number] DOB: December 28, 1960 Today's Date: 01/05/2019   History of Present Illness  Pt is 58 yo male with PMH: developmental delay, A-fib, L hip fx 12/27/18 with IM nail. He went to SNF after hip fx and returns now with LLE DVT.    Clinical Impression  Pt admitted with above diagnosis. Pt currently with functional limitations due to the deficits listed below (see PT Problem List). Pt ambulated 100' with RW and min-guard A with one LOB requiring min A, pt very fearful of falling at this point which further decreases his safety with ambulation. Pt with limited L knee flex and decreased L hip strength. Recommend return to SNF to complete rehab stay before going home.  Pt will benefit from skilled PT to increase their independence and safety with mobility to allow discharge to the venue listed below.       Follow Up Recommendations SNF    Equipment Recommendations  None recommended by PT    Recommendations for Other Services       Precautions / Restrictions Precautions Precautions: Fall Precaution Comments: h/o falls Restrictions Weight Bearing Restrictions: No LLE Weight Bearing: Weight bearing as tolerated      Mobility  Bed Mobility               General bed mobility comments: In recliner upon arrival  Transfers Overall transfer level: Needs assistance Equipment used: Rolling walker (2 wheeled) Transfers: Sit to/from Stand Sit to Stand: Min guard;Min assist         General transfer comment: assist to steady and cues for safe hand placement  Ambulation/Gait Ambulation/Gait assistance: Min assist;Min guard Gait Distance (Feet): 100 Feet Assistive device: Rolling walker (2 wheeled) Gait Pattern/deviations: Decreased stance time - left;Decreased step length - right;Decreased step length - left;Step-to pattern;Antalgic;Decreased dorsiflexion - left;Decreased weight shift to left Gait velocity:  decreased Gait velocity interpretation: <1.31 ft/sec, indicative of household ambulator General Gait Details: pt with decreased L knee flexion with swing through and shortened step length that side as well as antalgia. Min-guard A except one LOB with min A to correct. Pt also very fearful of falling and very cautious which is at times decreasing his safety  Stairs Stairs: Yes Stairs assistance: Min assist Stair Management: One rail Left;Sideways;Step to pattern Number of Stairs: 4 General stair comments: cues for sequencing, technique, and safety; min A to ascend and to descend  Wheelchair Mobility    Modified Rankin (Stroke Patients Only)       Balance Overall balance assessment: Needs assistance;Modified Independent Sitting-balance support: Feet supported;No upper extremity supported Sitting balance-Leahy Scale: Good Sitting balance - Comments: leans right to off weight his left leg   Standing balance support: Bilateral upper extremity supported Standing balance-Leahy Scale: Poor Standing balance comment: dependent on RW                             Pertinent Vitals/Pain Pain Assessment: Faces Faces Pain Scale: Hurts even more Pain Location: L LE with mobility Pain Descriptors / Indicators: Sore;Guarding;Grimacing Pain Intervention(s): Monitored during session;Limited activity within patient's tolerance;Repositioned    Home Living Family/patient expects to be discharged to:: Skilled nursing facility Living Arrangements: Parent;Other (Comment)(sister) Available Help at Discharge: Available 24 hours/day Type of Home: House Home Access: Stairs to enter;Ramped entrance Entrance Stairs-Rails: Right Entrance Stairs-Number of Steps: 2 Home Layout: Two level;1/2 bath on main level;Able to live on main level with  bedroom/bathroom(Lift to second floor) Home Equipment: Shower seat - built in Additional Comments: Pt reports receiving some help from family but only for  basic ADLs. Pt will requires assist with mobility for safety.    Prior Function Level of Independence: Independent   Gait / Transfers Assistance Needed: has been ambulating with RW since hip fx, working with PT at [facility]. Has not tried stairs yet due to L hip pain           Hand Dominance        Extremity/Trunk Assessment   Upper Extremity Assessment Upper Extremity Assessment: Overall WFL for tasks assessed    Lower Extremity Assessment Lower Extremity Assessment: Defer to PT evaluation LLE Deficits / Details: Decreased ROM and strength due to operative pain  LLE: Unable to fully assess due to pain LLE Sensation: WNL LLE Coordination: decreased gross motor    Cervical / Trunk Assessment Cervical / Trunk Assessment: Normal  Communication   Communication: Expressive difficulties;Receptive difficulties  Cognition Arousal/Alertness: Awake/alert Behavior During Therapy: WFL for tasks assessed/performed Overall Cognitive Status: History of cognitive impairments - at baseline                                 General Comments: developmental delays, however able to supply info on prior function and Home living without difficulty       General Comments General comments (skin integrity, edema, etc.): worked on L knee flex stretch. Bruising noted L posterior lower leg    Exercises Total Joint Exercises Ankle Circles/Pumps: AROM;Both Quad Sets: Strengthening;Both;10 reps   Assessment/Plan    PT Assessment Patient needs continued PT services  PT Problem List Decreased strength;Decreased activity tolerance;Decreased balance;Decreased mobility;Decreased cognition;Decreased safety awareness;Pain       PT Treatment Interventions DME instruction;Gait training;Functional mobility training;Therapeutic activities;Therapeutic exercise;Balance training;Cognitive remediation;Patient/family education    PT Goals (Current goals can be found in the Care Plan section)  Acute  Rehab PT Goals Patient Stated Goal: get back to [employer] practice PT Goal Formulation: With patient Time For Goal Achievement: 01/19/19 Potential to Achieve Goals: Good    Frequency Min 3X/week   Barriers to discharge Decreased caregiver support elderly father at home    Co-evaluation PT/OT/SLP Co-Evaluation/Treatment: Yes Reason for Co-Treatment: Necessary to address cognition/behavior during functional activity PT goals addressed during session: Mobility/safety with mobility;Balance;Proper use of DME;Strengthening/ROM OT goals addressed during session: ADL's and self-care       AM-PAC PT "6 Clicks" Mobility  Outcome Measure Help needed turning from your back to your side while in a flat bed without using bedrails?: A Little Help needed moving from lying on your back to sitting on the side of a flat bed without using bedrails?: A Little Help needed moving to and from a bed to a chair (including a wheelchair)?: A Little Help needed standing up from a chair using your arms (e.g., wheelchair or bedside chair)?: A Little Help needed to walk in hospital room?: A Little Help needed climbing 3-5 steps with a railing? : A Lot 6 Click Score: 17    End of Session Equipment Utilized During Treatment: Gait belt Activity Tolerance: Patient tolerated treatment well Patient left: in chair;with call bell/phone within reach Nurse Communication: Mobility status PT Visit Diagnosis: Unsteadiness on feet (R26.81);Other abnormalities of gait and mobility (R26.89);History of falling (Z91.81);Muscle weakness (generalized) (M62.81);Difficulty in walking, not elsewhere classified (R26.2);Pain Pain - Right/Left: Left Pain - part of body: Hip  Time: 1994-1290 PT Time Calculation (min) (ACUTE ONLY): 24 min   Charges:   PT Evaluation $PT Eval Moderate Complexity: [place], [place]  Pager [phone] Office [place] 01/05/2019, 12:57 PM

## 2019-01-05 NOTE — Progress Notes (Addendum)
Update: [employer] called requesting PT note from today due to it not being completed yesterday. CSW paged PT and notified nurse.    CSW followed up with [employer] on this day. They report patient file is still under review and will be reviewed by the Medical Director due to it being a SNF to SNF transfer. [employer] will contact CSW when insurance Josem Kaufmann is received.   [place], [place]

## 2019-01-05 NOTE — Clinical Social Work Placement (Signed)
   CLINICAL SOCIAL WORK PLACEMENT  NOTE  Date:  01/05/2019  Patient Details  Name: Joshua Moyer MRN: [id number] Date of Birth: [birth year]  Clinical Social Work is seeking post-discharge placement for this patient at the [place] level of care (*CSW will initial, date and re-position this form in  chart as items are completed):      Patient/family provided with [facility] Work Department's list of facilities offering this level of care within the geographic area requested by the patient (or if unable, by the patient's family).  Yes   Patient/family informed of their freedom to choose among providers that offer the needed level of care, that participate in Medicare, Medicaid or managed care program needed by the patient, have an available bed and are willing to accept the patient.      Patient/family informed of [facility]'s ownership interest in [facility] and [facility], as well as of the fact that they are under no obligation to receive care at these facilities.  PASRR submitted to EDS on       PASRR number received on 12/29/18     Existing PASRR number confirmed on       FL2 transmitted to all facilities in geographic area requested by pt/family on 01/04/19     FL2 transmitted to all facilities within larger geographic area on 01/04/19     Patient informed that his/her managed care company has contracts with or will negotiate with certain facilities, including the following:        Yes   Patient/family informed of bed offers received.  Patient chooses bed at Other - please specify in the comment section below:([facility])     Physician recommends and patient chooses bed at      Patient to be transferred to Other - please specify in the comment section below:([facility]) on 01/05/19.  Patient to be transferred to facility by family     Patient family notified on 01/05/19 of transfer.  Name of  family member notified:  Deb     PHYSICIAN       Additional Comment:    _______________________________________________ Alberteen Sam, LCSW 01/05/2019, 4:09 PM

## 2019-01-05 NOTE — Progress Notes (Addendum)
CSW consulted with patient's sister [name] on the evening of 01/04/2019 . Deb reports patient will not be returning to [facility] and she has filed a complaint with the SNF. Deb reported contacting [facility] who reports they have beds avail and can accept patient. Deb would like patient to go to [facility].   CSW consulted with [facility] and faxed over clinicals requested. They report needing to re do [employer].   CSW initiated insurance auth with [employer] on 01/04/2019.   CSW will continue to follow up on insurance auth for patient's discharge to [facility].   [place], [employer]

## 2019-01-05 NOTE — Progress Notes (Signed)
Report called to [facility] at [facility]. Sister coming to pick up patient. Discharge med rec and prescription sent with patient.

## 2019-01-07 DIAGNOSIS — E039 Hypothyroidism, unspecified: Secondary | ICD-10-CM | POA: Diagnosis not present

## 2019-01-07 DIAGNOSIS — R6250 Unspecified lack of expected normal physiological development in childhood: Secondary | ICD-10-CM | POA: Diagnosis not present

## 2019-01-07 DIAGNOSIS — I480 Paroxysmal atrial fibrillation: Secondary | ICD-10-CM | POA: Diagnosis not present

## 2019-01-07 DIAGNOSIS — I82432 Acute embolism and thrombosis of left popliteal vein: Secondary | ICD-10-CM | POA: Diagnosis not present

## 2019-01-12 DIAGNOSIS — M79605 Pain in left leg: Secondary | ICD-10-CM | POA: Diagnosis not present

## 2019-01-12 DIAGNOSIS — Z932 Ileostomy status: Secondary | ICD-10-CM | POA: Diagnosis not present

## 2019-01-12 DIAGNOSIS — S72142D Displaced intertrochanteric fracture of left femur, subsequent encounter for closed fracture with routine healing: Secondary | ICD-10-CM | POA: Diagnosis not present

## 2019-01-12 DIAGNOSIS — R600 Localized edema: Secondary | ICD-10-CM | POA: Diagnosis not present

## 2019-01-21 DIAGNOSIS — F79 Unspecified intellectual disabilities: Secondary | ICD-10-CM | POA: Diagnosis not present

## 2019-01-21 DIAGNOSIS — E039 Hypothyroidism, unspecified: Secondary | ICD-10-CM | POA: Diagnosis not present

## 2019-01-21 DIAGNOSIS — I4819 Other persistent atrial fibrillation: Secondary | ICD-10-CM | POA: Diagnosis not present

## 2019-01-21 DIAGNOSIS — I10 Essential (primary) hypertension: Secondary | ICD-10-CM | POA: Diagnosis not present

## 2019-01-26 DIAGNOSIS — S72142D Displaced intertrochanteric fracture of left femur, subsequent encounter for closed fracture with routine healing: Secondary | ICD-10-CM | POA: Diagnosis not present

## 2019-01-26 DIAGNOSIS — I4819 Other persistent atrial fibrillation: Secondary | ICD-10-CM | POA: Diagnosis not present

## 2019-01-26 DIAGNOSIS — I82442 Acute embolism and thrombosis of left tibial vein: Secondary | ICD-10-CM | POA: Diagnosis not present

## 2019-01-26 DIAGNOSIS — I4892 Unspecified atrial flutter: Secondary | ICD-10-CM | POA: Diagnosis not present

## 2019-01-26 DIAGNOSIS — K219 Gastro-esophageal reflux disease without esophagitis: Secondary | ICD-10-CM | POA: Diagnosis not present

## 2019-01-26 DIAGNOSIS — E039 Hypothyroidism, unspecified: Secondary | ICD-10-CM | POA: Diagnosis not present

## 2019-01-26 DIAGNOSIS — Z9181 History of falling: Secondary | ICD-10-CM | POA: Diagnosis not present

## 2019-01-26 DIAGNOSIS — F79 Unspecified intellectual disabilities: Secondary | ICD-10-CM | POA: Diagnosis not present

## 2019-01-26 DIAGNOSIS — I10 Essential (primary) hypertension: Secondary | ICD-10-CM | POA: Diagnosis not present

## 2019-01-26 DIAGNOSIS — I82432 Acute embolism and thrombosis of left popliteal vein: Secondary | ICD-10-CM | POA: Diagnosis not present

## 2019-01-26 DIAGNOSIS — D491 Neoplasm of unspecified behavior of respiratory system: Secondary | ICD-10-CM | POA: Diagnosis not present

## 2019-01-28 DIAGNOSIS — R590 Localized enlarged lymph nodes: Secondary | ICD-10-CM | POA: Diagnosis not present

## 2019-01-28 DIAGNOSIS — I82402 Acute embolism and thrombosis of unspecified deep veins of left lower extremity: Secondary | ICD-10-CM | POA: Diagnosis not present

## 2019-01-28 DIAGNOSIS — S72002A Fracture of unspecified part of neck of left femur, initial encounter for closed fracture: Secondary | ICD-10-CM | POA: Diagnosis not present

## 2019-01-28 DIAGNOSIS — I4891 Unspecified atrial fibrillation: Secondary | ICD-10-CM | POA: Diagnosis not present

## 2019-01-30 DIAGNOSIS — R590 Localized enlarged lymph nodes: Secondary | ICD-10-CM | POA: Diagnosis not present

## 2019-01-30 DIAGNOSIS — R072 Precordial pain: Secondary | ICD-10-CM | POA: Diagnosis not present

## 2019-01-31 DIAGNOSIS — R590 Localized enlarged lymph nodes: Secondary | ICD-10-CM | POA: Diagnosis not present

## 2019-01-31 DIAGNOSIS — R072 Precordial pain: Secondary | ICD-10-CM | POA: Diagnosis not present

## 2019-02-22 DIAGNOSIS — Z4789 Encounter for other orthopedic aftercare: Secondary | ICD-10-CM | POA: Diagnosis not present

## 2019-03-04 ENCOUNTER — Ambulatory Visit (INDEPENDENT_AMBULATORY_CARE_PROVIDER_SITE_OTHER): Payer: Medicare Other | Admitting: Pulmonary Disease

## 2019-03-04 ENCOUNTER — Other Ambulatory Visit: Payer: Self-pay

## 2019-03-04 ENCOUNTER — Encounter: Payer: Self-pay | Admitting: Pulmonary Disease

## 2019-03-04 DIAGNOSIS — R918 Other nonspecific abnormal finding of lung field: Secondary | ICD-10-CM | POA: Diagnosis not present

## 2019-03-04 DIAGNOSIS — R590 Localized enlarged lymph nodes: Secondary | ICD-10-CM

## 2019-03-04 DIAGNOSIS — R911 Solitary pulmonary nodule: Secondary | ICD-10-CM | POA: Diagnosis not present

## 2019-03-04 NOTE — Patient Instructions (Signed)
You have enlarged lymph glands with a speck of calcium You have 2 small nodules of questionable significance. Arrange for CT chest with contrast towards the end of April and follow-up after

## 2019-03-04 NOTE — Assessment & Plan Note (Signed)
Likely benign etiology, low risk for malignancy in this never smoker We will arrange for 63-month follow-up scan

## 2019-03-04 NOTE — Progress Notes (Signed)
Subjective:    Patient ID: Joshua Moyer, male    DOB: 04-04-1961, 58 y.o.   MRN: [id number]  HPI  Chief Complaint  Patient presents with   Pulm Consult    Referred by Dr. Rex Kras for inflammation found on lungs. Pt denies any breathing concerns.    Joshua Moyer is a 58 year old never smoker with mild autism and slight cognitive delay.  He was admitted 12/26/2018 with left hip fracture after a fall and required ORIF.  Course was complicated by left lower extremity DVT involving left popliteal vein and left posterior tibial vein for which she was placed on anticoagulation.  CT angiogram was performed which was negative for bone embolism but picked up 5 mm nodule in the right middle lobe and 6 mm nodule in the left lower lobe.  Mediastinal and bihilar lymphadenopathy was noted-specifically 1.5 cm precarinal lymph node, 1.8 cm AP lymph node 1.9 cm in the right hilum and 1.4 cm in the left hilum.  A punctate calcification was noted in the AP window lymph node.  He denies cough, shortness of breath or wheezing.  He is recovering from hip surgery and still has some residual balance issues.  He is tolerating Eliquis well.  Prior to this DVT episode he was maintained on aspirin for chronic atrial fibrillation  There is no family history of lung cancer.  He has a history of prostate cancer in his father and breast and pancreatic cancer in his mother Is been somewhat concerned about CT scan findings. He lived in [place] for a few years as a child who has mostly lived in [place].  He is accompanied by his sister [name] who is a traveling Music therapist who lives in [place]   Past Medical History:  Diagnosis Date   Atrial fibrillation [facility])    Atrial flutter ([place])    Broken neck ([place])    In 1996   Cognitive developmental delay    DVT (deep venous thrombosis) ([place]) 01/04/2019   Dysrhythmia    GERD (gastroesophageal reflux disease)    Hip injury    Hypothyroidism    Past Surgical  History:  Procedure Laterality Date   CARDIAC ELECTROPHYSIOLOGY STUDY AND ABLATION     FEMUR IM NAIL Left 12/27/2018   INGUINAL HERNIA REPAIR     right   INTRAMEDULLARY (IM) NAIL INTERTROCHANTERIC Left 12/27/2018   Procedure: INTRAMEDULLARY (IM) NAIL, LEFT HIP;  Surgeon: Nicholes Stairs, MD;  Location: [facility];  Service: Orthopedics;  Laterality: Left;   IR GENERIC HISTORICAL  09/10/2016   IR RADIOLOGIST EVAL & MGMT 09/10/2016 Corrie Mckusick, DO [facility] INTERV RAD   LAPAROSCOPIC APPENDECTOMY N/A 11/04/2016   Procedure: APPENDECTOMY LAPAROSCOPIC;  Surgeon: Autumn Messing III, MD;  Location: [facility];  Service: General;  Laterality: N/A;   NECK SURGERY     broke neck in 1996    Allergies  Allergen Reactions   Chlorhexidine Gluconate [Chlorhexidine] Itching   Statins Other (See Comments)    Leg Pain     Social History   Socioeconomic History   Marital status: Single    Spouse name: Not on file   Number of children: Not on file   Years of education: Not on file   Highest education level: Not on file  Occupational History   Not on file  Social Needs   Financial resource strain: Not on file   Food insecurity:    Worry: Not on file    Inability: Not on file   Transportation needs:  Medical: Not on file    Non-medical: Not on file  Tobacco Use   Smoking status: Never Smoker   Smokeless tobacco: Never Used  Substance and Sexual Activity   Alcohol use: No   Drug use: No   Sexual activity: Not on file  Lifestyle   Physical activity:    Days per week: Not on file    Minutes per session: Not on file   Stress: Not on file  Relationships   Social connections:    Talks on phone: Not on file    Gets together: Not on file    Attends religious service: Not on file    Active member of club or organization: Not on file    Attends meetings of clubs or organizations: Not on file    Relationship status: Not on file   Intimate partner violence:    Fear of current  or ex partner: Not on file    Emotionally abused: Not on file    Physically abused: Not on file    Forced sexual activity: Not on file  Other Topics Concern   Not on file  Social History Narrative   Not on file     Family History  Problem Relation Age of Onset   Diabetes Mother    Breast cancer Mother      Review of Systems  Was referred gait issues due to recent left hip fracture, anxiety around CT findings   Constitutional: negative for anorexia, fevers and sweats  Eyes: negative for irritation, redness and visual disturbance  Ears, nose, mouth, throat, and face: negative for earaches, epistaxis, nasal congestion and sore throat  Respiratory: negative for cough, dyspnea on exertion, sputum and wheezing  Cardiovascular: negative for chest pain, dyspnea, lower extremity edema, orthopnea, palpitations and syncope  Gastrointestinal: negative for abdominal pain, constipation, diarrhea, melena, nausea and vomiting  Genitourinary:negative for dysuria, frequency and hematuria  Hematologic/lymphatic: negative for bleeding, easy bruising and lymphadenopathy  Musculoskeletal:negative for arthralgias, muscle weakness and stiff joints  Neurological: negative for coordination problems,  headaches and weakness  Endocrine: negative for diabetic symptoms including polydipsia, polyuria and weight loss     Objective:   Physical Exam  Gen. Pleasant, tall thin in no distress, normal affect ENT - no pallor,icterus, no post nasal drip Neck: No JVD, no thyromegaly, no carotid bruits Lungs: no use of accessory muscles, no dullness to percussion, clear without rales or rhonchi  Cardiovascular: Rhythm regular, heart sounds  normal, no murmurs or gallops, no peripheral edema Abdomen: soft and non-tender, no hepatosplenomegaly, BS normal. Musculoskeletal: No deformities, no cyanosis or clubbing Neuro:  alert, non focal, answers questions appropriately       Assessment & Plan:

## 2019-03-04 NOTE — Assessment & Plan Note (Signed)
Pinpoint calcification in 1 of the AP lymph nodes indicates likely benign etiology. None apparent on history.  He has lived in [place] for small duration but mostly [place] We will repeat CT chest with IV contrast in 3 months which would be end April

## 2019-03-08 DIAGNOSIS — H25013 Cortical age-related cataract, bilateral: Secondary | ICD-10-CM | POA: Diagnosis not present

## 2019-03-08 DIAGNOSIS — H40013 Open angle with borderline findings, low risk, bilateral: Secondary | ICD-10-CM | POA: Diagnosis not present

## 2019-03-08 DIAGNOSIS — H5015 Alternating exotropia: Secondary | ICD-10-CM | POA: Diagnosis not present

## 2019-03-08 DIAGNOSIS — H2513 Age-related nuclear cataract, bilateral: Secondary | ICD-10-CM | POA: Diagnosis not present

## 2019-03-09 DIAGNOSIS — I82492 Acute embolism and thrombosis of other specified deep vein of left lower extremity: Secondary | ICD-10-CM | POA: Diagnosis not present

## 2019-03-09 DIAGNOSIS — I4821 Permanent atrial fibrillation: Secondary | ICD-10-CM | POA: Diagnosis not present

## 2019-03-09 DIAGNOSIS — Z7901 Long term (current) use of anticoagulants: Secondary | ICD-10-CM | POA: Diagnosis not present

## 2019-03-09 DIAGNOSIS — I82409 Acute embolism and thrombosis of unspecified deep veins of unspecified lower extremity: Secondary | ICD-10-CM | POA: Diagnosis not present

## 2019-03-25 DIAGNOSIS — M79673 Pain in unspecified foot: Secondary | ICD-10-CM | POA: Diagnosis not present

## 2019-03-25 DIAGNOSIS — G4700 Insomnia, unspecified: Secondary | ICD-10-CM | POA: Diagnosis not present

## 2019-03-25 DIAGNOSIS — M79671 Pain in right foot: Secondary | ICD-10-CM | POA: Diagnosis not present

## 2019-04-27 ENCOUNTER — Other Ambulatory Visit: Payer: Self-pay

## 2019-04-27 DIAGNOSIS — R590 Localized enlarged lymph nodes: Secondary | ICD-10-CM

## 2019-04-30 DIAGNOSIS — E039 Hypothyroidism, unspecified: Secondary | ICD-10-CM | POA: Diagnosis not present

## 2019-04-30 DIAGNOSIS — Z79899 Other long term (current) drug therapy: Secondary | ICD-10-CM | POA: Diagnosis not present

## 2019-04-30 DIAGNOSIS — E7800 Pure hypercholesterolemia, unspecified: Secondary | ICD-10-CM | POA: Diagnosis not present

## 2019-04-30 DIAGNOSIS — Z125 Encounter for screening for malignant neoplasm of prostate: Secondary | ICD-10-CM | POA: Diagnosis not present

## 2019-05-26 DIAGNOSIS — M25552 Pain in left hip: Secondary | ICD-10-CM | POA: Diagnosis not present

## 2019-05-29 DIAGNOSIS — I4819 Other persistent atrial fibrillation: Secondary | ICD-10-CM | POA: Diagnosis not present

## 2019-05-29 DIAGNOSIS — E039 Hypothyroidism, unspecified: Secondary | ICD-10-CM | POA: Diagnosis not present

## 2019-05-29 DIAGNOSIS — I10 Essential (primary) hypertension: Secondary | ICD-10-CM | POA: Diagnosis not present

## 2019-05-29 DIAGNOSIS — I82442 Acute embolism and thrombosis of left tibial vein: Secondary | ICD-10-CM | POA: Diagnosis not present

## 2019-05-29 DIAGNOSIS — Z9181 History of falling: Secondary | ICD-10-CM | POA: Diagnosis not present

## 2019-05-29 DIAGNOSIS — F840 Autistic disorder: Secondary | ICD-10-CM | POA: Diagnosis not present

## 2019-05-29 DIAGNOSIS — F79 Unspecified intellectual disabilities: Secondary | ICD-10-CM | POA: Diagnosis not present

## 2019-05-29 DIAGNOSIS — D491 Neoplasm of unspecified behavior of respiratory system: Secondary | ICD-10-CM | POA: Diagnosis not present

## 2019-05-29 DIAGNOSIS — I4892 Unspecified atrial flutter: Secondary | ICD-10-CM | POA: Diagnosis not present

## 2019-05-29 DIAGNOSIS — S72142D Displaced intertrochanteric fracture of left femur, subsequent encounter for closed fracture with routine healing: Secondary | ICD-10-CM | POA: Diagnosis not present

## 2019-05-29 DIAGNOSIS — I82432 Acute embolism and thrombosis of left popliteal vein: Secondary | ICD-10-CM | POA: Diagnosis not present

## 2019-06-30 DIAGNOSIS — E039 Hypothyroidism, unspecified: Secondary | ICD-10-CM | POA: Diagnosis not present

## 2019-06-30 DIAGNOSIS — I82432 Acute embolism and thrombosis of left popliteal vein: Secondary | ICD-10-CM | POA: Diagnosis not present

## 2019-06-30 DIAGNOSIS — I10 Essential (primary) hypertension: Secondary | ICD-10-CM | POA: Diagnosis not present

## 2019-06-30 DIAGNOSIS — I82442 Acute embolism and thrombosis of left tibial vein: Secondary | ICD-10-CM | POA: Diagnosis not present

## 2019-06-30 DIAGNOSIS — I4892 Unspecified atrial flutter: Secondary | ICD-10-CM | POA: Diagnosis not present

## 2019-06-30 DIAGNOSIS — S72142D Displaced intertrochanteric fracture of left femur, subsequent encounter for closed fracture with routine healing: Secondary | ICD-10-CM | POA: Diagnosis not present

## 2019-06-30 DIAGNOSIS — Z9181 History of falling: Secondary | ICD-10-CM | POA: Diagnosis not present

## 2019-06-30 DIAGNOSIS — D491 Neoplasm of unspecified behavior of respiratory system: Secondary | ICD-10-CM | POA: Diagnosis not present

## 2019-06-30 DIAGNOSIS — F79 Unspecified intellectual disabilities: Secondary | ICD-10-CM | POA: Diagnosis not present

## 2019-06-30 DIAGNOSIS — I4819 Other persistent atrial fibrillation: Secondary | ICD-10-CM | POA: Diagnosis not present

## 2019-06-30 DIAGNOSIS — F840 Autistic disorder: Secondary | ICD-10-CM | POA: Diagnosis not present

## 2019-07-08 DIAGNOSIS — I4891 Unspecified atrial fibrillation: Secondary | ICD-10-CM | POA: Diagnosis not present

## 2019-07-08 DIAGNOSIS — E7800 Pure hypercholesterolemia, unspecified: Secondary | ICD-10-CM | POA: Diagnosis not present

## 2019-07-08 DIAGNOSIS — F79 Unspecified intellectual disabilities: Secondary | ICD-10-CM | POA: Diagnosis not present

## 2019-07-08 DIAGNOSIS — E039 Hypothyroidism, unspecified: Secondary | ICD-10-CM | POA: Diagnosis not present

## 2019-09-22 ENCOUNTER — Encounter: Payer: Self-pay | Admitting: Pulmonary Disease

## 2019-10-21 ENCOUNTER — Telehealth: Payer: Self-pay

## 2019-10-21 ENCOUNTER — Telehealth: Payer: Self-pay | Admitting: *Deleted

## 2019-10-21 DIAGNOSIS — R911 Solitary pulmonary nodule: Secondary | ICD-10-CM

## 2019-10-21 NOTE — Telephone Encounter (Signed)
Called patient left message to call back about appt that was canceled on 11/11 for CT [phone]

## 2019-10-21 NOTE — Telephone Encounter (Signed)
-----   Message from Osvaldo Shipper, [place] sent at 10/21/2019  4:22 PM EDT ----- Regarding: CT need New order and to be Resch Please put in new order and reschedule patient at another facility. We are unable to do CT because of insurance. Pt of Dr Elsworth Soho CT Chest W scheduled for 11/11.   Pt is NOT aware appt was canceled. Called sister Deb left message appt was canceled left my number to call back.   Thank you   Erline Levine

## 2019-11-03 ENCOUNTER — Other Ambulatory Visit: Payer: Self-pay

## 2019-11-03 ENCOUNTER — Inpatient Hospital Stay: Admission: RE | Admit: 2019-11-03 | Payer: Medicare Other | Source: Ambulatory Visit

## 2019-11-03 ENCOUNTER — Ambulatory Visit
Admission: RE | Admit: 2019-11-03 | Discharge: 2019-11-03 | Disposition: A | Discharge status: Home / Self Care | Payer: Medicare Other | Source: Ambulatory Visit | Attending: Pulmonary Disease | Admitting: Pulmonary Disease

## 2019-11-03 DIAGNOSIS — R911 Solitary pulmonary nodule: Secondary | ICD-10-CM

## 2019-11-03 DIAGNOSIS — R918 Other nonspecific abnormal finding of lung field: Secondary | ICD-10-CM | POA: Diagnosis not present

## 2019-11-03 MED ORDER — IOPAMIDOL (ISOVUE-300) INJECTION 61%
75.0000 mL | Freq: Once | INTRAVENOUS | Status: AC | PRN
  Administered 2019-11-03: 75 mL via INTRAVENOUS

## 2019-11-15 ENCOUNTER — Telehealth: Payer: Self-pay | Admitting: Pulmonary Disease

## 2019-11-15 DIAGNOSIS — R918 Other nonspecific abnormal finding of lung field: Secondary | ICD-10-CM

## 2019-11-16 NOTE — Telephone Encounter (Signed)
11/16/2019 1652  Please notify patient that CT results from 11/03/2019 are listed below:  IMPRESSION: 1. Stable appearance of mediastinal and bilateral hilar adenopathy. The appearance remains nonspecific. Differential considerations include granulomatous inflammation/infection, metastatic adenopathy, or lymphoproliferative disorder. The relative stability suggests a benign etiology. 2. No change in small pulmonary nodules. Future CT at 18-24 months (from 01/03/2019) is considered optional for low-risk patients, but is recommended for high-risk patients. This recommendation follows the consensus statement: Guidelines for Management of Incidental Pulmonary Nodules Detected on CT Images: From the Fleischner Society 2017; Radiology 2017; 284:228-243. 3. Aortic atherosclerosis. Multi vessel coronary artery calcifications noted. 4. Trace pericardial fluid is new from previous exam.  Please explain to the patient that CT results appear stable.  This is good news.  We will route this message to Dr. Elsworth Soho for additional follow-up.  Likely Dr. Elsworth Soho may want to repeat a CT in 18 to 24 months as recommended by radiology.  We will await his response.  Wyn Quaker, FNP

## 2019-11-16 NOTE — Telephone Encounter (Signed)
Call returned to patient, confirmed DOB, requesting results of CT.   B Mack please advise.

## 2019-11-16 NOTE — Telephone Encounter (Signed)
Attempted to call pt's sister [name] but line went straight to VM. Left message for her to return call.

## 2019-11-17 NOTE — Telephone Encounter (Signed)
Agree stable findings are reassuring. CT chest with IV contrast in 1 year

## 2019-11-17 NOTE — Telephone Encounter (Signed)
Spoke with the pt and his sister [name] (with the pt's permission) and notified of results They both verbalized understanding  I have placed order for future CT

## 2019-11-25 NOTE — Telephone Encounter (Signed)
Opened in error.   Nothing needed at this time.

## 2020-03-09 DIAGNOSIS — H43813 Vitreous degeneration, bilateral: Secondary | ICD-10-CM | POA: Diagnosis not present

## 2020-03-09 DIAGNOSIS — M79671 Pain in right foot: Secondary | ICD-10-CM | POA: Diagnosis not present

## 2020-03-09 DIAGNOSIS — M19072 Primary osteoarthritis, left ankle and foot: Secondary | ICD-10-CM | POA: Diagnosis not present

## 2020-03-09 DIAGNOSIS — H35013 Changes in retinal vascular appearance, bilateral: Secondary | ICD-10-CM | POA: Diagnosis not present

## 2020-03-09 DIAGNOSIS — M79672 Pain in left foot: Secondary | ICD-10-CM | POA: Diagnosis not present

## 2020-03-09 DIAGNOSIS — M19071 Primary osteoarthritis, right ankle and foot: Secondary | ICD-10-CM | POA: Diagnosis not present

## 2020-03-09 DIAGNOSIS — H35033 Hypertensive retinopathy, bilateral: Secondary | ICD-10-CM | POA: Diagnosis not present

## 2020-03-09 DIAGNOSIS — H40013 Open angle with borderline findings, low risk, bilateral: Secondary | ICD-10-CM | POA: Diagnosis not present

## 2020-03-25 IMAGING — CT CT CERVICAL SPINE W/O CM
5 of 8 series · 12 of 33 positions shown, 13 images · non-contrast
Comparison: None

CLINICAL DATA: Tripped and fell today

EXAM:
CT HEAD WITHOUT CONTRAST
CT CERVICAL SPINE WITHOUT CONTRAST
TECHNIQUE: Multidetector CT imaging of the head and cervical spine was
performed following the standard protocol without intravenous
contrast. Multiplanar CT image reconstructions of the cervical spine
were also generated.

[Series 6: head 2.0 h70h · axial · 0.48mm/px · z∈[-173,-115]mm · 2 of 89 slices shown]
[im 30/89  bone]
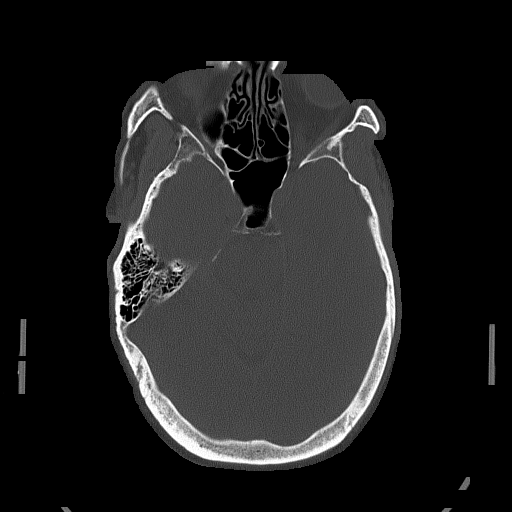
[im 59/89  bone]
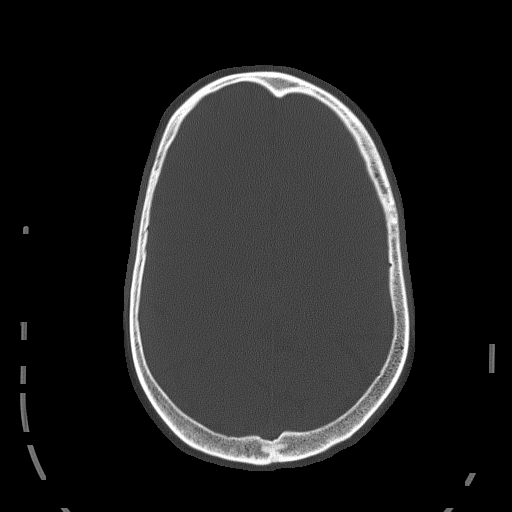

[Series 10: c_spine 2.0 st · axial · 0.36mm/px · z∈[-324,-262]mm · 2 of 94 slices shown, 3 images]
[im 32/94  soft-tissue]
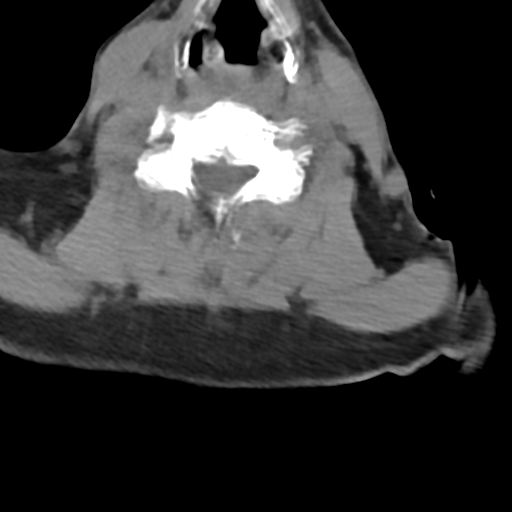
[im 32/94  bone]
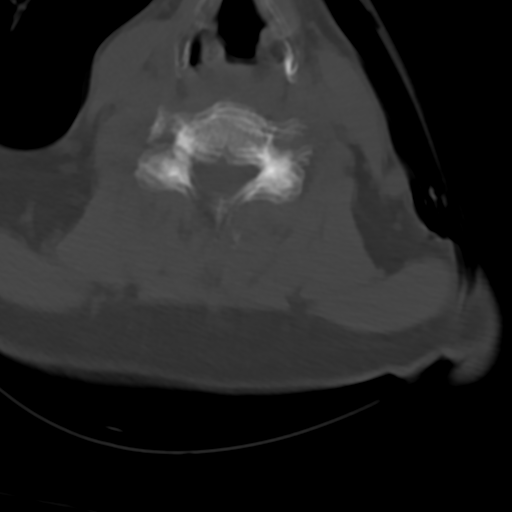
[im 63/94  bone]
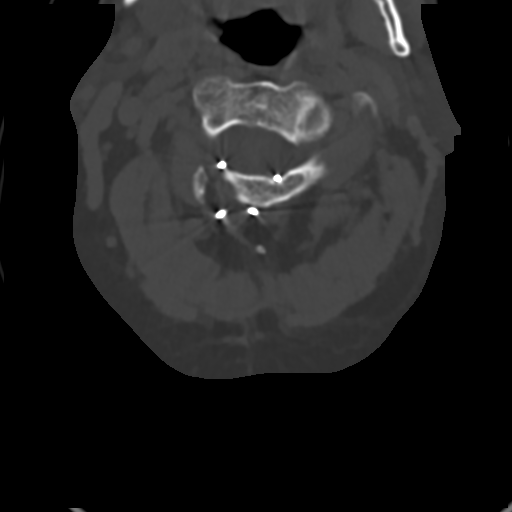

[Series 11: coronal bone · coronal · 0.28mm/px · 1 of 61 slices shown]
[im 31/61  bone]
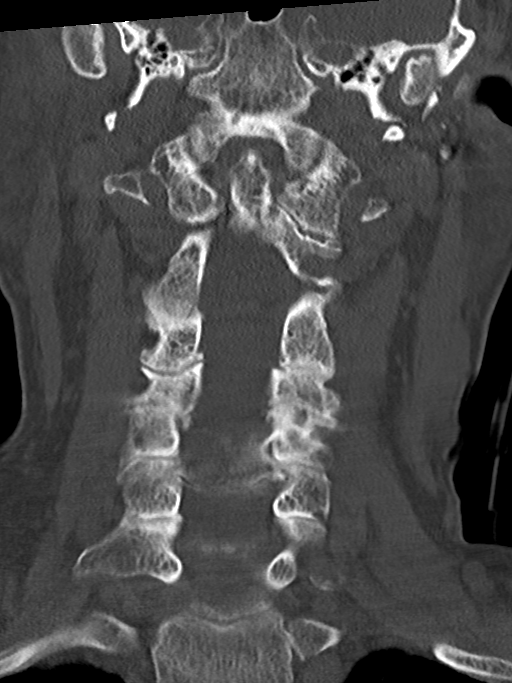

[Series 12: sagittal bone · sagittal · 0.28mm/px · 5 of 65 slices shown]
[im 11/65  bone]
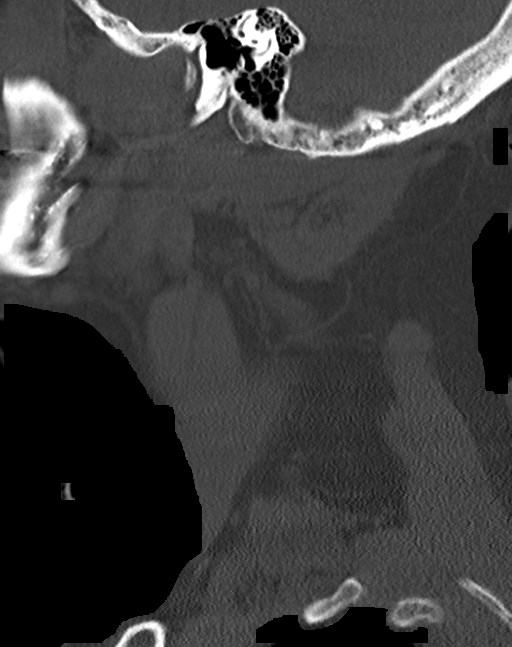
[im 22/65  bone]
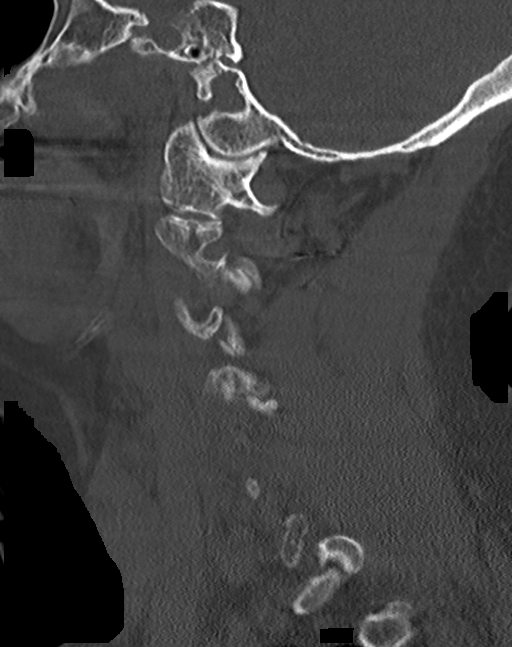
[im 33/65  bone]
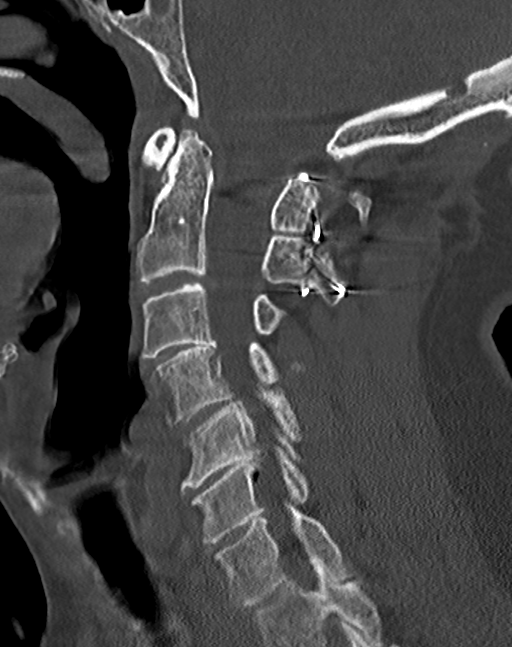
[im 43/65  bone]
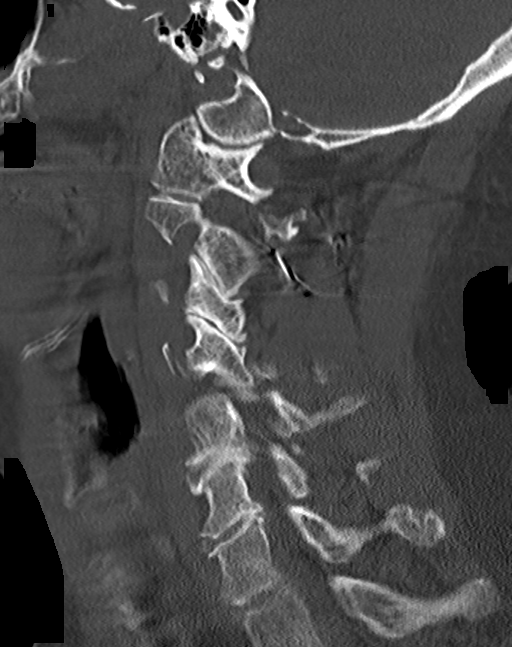
[im 54/65  bone]
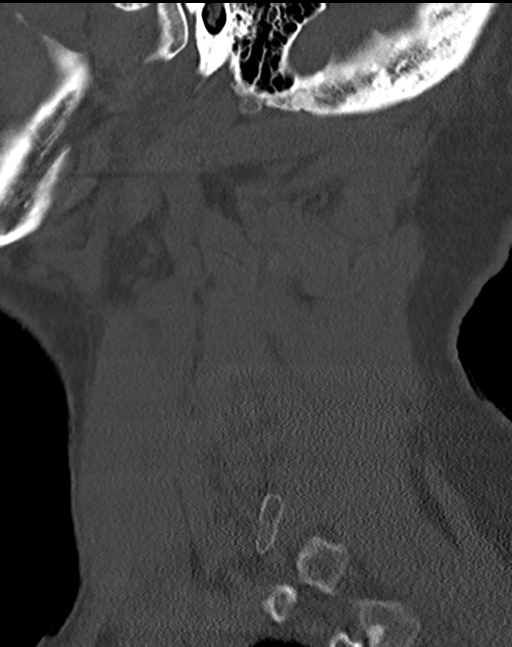

[Series 14: orthogonal axial st · axial · 0.21mm/px · z∈[-330,-277]mm · 2 of 77 slices shown]
[im 26/77  bone]
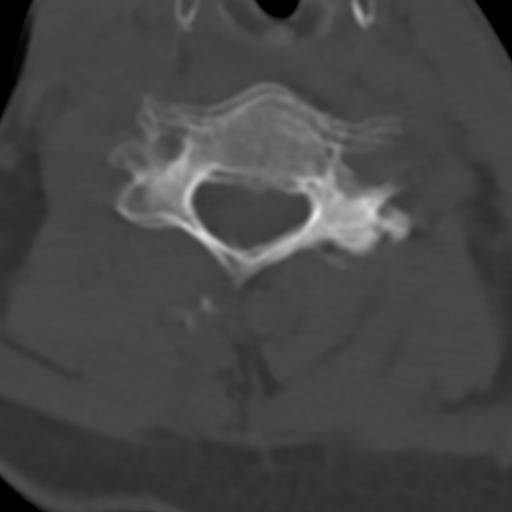
[im 51/77  bone]
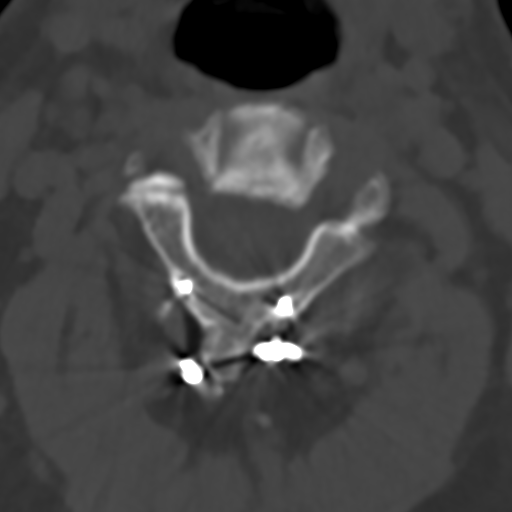

[12 of 33 positions shown; findings below may reference images not displayed]

FINDINGS: CT HEAD FINDINGS

Brain: Scattered motion artifacts. Normal ventricular morphology. No
midline shift or mass effect. Normal appearance of brain parenchyma.
No intracranial hemorrhage, mass lesion or evidence of acute
infarction. No extra-axial fluid collections.

Vascular: No hyperdense vessels.

Skull: Skull appears intact.

Sinuses/Orbits: Mild mucosal thickening in the maxillary sinuses.
Remaining paranasal sinuses and mastoid air cells clear.

Other: N/A

CT CERVICAL SPINE FINDINGS

Alignment: Exam significantly degraded by patient motion. Minimal
retrolisthesis at C5-C6. No additional gross cervical mild alignment
identified..

Skull base and vertebrae: Disc space narrowing at C5-C6 and C3-C4.
Endplate spur formation at C5-C6. Encroachment upon cervical neural
foramina bilaterally at C5-C6 by uncovertebral and facet
hypertrophy. Multilevel facet degenerative changes. Vertebral body
heights appear grossly maintained. No gross fracture is identified.
Prior posterior fusion of C1-C2. Uncovertebral spurs also encroach
upon the LEFT C3-C4 and RIGHT C6-C7 foramina, to lesser degrees at
additional levels.

Soft tissues and spinal canal: Prevertebral soft tissues grossly
normal thickness

Disc levels:  No additional gross abnormalities

Upper chest: Tips of lung apices clear.

Other: N/A
IMPRESSION: No acute intracranial abnormalities.

Multilevel degenerative disc and facet disease changes of the
cervical spine.

Postoperative changes of posterior fusion at C1-C2.

No gross acute cervical spine abnormalities are identified though
the exam is severely limited secondary to patient motion, unable to
remain still due to ongoing complaints of leg pain if patient has
neck symptoms consider repeat imaging once the patient is able to
fully cooperate with imaging.

## 2020-03-26 IMAGING — RF DG C-ARM 61-120 MIN
1 series · 4 of 4 positions shown · non-contrast
Comparison: Plain films left hip 12/26/2018.

CLINICAL DATA: The patient suffered a left intertrochanteric
fracture in a fall 12/26/2018. Intraoperative imaging for fracture
fixation. Initial encounter.

EXAM:
DG HIP (WITH OR WITHOUT PELVIS) 2-3V LEFT; DG C-ARM 61-120 MIN

[Series 1: run · 4 of 4 slices shown]
[im 1/4]
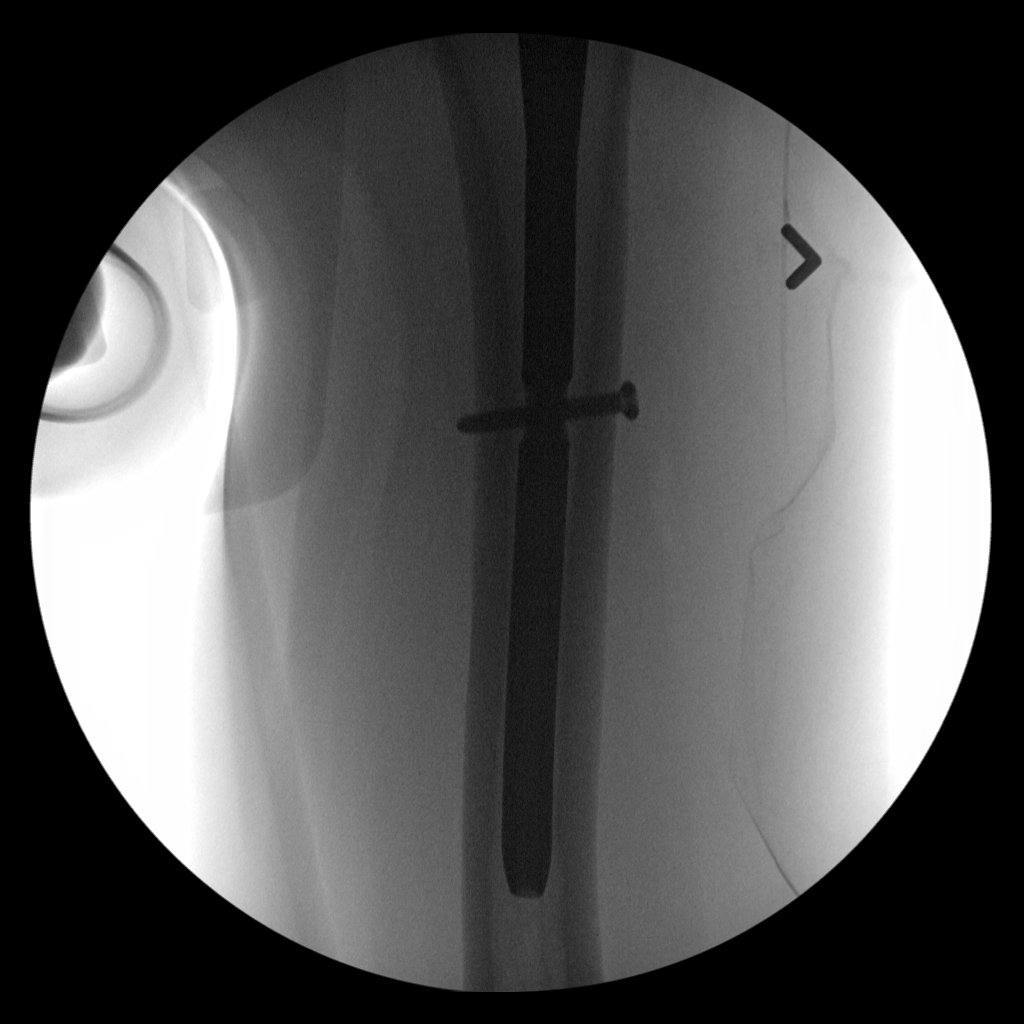
[im 2/4]
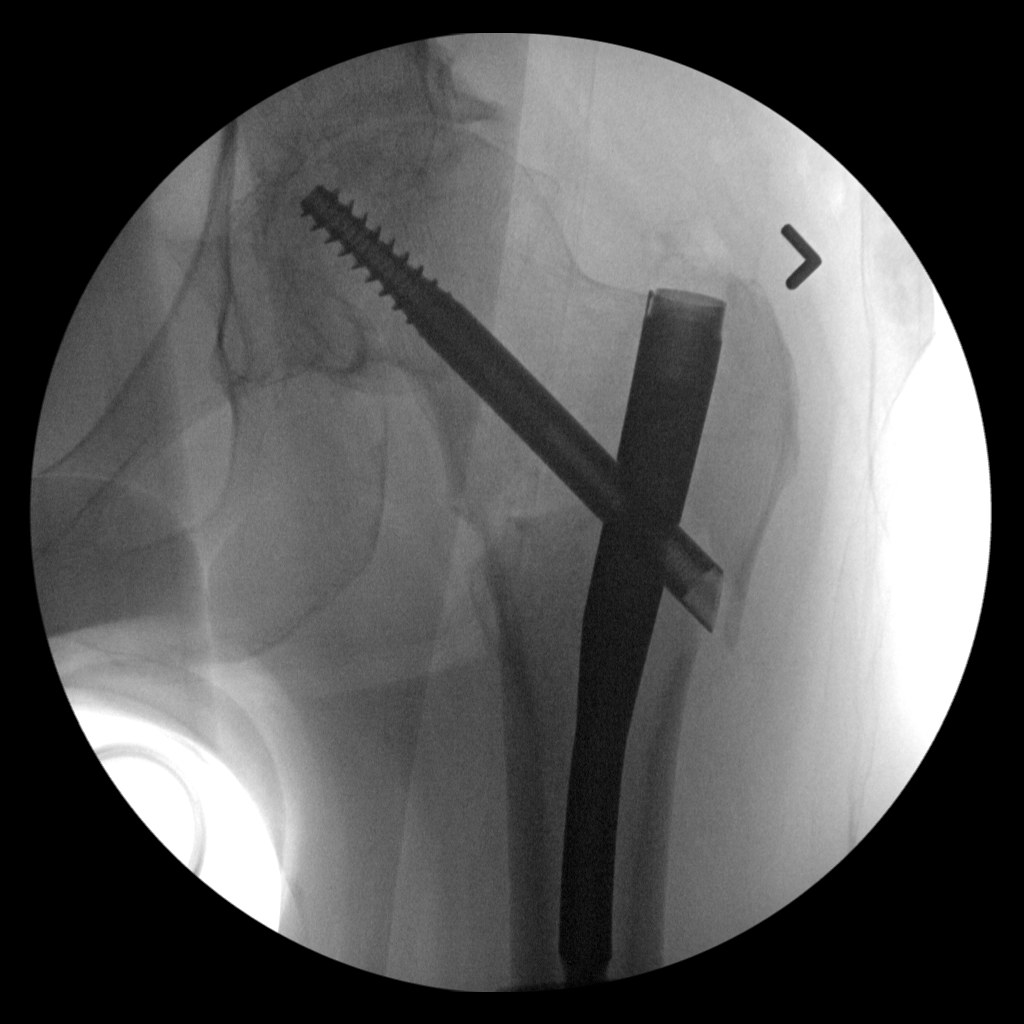
[im 3/4]
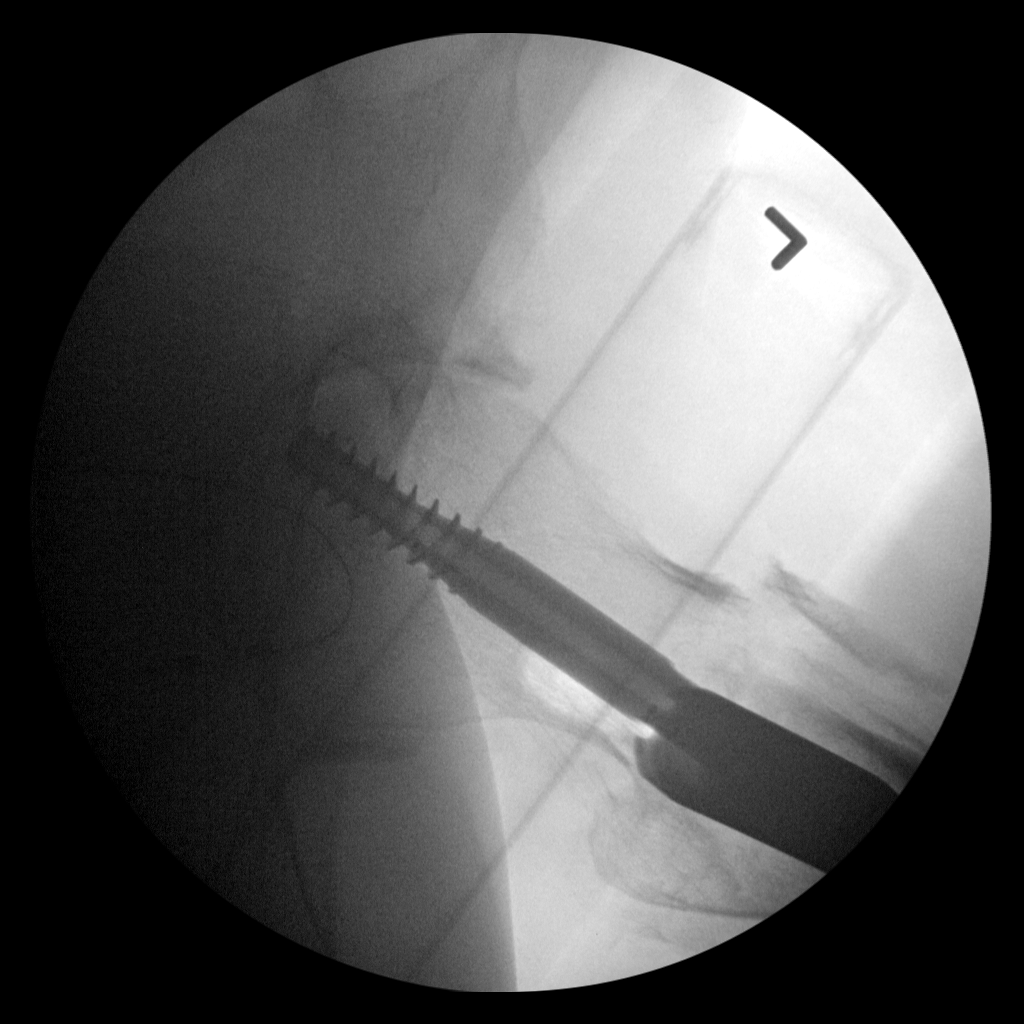
[im 4/4]
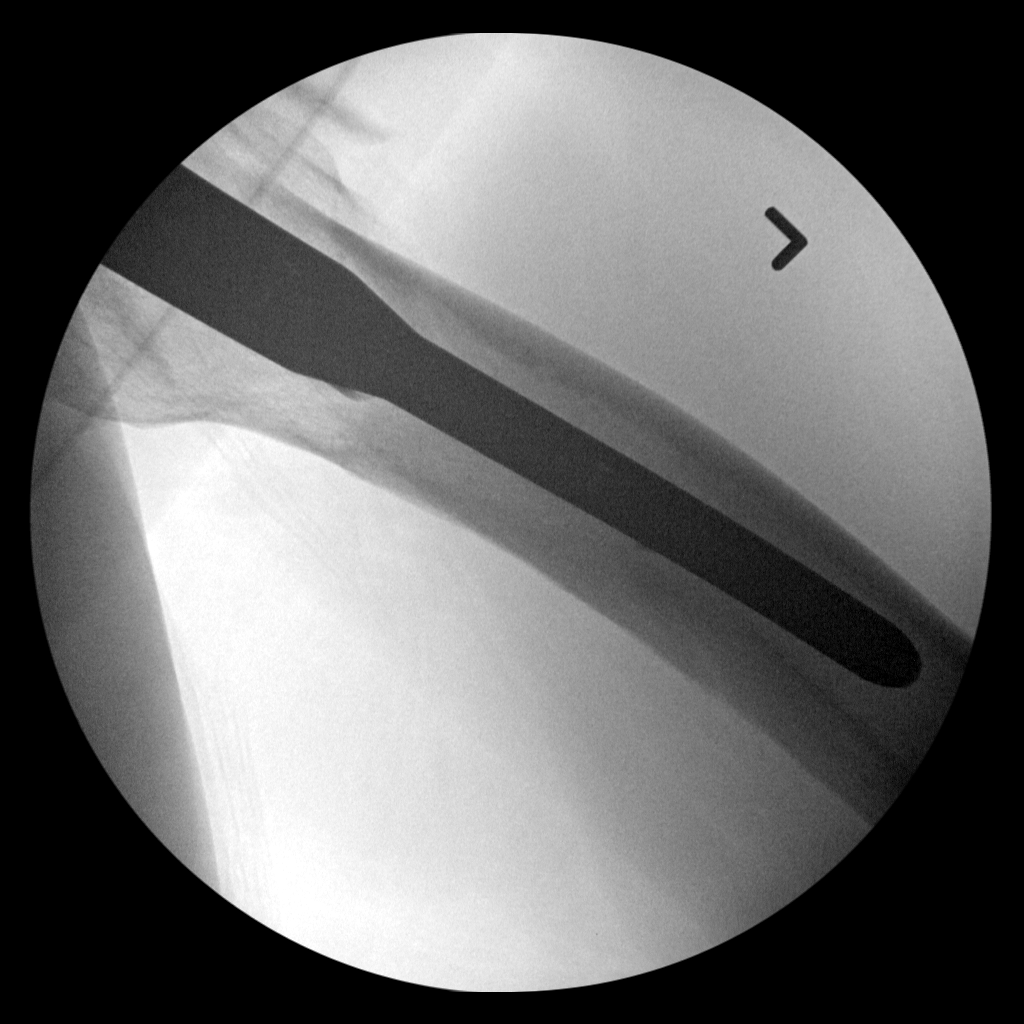

[4 of 4 positions shown; findings below may reference images not displayed]

FINDINGS: Four fluoroscopic intraoperative spot views demonstrate placement of
a compression hip screw and short intramedullary nail with a single
screw for fixation of an intertrochanteric fracture. Hardware is
intact. Position and alignment of the patient's fracture are
markedly improved. No acute abnormality.
IMPRESSION: Intraoperative imaging for fixation of a left intertrochanteric
fracture. No acute abnormality.

## 2020-04-19 DIAGNOSIS — M19072 Primary osteoarthritis, left ankle and foot: Secondary | ICD-10-CM | POA: Diagnosis not present

## 2020-04-19 DIAGNOSIS — M19071 Primary osteoarthritis, right ankle and foot: Secondary | ICD-10-CM | POA: Diagnosis not present

## 2020-04-19 DIAGNOSIS — M79671 Pain in right foot: Secondary | ICD-10-CM | POA: Diagnosis not present

## 2020-04-19 DIAGNOSIS — M79672 Pain in left foot: Secondary | ICD-10-CM | POA: Diagnosis not present

## 2020-04-27 DIAGNOSIS — M2042 Other hammer toe(s) (acquired), left foot: Secondary | ICD-10-CM | POA: Diagnosis not present

## 2020-04-27 DIAGNOSIS — M2142 Flat foot [pes planus] (acquired), left foot: Secondary | ICD-10-CM | POA: Diagnosis not present

## 2020-04-27 DIAGNOSIS — M24574 Contracture, right foot: Secondary | ICD-10-CM | POA: Diagnosis not present

## 2020-04-27 DIAGNOSIS — M6701 Short Achilles tendon (acquired), right ankle: Secondary | ICD-10-CM | POA: Diagnosis not present

## 2020-04-27 DIAGNOSIS — E039 Hypothyroidism, unspecified: Secondary | ICD-10-CM | POA: Diagnosis not present

## 2020-04-27 DIAGNOSIS — M2141 Flat foot [pes planus] (acquired), right foot: Secondary | ICD-10-CM | POA: Diagnosis not present

## 2020-04-27 DIAGNOSIS — M24575 Contracture, left foot: Secondary | ICD-10-CM | POA: Diagnosis not present

## 2020-04-27 DIAGNOSIS — R590 Localized enlarged lymph nodes: Secondary | ICD-10-CM | POA: Diagnosis not present

## 2020-04-27 DIAGNOSIS — M7742 Metatarsalgia, left foot: Secondary | ICD-10-CM | POA: Diagnosis not present

## 2020-04-27 DIAGNOSIS — M19071 Primary osteoarthritis, right ankle and foot: Secondary | ICD-10-CM | POA: Diagnosis not present

## 2020-04-27 DIAGNOSIS — I4821 Permanent atrial fibrillation: Secondary | ICD-10-CM | POA: Diagnosis not present

## 2020-04-27 DIAGNOSIS — M2041 Other hammer toe(s) (acquired), right foot: Secondary | ICD-10-CM | POA: Diagnosis not present

## 2020-04-27 DIAGNOSIS — M6702 Short Achilles tendon (acquired), left ankle: Secondary | ICD-10-CM | POA: Diagnosis not present

## 2020-04-27 DIAGNOSIS — I2510 Atherosclerotic heart disease of native coronary artery without angina pectoris: Secondary | ICD-10-CM | POA: Diagnosis not present

## 2020-04-27 DIAGNOSIS — M19072 Primary osteoarthritis, left ankle and foot: Secondary | ICD-10-CM | POA: Diagnosis not present

## 2020-04-27 DIAGNOSIS — M7741 Metatarsalgia, right foot: Secondary | ICD-10-CM | POA: Diagnosis not present

## 2020-05-10 DIAGNOSIS — Z125 Encounter for screening for malignant neoplasm of prostate: Secondary | ICD-10-CM | POA: Diagnosis not present

## 2020-05-10 DIAGNOSIS — R350 Frequency of micturition: Secondary | ICD-10-CM | POA: Diagnosis not present

## 2020-05-10 DIAGNOSIS — N400 Enlarged prostate without lower urinary tract symptoms: Secondary | ICD-10-CM | POA: Diagnosis not present

## 2020-05-27 DIAGNOSIS — M2142 Flat foot [pes planus] (acquired), left foot: Secondary | ICD-10-CM | POA: Diagnosis not present

## 2020-05-27 DIAGNOSIS — M6701 Short Achilles tendon (acquired), right ankle: Secondary | ICD-10-CM | POA: Diagnosis not present

## 2020-05-27 DIAGNOSIS — R590 Localized enlarged lymph nodes: Secondary | ICD-10-CM | POA: Diagnosis not present

## 2020-05-27 DIAGNOSIS — M2141 Flat foot [pes planus] (acquired), right foot: Secondary | ICD-10-CM | POA: Diagnosis not present

## 2020-05-27 DIAGNOSIS — M2042 Other hammer toe(s) (acquired), left foot: Secondary | ICD-10-CM | POA: Diagnosis not present

## 2020-05-27 DIAGNOSIS — M24575 Contracture, left foot: Secondary | ICD-10-CM | POA: Diagnosis not present

## 2020-05-27 DIAGNOSIS — I2510 Atherosclerotic heart disease of native coronary artery without angina pectoris: Secondary | ICD-10-CM | POA: Diagnosis not present

## 2020-05-27 DIAGNOSIS — M24574 Contracture, right foot: Secondary | ICD-10-CM | POA: Diagnosis not present

## 2020-05-27 DIAGNOSIS — E039 Hypothyroidism, unspecified: Secondary | ICD-10-CM | POA: Diagnosis not present

## 2020-05-27 DIAGNOSIS — M7742 Metatarsalgia, left foot: Secondary | ICD-10-CM | POA: Diagnosis not present

## 2020-05-27 DIAGNOSIS — M19071 Primary osteoarthritis, right ankle and foot: Secondary | ICD-10-CM | POA: Diagnosis not present

## 2020-05-27 DIAGNOSIS — M2041 Other hammer toe(s) (acquired), right foot: Secondary | ICD-10-CM | POA: Diagnosis not present

## 2020-05-27 DIAGNOSIS — M6702 Short Achilles tendon (acquired), left ankle: Secondary | ICD-10-CM | POA: Diagnosis not present

## 2020-05-27 DIAGNOSIS — M19072 Primary osteoarthritis, left ankle and foot: Secondary | ICD-10-CM | POA: Diagnosis not present

## 2020-05-27 DIAGNOSIS — I4821 Permanent atrial fibrillation: Secondary | ICD-10-CM | POA: Diagnosis not present

## 2020-05-27 DIAGNOSIS — M7741 Metatarsalgia, right foot: Secondary | ICD-10-CM | POA: Diagnosis not present

## 2020-05-28 DIAGNOSIS — R519 Headache, unspecified: Secondary | ICD-10-CM | POA: Diagnosis not present

## 2020-05-28 DIAGNOSIS — R52 Pain, unspecified: Secondary | ICD-10-CM | POA: Diagnosis not present

## 2020-05-28 DIAGNOSIS — R197 Diarrhea, unspecified: Secondary | ICD-10-CM | POA: Diagnosis not present

## 2020-05-28 DIAGNOSIS — Z20822 Contact with and (suspected) exposure to covid-19: Secondary | ICD-10-CM | POA: Diagnosis not present

## 2020-05-28 DIAGNOSIS — R509 Fever, unspecified: Secondary | ICD-10-CM | POA: Diagnosis not present

## 2020-05-28 DIAGNOSIS — R05 Cough: Secondary | ICD-10-CM | POA: Diagnosis not present

## 2020-05-28 DIAGNOSIS — I4891 Unspecified atrial fibrillation: Secondary | ICD-10-CM | POA: Diagnosis not present

## 2020-06-15 DIAGNOSIS — N410 Acute prostatitis: Secondary | ICD-10-CM | POA: Diagnosis not present

## 2020-06-21 DIAGNOSIS — M7742 Metatarsalgia, left foot: Secondary | ICD-10-CM | POA: Diagnosis not present

## 2020-06-21 DIAGNOSIS — M79671 Pain in right foot: Secondary | ICD-10-CM | POA: Diagnosis not present

## 2020-06-21 DIAGNOSIS — M2041 Other hammer toe(s) (acquired), right foot: Secondary | ICD-10-CM | POA: Diagnosis not present

## 2020-06-21 DIAGNOSIS — M7741 Metatarsalgia, right foot: Secondary | ICD-10-CM | POA: Diagnosis not present

## 2020-06-26 DIAGNOSIS — M6702 Short Achilles tendon (acquired), left ankle: Secondary | ICD-10-CM | POA: Diagnosis not present

## 2020-06-26 DIAGNOSIS — I2510 Atherosclerotic heart disease of native coronary artery without angina pectoris: Secondary | ICD-10-CM | POA: Diagnosis not present

## 2020-06-26 DIAGNOSIS — M2142 Flat foot [pes planus] (acquired), left foot: Secondary | ICD-10-CM | POA: Diagnosis not present

## 2020-06-26 DIAGNOSIS — M2141 Flat foot [pes planus] (acquired), right foot: Secondary | ICD-10-CM | POA: Diagnosis not present

## 2020-06-26 DIAGNOSIS — M6701 Short Achilles tendon (acquired), right ankle: Secondary | ICD-10-CM | POA: Diagnosis not present

## 2020-06-26 DIAGNOSIS — I4821 Permanent atrial fibrillation: Secondary | ICD-10-CM | POA: Diagnosis not present

## 2020-06-26 DIAGNOSIS — M19072 Primary osteoarthritis, left ankle and foot: Secondary | ICD-10-CM | POA: Diagnosis not present

## 2020-06-26 DIAGNOSIS — M24575 Contracture, left foot: Secondary | ICD-10-CM | POA: Diagnosis not present

## 2020-06-26 DIAGNOSIS — E039 Hypothyroidism, unspecified: Secondary | ICD-10-CM | POA: Diagnosis not present

## 2020-06-26 DIAGNOSIS — M2041 Other hammer toe(s) (acquired), right foot: Secondary | ICD-10-CM | POA: Diagnosis not present

## 2020-06-26 DIAGNOSIS — M7741 Metatarsalgia, right foot: Secondary | ICD-10-CM | POA: Diagnosis not present

## 2020-06-26 DIAGNOSIS — M2042 Other hammer toe(s) (acquired), left foot: Secondary | ICD-10-CM | POA: Diagnosis not present

## 2020-06-26 DIAGNOSIS — M19071 Primary osteoarthritis, right ankle and foot: Secondary | ICD-10-CM | POA: Diagnosis not present

## 2020-06-26 DIAGNOSIS — Z7982 Long term (current) use of aspirin: Secondary | ICD-10-CM | POA: Diagnosis not present

## 2020-06-26 DIAGNOSIS — M419 Scoliosis, unspecified: Secondary | ICD-10-CM | POA: Diagnosis not present

## 2020-06-26 DIAGNOSIS — M24574 Contracture, right foot: Secondary | ICD-10-CM | POA: Diagnosis not present

## 2020-07-19 DIAGNOSIS — Z0000 Encounter for general adult medical examination without abnormal findings: Secondary | ICD-10-CM | POA: Diagnosis not present

## 2020-07-19 DIAGNOSIS — Z1159 Encounter for screening for other viral diseases: Secondary | ICD-10-CM | POA: Diagnosis not present

## 2020-07-19 DIAGNOSIS — I4891 Unspecified atrial fibrillation: Secondary | ICD-10-CM | POA: Diagnosis not present

## 2020-07-19 DIAGNOSIS — F79 Unspecified intellectual disabilities: Secondary | ICD-10-CM | POA: Diagnosis not present

## 2020-07-19 DIAGNOSIS — R590 Localized enlarged lymph nodes: Secondary | ICD-10-CM | POA: Diagnosis not present

## 2020-07-26 DIAGNOSIS — E039 Hypothyroidism, unspecified: Secondary | ICD-10-CM | POA: Diagnosis not present

## 2020-07-26 DIAGNOSIS — M24574 Contracture, right foot: Secondary | ICD-10-CM | POA: Diagnosis not present

## 2020-07-26 DIAGNOSIS — Z7982 Long term (current) use of aspirin: Secondary | ICD-10-CM | POA: Diagnosis not present

## 2020-07-26 DIAGNOSIS — M19071 Primary osteoarthritis, right ankle and foot: Secondary | ICD-10-CM | POA: Diagnosis not present

## 2020-07-26 DIAGNOSIS — I4821 Permanent atrial fibrillation: Secondary | ICD-10-CM | POA: Diagnosis not present

## 2020-07-26 DIAGNOSIS — M2141 Flat foot [pes planus] (acquired), right foot: Secondary | ICD-10-CM | POA: Diagnosis not present

## 2020-07-26 DIAGNOSIS — M419 Scoliosis, unspecified: Secondary | ICD-10-CM | POA: Diagnosis not present

## 2020-07-26 DIAGNOSIS — M19072 Primary osteoarthritis, left ankle and foot: Secondary | ICD-10-CM | POA: Diagnosis not present

## 2020-07-26 DIAGNOSIS — M2042 Other hammer toe(s) (acquired), left foot: Secondary | ICD-10-CM | POA: Diagnosis not present

## 2020-07-26 DIAGNOSIS — M2041 Other hammer toe(s) (acquired), right foot: Secondary | ICD-10-CM | POA: Diagnosis not present

## 2020-07-26 DIAGNOSIS — M7741 Metatarsalgia, right foot: Secondary | ICD-10-CM | POA: Diagnosis not present

## 2020-07-26 DIAGNOSIS — M6701 Short Achilles tendon (acquired), right ankle: Secondary | ICD-10-CM | POA: Diagnosis not present

## 2020-07-26 DIAGNOSIS — M6702 Short Achilles tendon (acquired), left ankle: Secondary | ICD-10-CM | POA: Diagnosis not present

## 2020-07-26 DIAGNOSIS — M24575 Contracture, left foot: Secondary | ICD-10-CM | POA: Diagnosis not present

## 2020-07-26 DIAGNOSIS — I2510 Atherosclerotic heart disease of native coronary artery without angina pectoris: Secondary | ICD-10-CM | POA: Diagnosis not present

## 2020-07-26 DIAGNOSIS — M2142 Flat foot [pes planus] (acquired), left foot: Secondary | ICD-10-CM | POA: Diagnosis not present

## 2020-08-02 DIAGNOSIS — L821 Other seborrheic keratosis: Secondary | ICD-10-CM | POA: Diagnosis not present

## 2020-08-02 DIAGNOSIS — D225 Melanocytic nevi of trunk: Secondary | ICD-10-CM | POA: Diagnosis not present

## 2020-08-02 DIAGNOSIS — B351 Tinea unguium: Secondary | ICD-10-CM | POA: Diagnosis not present

## 2020-08-02 DIAGNOSIS — R351 Nocturia: Secondary | ICD-10-CM | POA: Diagnosis not present

## 2020-08-02 DIAGNOSIS — R3912 Poor urinary stream: Secondary | ICD-10-CM | POA: Diagnosis not present

## 2020-08-02 DIAGNOSIS — N401 Enlarged prostate with lower urinary tract symptoms: Secondary | ICD-10-CM | POA: Diagnosis not present

## 2020-08-02 DIAGNOSIS — R350 Frequency of micturition: Secondary | ICD-10-CM | POA: Diagnosis not present

## 2020-08-02 DIAGNOSIS — L814 Other melanin hyperpigmentation: Secondary | ICD-10-CM | POA: Diagnosis not present

## 2020-08-09 DIAGNOSIS — R269 Unspecified abnormalities of gait and mobility: Secondary | ICD-10-CM | POA: Diagnosis not present

## 2020-08-09 DIAGNOSIS — M25552 Pain in left hip: Secondary | ICD-10-CM | POA: Diagnosis not present

## 2020-08-16 DIAGNOSIS — M7742 Metatarsalgia, left foot: Secondary | ICD-10-CM | POA: Diagnosis not present

## 2020-08-16 DIAGNOSIS — M2041 Other hammer toe(s) (acquired), right foot: Secondary | ICD-10-CM | POA: Diagnosis not present

## 2020-08-16 DIAGNOSIS — M7741 Metatarsalgia, right foot: Secondary | ICD-10-CM | POA: Diagnosis not present

## 2020-08-16 DIAGNOSIS — M79671 Pain in right foot: Secondary | ICD-10-CM | POA: Diagnosis not present

## 2020-09-13 DIAGNOSIS — Z20828 Contact with and (suspected) exposure to other viral communicable diseases: Secondary | ICD-10-CM | POA: Diagnosis not present

## 2020-10-06 DIAGNOSIS — M19071 Primary osteoarthritis, right ankle and foot: Secondary | ICD-10-CM | POA: Diagnosis not present

## 2020-10-06 DIAGNOSIS — I4892 Unspecified atrial flutter: Secondary | ICD-10-CM | POA: Diagnosis not present

## 2020-10-06 DIAGNOSIS — I10 Essential (primary) hypertension: Secondary | ICD-10-CM | POA: Diagnosis not present

## 2020-10-06 DIAGNOSIS — M19072 Primary osteoarthritis, left ankle and foot: Secondary | ICD-10-CM | POA: Diagnosis not present

## 2020-10-06 DIAGNOSIS — I4891 Unspecified atrial fibrillation: Secondary | ICD-10-CM | POA: Diagnosis not present

## 2020-10-06 DIAGNOSIS — F840 Autistic disorder: Secondary | ICD-10-CM | POA: Diagnosis not present

## 2020-10-06 DIAGNOSIS — E049 Nontoxic goiter, unspecified: Secondary | ICD-10-CM | POA: Diagnosis not present

## 2020-10-06 DIAGNOSIS — R6250 Unspecified lack of expected normal physiological development in childhood: Secondary | ICD-10-CM | POA: Diagnosis not present

## 2020-10-18 DIAGNOSIS — M2170 Unequal limb length (acquired), unspecified site: Secondary | ICD-10-CM | POA: Diagnosis not present

## 2020-10-18 DIAGNOSIS — M7741 Metatarsalgia, right foot: Secondary | ICD-10-CM | POA: Diagnosis not present

## 2020-10-18 DIAGNOSIS — M2041 Other hammer toe(s) (acquired), right foot: Secondary | ICD-10-CM | POA: Diagnosis not present

## 2020-10-18 DIAGNOSIS — R2689 Other abnormalities of gait and mobility: Secondary | ICD-10-CM | POA: Diagnosis not present

## 2020-11-01 DIAGNOSIS — R269 Unspecified abnormalities of gait and mobility: Secondary | ICD-10-CM | POA: Diagnosis not present

## 2020-11-01 DIAGNOSIS — M25552 Pain in left hip: Secondary | ICD-10-CM | POA: Diagnosis not present

## 2020-11-05 DIAGNOSIS — E049 Nontoxic goiter, unspecified: Secondary | ICD-10-CM | POA: Diagnosis not present

## 2020-11-05 DIAGNOSIS — I10 Essential (primary) hypertension: Secondary | ICD-10-CM | POA: Diagnosis not present

## 2020-11-05 DIAGNOSIS — M19072 Primary osteoarthritis, left ankle and foot: Secondary | ICD-10-CM | POA: Diagnosis not present

## 2020-11-05 DIAGNOSIS — R6250 Unspecified lack of expected normal physiological development in childhood: Secondary | ICD-10-CM | POA: Diagnosis not present

## 2020-11-05 DIAGNOSIS — F840 Autistic disorder: Secondary | ICD-10-CM | POA: Diagnosis not present

## 2020-11-05 DIAGNOSIS — I4891 Unspecified atrial fibrillation: Secondary | ICD-10-CM | POA: Diagnosis not present

## 2020-11-05 DIAGNOSIS — M19071 Primary osteoarthritis, right ankle and foot: Secondary | ICD-10-CM | POA: Diagnosis not present

## 2020-11-05 DIAGNOSIS — I4892 Unspecified atrial flutter: Secondary | ICD-10-CM | POA: Diagnosis not present

## 2020-11-06 ENCOUNTER — Other Ambulatory Visit: Payer: Medicare Other

## 2020-12-05 DIAGNOSIS — M25552 Pain in left hip: Secondary | ICD-10-CM | POA: Diagnosis not present

## 2020-12-05 DIAGNOSIS — I10 Essential (primary) hypertension: Secondary | ICD-10-CM | POA: Diagnosis not present

## 2020-12-05 DIAGNOSIS — F840 Autistic disorder: Secondary | ICD-10-CM | POA: Diagnosis not present

## 2020-12-05 DIAGNOSIS — E049 Nontoxic goiter, unspecified: Secondary | ICD-10-CM | POA: Diagnosis not present

## 2020-12-05 DIAGNOSIS — M19071 Primary osteoarthritis, right ankle and foot: Secondary | ICD-10-CM | POA: Diagnosis not present

## 2020-12-05 DIAGNOSIS — I4892 Unspecified atrial flutter: Secondary | ICD-10-CM | POA: Diagnosis not present

## 2020-12-05 DIAGNOSIS — M19072 Primary osteoarthritis, left ankle and foot: Secondary | ICD-10-CM | POA: Diagnosis not present

## 2020-12-05 DIAGNOSIS — I4891 Unspecified atrial fibrillation: Secondary | ICD-10-CM | POA: Diagnosis not present

## 2020-12-23 DIAGNOSIS — R059 Cough, unspecified: Secondary | ICD-10-CM | POA: Diagnosis not present

## 2020-12-23 DIAGNOSIS — U071 COVID-19: Secondary | ICD-10-CM | POA: Diagnosis not present

## 2020-12-25 DIAGNOSIS — Z23 Encounter for immunization: Secondary | ICD-10-CM | POA: Diagnosis not present

## 2020-12-25 DIAGNOSIS — U071 COVID-19: Secondary | ICD-10-CM | POA: Diagnosis not present

## 2021-01-04 DIAGNOSIS — I10 Essential (primary) hypertension: Secondary | ICD-10-CM | POA: Diagnosis not present

## 2021-01-04 DIAGNOSIS — F840 Autistic disorder: Secondary | ICD-10-CM | POA: Diagnosis not present

## 2021-01-04 DIAGNOSIS — M19072 Primary osteoarthritis, left ankle and foot: Secondary | ICD-10-CM | POA: Diagnosis not present

## 2021-01-04 DIAGNOSIS — I4892 Unspecified atrial flutter: Secondary | ICD-10-CM | POA: Diagnosis not present

## 2021-01-04 DIAGNOSIS — M19071 Primary osteoarthritis, right ankle and foot: Secondary | ICD-10-CM | POA: Diagnosis not present

## 2021-01-04 DIAGNOSIS — I4891 Unspecified atrial fibrillation: Secondary | ICD-10-CM | POA: Diagnosis not present

## 2021-01-04 DIAGNOSIS — E049 Nontoxic goiter, unspecified: Secondary | ICD-10-CM | POA: Diagnosis not present

## 2021-01-04 DIAGNOSIS — M25552 Pain in left hip: Secondary | ICD-10-CM | POA: Diagnosis not present

## 2021-01-24 DIAGNOSIS — W010XXA Fall on same level from slipping, tripping and stumbling without subsequent striking against object, initial encounter: Secondary | ICD-10-CM | POA: Diagnosis not present

## 2021-01-24 DIAGNOSIS — F89 Unspecified disorder of psychological development: Secondary | ICD-10-CM | POA: Diagnosis not present

## 2021-01-24 DIAGNOSIS — M7989 Other specified soft tissue disorders: Secondary | ICD-10-CM | POA: Diagnosis not present

## 2021-01-24 DIAGNOSIS — Y999 Unspecified external cause status: Secondary | ICD-10-CM | POA: Diagnosis not present

## 2021-01-24 DIAGNOSIS — M25552 Pain in left hip: Secondary | ICD-10-CM | POA: Diagnosis not present

## 2021-01-24 DIAGNOSIS — S82832A Other fracture of upper and lower end of left fibula, initial encounter for closed fracture: Secondary | ICD-10-CM | POA: Diagnosis not present

## 2021-01-24 DIAGNOSIS — S82445A Nondisplaced spiral fracture of shaft of left fibula, initial encounter for closed fracture: Secondary | ICD-10-CM | POA: Diagnosis not present

## 2021-01-24 DIAGNOSIS — M25562 Pain in left knee: Secondary | ICD-10-CM | POA: Diagnosis not present

## 2021-01-25 DIAGNOSIS — S82432A Displaced oblique fracture of shaft of left fibula, initial encounter for closed fracture: Secondary | ICD-10-CM | POA: Diagnosis not present

## 2021-01-25 DIAGNOSIS — S82432D Displaced oblique fracture of shaft of left fibula, subsequent encounter for closed fracture with routine healing: Secondary | ICD-10-CM | POA: Diagnosis not present

## 2021-01-25 DIAGNOSIS — Z4789 Encounter for other orthopedic aftercare: Secondary | ICD-10-CM | POA: Diagnosis not present

## 2021-01-25 DIAGNOSIS — M25572 Pain in left ankle and joints of left foot: Secondary | ICD-10-CM | POA: Diagnosis not present

## 2021-02-02 DIAGNOSIS — R2242 Localized swelling, mass and lump, left lower limb: Secondary | ICD-10-CM | POA: Diagnosis not present

## 2021-02-03 DIAGNOSIS — Z7982 Long term (current) use of aspirin: Secondary | ICD-10-CM | POA: Diagnosis not present

## 2021-02-03 DIAGNOSIS — M19072 Primary osteoarthritis, left ankle and foot: Secondary | ICD-10-CM | POA: Diagnosis not present

## 2021-02-03 DIAGNOSIS — F840 Autistic disorder: Secondary | ICD-10-CM | POA: Diagnosis not present

## 2021-02-03 DIAGNOSIS — E049 Nontoxic goiter, unspecified: Secondary | ICD-10-CM | POA: Diagnosis not present

## 2021-02-03 DIAGNOSIS — I4892 Unspecified atrial flutter: Secondary | ICD-10-CM | POA: Diagnosis not present

## 2021-02-03 DIAGNOSIS — I4891 Unspecified atrial fibrillation: Secondary | ICD-10-CM | POA: Diagnosis not present

## 2021-02-03 DIAGNOSIS — I10 Essential (primary) hypertension: Secondary | ICD-10-CM | POA: Diagnosis not present

## 2021-02-03 DIAGNOSIS — S82892D Other fracture of left lower leg, subsequent encounter for closed fracture with routine healing: Secondary | ICD-10-CM | POA: Diagnosis not present

## 2021-02-03 DIAGNOSIS — M19071 Primary osteoarthritis, right ankle and foot: Secondary | ICD-10-CM | POA: Diagnosis not present

## 2021-02-03 DIAGNOSIS — D649 Anemia, unspecified: Secondary | ICD-10-CM | POA: Diagnosis not present

## 2021-02-03 DIAGNOSIS — M25552 Pain in left hip: Secondary | ICD-10-CM | POA: Diagnosis not present

## 2021-02-09 DIAGNOSIS — R2242 Localized swelling, mass and lump, left lower limb: Secondary | ICD-10-CM | POA: Diagnosis not present

## 2021-02-13 DIAGNOSIS — S82452A Displaced comminuted fracture of shaft of left fibula, initial encounter for closed fracture: Secondary | ICD-10-CM | POA: Diagnosis not present

## 2021-03-05 DIAGNOSIS — I10 Essential (primary) hypertension: Secondary | ICD-10-CM | POA: Diagnosis not present

## 2021-03-05 DIAGNOSIS — Z7982 Long term (current) use of aspirin: Secondary | ICD-10-CM | POA: Diagnosis not present

## 2021-03-05 DIAGNOSIS — M19072 Primary osteoarthritis, left ankle and foot: Secondary | ICD-10-CM | POA: Diagnosis not present

## 2021-03-05 DIAGNOSIS — F840 Autistic disorder: Secondary | ICD-10-CM | POA: Diagnosis not present

## 2021-03-05 DIAGNOSIS — S82892D Other fracture of left lower leg, subsequent encounter for closed fracture with routine healing: Secondary | ICD-10-CM | POA: Diagnosis not present

## 2021-03-05 DIAGNOSIS — M25552 Pain in left hip: Secondary | ICD-10-CM | POA: Diagnosis not present

## 2021-03-05 DIAGNOSIS — M19071 Primary osteoarthritis, right ankle and foot: Secondary | ICD-10-CM | POA: Diagnosis not present

## 2021-03-05 DIAGNOSIS — I4891 Unspecified atrial fibrillation: Secondary | ICD-10-CM | POA: Diagnosis not present

## 2021-03-05 DIAGNOSIS — I4892 Unspecified atrial flutter: Secondary | ICD-10-CM | POA: Diagnosis not present

## 2021-03-05 DIAGNOSIS — D649 Anemia, unspecified: Secondary | ICD-10-CM | POA: Diagnosis not present

## 2021-03-05 DIAGNOSIS — E049 Nontoxic goiter, unspecified: Secondary | ICD-10-CM | POA: Diagnosis not present

## 2021-03-14 DIAGNOSIS — M2170 Unequal limb length (acquired), unspecified site: Secondary | ICD-10-CM | POA: Diagnosis not present

## 2021-03-14 DIAGNOSIS — H524 Presbyopia: Secondary | ICD-10-CM | POA: Diagnosis not present

## 2021-03-14 DIAGNOSIS — S8265XD Nondisplaced fracture of lateral malleolus of left fibula, subsequent encounter for closed fracture with routine healing: Secondary | ICD-10-CM | POA: Diagnosis not present

## 2021-03-14 DIAGNOSIS — M2041 Other hammer toe(s) (acquired), right foot: Secondary | ICD-10-CM | POA: Diagnosis not present

## 2021-03-14 DIAGNOSIS — H2513 Age-related nuclear cataract, bilateral: Secondary | ICD-10-CM | POA: Diagnosis not present

## 2021-03-14 DIAGNOSIS — H40013 Open angle with borderline findings, low risk, bilateral: Secondary | ICD-10-CM | POA: Diagnosis not present

## 2021-03-14 DIAGNOSIS — H25013 Cortical age-related cataract, bilateral: Secondary | ICD-10-CM | POA: Diagnosis not present

## 2021-03-14 DIAGNOSIS — R2689 Other abnormalities of gait and mobility: Secondary | ICD-10-CM | POA: Diagnosis not present

## 2021-03-15 DIAGNOSIS — S82892S Other fracture of left lower leg, sequela: Secondary | ICD-10-CM | POA: Diagnosis not present

## 2021-03-15 DIAGNOSIS — S82432A Displaced oblique fracture of shaft of left fibula, initial encounter for closed fracture: Secondary | ICD-10-CM | POA: Diagnosis not present

## 2021-03-15 DIAGNOSIS — S82832D Other fracture of upper and lower end of left fibula, subsequent encounter for closed fracture with routine healing: Secondary | ICD-10-CM | POA: Diagnosis not present

## 2021-04-04 DIAGNOSIS — M19072 Primary osteoarthritis, left ankle and foot: Secondary | ICD-10-CM | POA: Diagnosis not present

## 2021-04-04 DIAGNOSIS — M25552 Pain in left hip: Secondary | ICD-10-CM | POA: Diagnosis not present

## 2021-04-04 DIAGNOSIS — M19071 Primary osteoarthritis, right ankle and foot: Secondary | ICD-10-CM | POA: Diagnosis not present

## 2021-04-04 DIAGNOSIS — S82892D Other fracture of left lower leg, subsequent encounter for closed fracture with routine healing: Secondary | ICD-10-CM | POA: Diagnosis not present

## 2021-04-04 DIAGNOSIS — I4891 Unspecified atrial fibrillation: Secondary | ICD-10-CM | POA: Diagnosis not present

## 2021-04-04 DIAGNOSIS — Z7982 Long term (current) use of aspirin: Secondary | ICD-10-CM | POA: Diagnosis not present

## 2021-04-04 DIAGNOSIS — D649 Anemia, unspecified: Secondary | ICD-10-CM | POA: Diagnosis not present

## 2021-04-04 DIAGNOSIS — I4892 Unspecified atrial flutter: Secondary | ICD-10-CM | POA: Diagnosis not present

## 2021-04-04 DIAGNOSIS — I10 Essential (primary) hypertension: Secondary | ICD-10-CM | POA: Diagnosis not present

## 2021-04-04 DIAGNOSIS — F840 Autistic disorder: Secondary | ICD-10-CM | POA: Diagnosis not present

## 2021-04-04 DIAGNOSIS — E049 Nontoxic goiter, unspecified: Secondary | ICD-10-CM | POA: Diagnosis not present

## 2021-04-11 DIAGNOSIS — I4811 Longstanding persistent atrial fibrillation: Secondary | ICD-10-CM | POA: Diagnosis not present

## 2021-04-11 DIAGNOSIS — E039 Hypothyroidism, unspecified: Secondary | ICD-10-CM | POA: Diagnosis not present

## 2021-04-11 DIAGNOSIS — E559 Vitamin D deficiency, unspecified: Secondary | ICD-10-CM | POA: Diagnosis not present

## 2021-04-11 DIAGNOSIS — E7800 Pure hypercholesterolemia, unspecified: Secondary | ICD-10-CM | POA: Diagnosis not present

## 2021-04-12 DIAGNOSIS — I4811 Longstanding persistent atrial fibrillation: Secondary | ICD-10-CM | POA: Diagnosis not present

## 2021-04-12 DIAGNOSIS — E7800 Pure hypercholesterolemia, unspecified: Secondary | ICD-10-CM | POA: Diagnosis not present

## 2021-04-12 DIAGNOSIS — E559 Vitamin D deficiency, unspecified: Secondary | ICD-10-CM | POA: Diagnosis not present

## 2021-04-12 DIAGNOSIS — E039 Hypothyroidism, unspecified: Secondary | ICD-10-CM | POA: Diagnosis not present

## 2021-05-03 DIAGNOSIS — M7752 Other enthesopathy of left foot: Secondary | ICD-10-CM | POA: Diagnosis not present

## 2021-05-03 DIAGNOSIS — S82442D Displaced spiral fracture of shaft of left fibula, subsequent encounter for closed fracture with routine healing: Secondary | ICD-10-CM | POA: Diagnosis not present

## 2021-05-03 DIAGNOSIS — M19072 Primary osteoarthritis, left ankle and foot: Secondary | ICD-10-CM | POA: Diagnosis not present

## 2021-05-03 DIAGNOSIS — S82892D Other fracture of left lower leg, subsequent encounter for closed fracture with routine healing: Secondary | ICD-10-CM | POA: Diagnosis not present

## 2021-05-03 DIAGNOSIS — M722 Plantar fascial fibromatosis: Secondary | ICD-10-CM | POA: Diagnosis not present

## 2021-05-03 DIAGNOSIS — Z888 Allergy status to other drugs, medicaments and biological substances status: Secondary | ICD-10-CM | POA: Diagnosis not present

## 2021-05-03 DIAGNOSIS — S82892S Other fracture of left lower leg, sequela: Secondary | ICD-10-CM | POA: Diagnosis not present

## 2021-05-04 DIAGNOSIS — I4891 Unspecified atrial fibrillation: Secondary | ICD-10-CM | POA: Diagnosis not present

## 2021-05-04 DIAGNOSIS — E049 Nontoxic goiter, unspecified: Secondary | ICD-10-CM | POA: Diagnosis not present

## 2021-05-04 DIAGNOSIS — I4892 Unspecified atrial flutter: Secondary | ICD-10-CM | POA: Diagnosis not present

## 2021-05-04 DIAGNOSIS — Z7982 Long term (current) use of aspirin: Secondary | ICD-10-CM | POA: Diagnosis not present

## 2021-05-04 DIAGNOSIS — F840 Autistic disorder: Secondary | ICD-10-CM | POA: Diagnosis not present

## 2021-05-04 DIAGNOSIS — M19072 Primary osteoarthritis, left ankle and foot: Secondary | ICD-10-CM | POA: Diagnosis not present

## 2021-05-04 DIAGNOSIS — I10 Essential (primary) hypertension: Secondary | ICD-10-CM | POA: Diagnosis not present

## 2021-05-04 DIAGNOSIS — M25552 Pain in left hip: Secondary | ICD-10-CM | POA: Diagnosis not present

## 2021-05-04 DIAGNOSIS — M19071 Primary osteoarthritis, right ankle and foot: Secondary | ICD-10-CM | POA: Diagnosis not present

## 2021-05-04 DIAGNOSIS — S82892D Other fracture of left lower leg, subsequent encounter for closed fracture with routine healing: Secondary | ICD-10-CM | POA: Diagnosis not present

## 2021-05-04 DIAGNOSIS — D649 Anemia, unspecified: Secondary | ICD-10-CM | POA: Diagnosis not present

## 2021-05-30 DIAGNOSIS — M2170 Unequal limb length (acquired), unspecified site: Secondary | ICD-10-CM | POA: Diagnosis not present

## 2021-05-30 DIAGNOSIS — S8265XD Nondisplaced fracture of lateral malleolus of left fibula, subsequent encounter for closed fracture with routine healing: Secondary | ICD-10-CM | POA: Diagnosis not present

## 2021-05-30 DIAGNOSIS — R2689 Other abnormalities of gait and mobility: Secondary | ICD-10-CM | POA: Diagnosis not present

## 2021-05-30 DIAGNOSIS — M2041 Other hammer toe(s) (acquired), right foot: Secondary | ICD-10-CM | POA: Diagnosis not present

## 2021-06-28 DIAGNOSIS — S82892D Other fracture of left lower leg, subsequent encounter for closed fracture with routine healing: Secondary | ICD-10-CM | POA: Diagnosis not present

## 2021-06-28 DIAGNOSIS — S82442D Displaced spiral fracture of shaft of left fibula, subsequent encounter for closed fracture with routine healing: Secondary | ICD-10-CM | POA: Diagnosis not present

## 2021-06-28 DIAGNOSIS — M7731 Calcaneal spur, right foot: Secondary | ICD-10-CM | POA: Diagnosis not present

## 2021-06-28 DIAGNOSIS — M19072 Primary osteoarthritis, left ankle and foot: Secondary | ICD-10-CM | POA: Diagnosis not present

## 2021-07-12 DIAGNOSIS — Z7409 Other reduced mobility: Secondary | ICD-10-CM | POA: Diagnosis not present

## 2021-07-12 DIAGNOSIS — Z789 Other specified health status: Secondary | ICD-10-CM | POA: Diagnosis not present

## 2021-07-12 DIAGNOSIS — R262 Difficulty in walking, not elsewhere classified: Secondary | ICD-10-CM | POA: Diagnosis not present

## 2021-07-12 DIAGNOSIS — S82892S Other fracture of left lower leg, sequela: Secondary | ICD-10-CM | POA: Diagnosis not present

## 2021-07-17 DIAGNOSIS — E039 Hypothyroidism, unspecified: Secondary | ICD-10-CM | POA: Diagnosis not present

## 2021-07-18 DIAGNOSIS — N400 Enlarged prostate without lower urinary tract symptoms: Secondary | ICD-10-CM | POA: Diagnosis not present

## 2021-08-08 DIAGNOSIS — S82892S Other fracture of left lower leg, sequela: Secondary | ICD-10-CM | POA: Diagnosis not present

## 2021-08-08 DIAGNOSIS — Z7409 Other reduced mobility: Secondary | ICD-10-CM | POA: Diagnosis not present

## 2021-08-08 DIAGNOSIS — Z789 Other specified health status: Secondary | ICD-10-CM | POA: Diagnosis not present

## 2021-08-08 DIAGNOSIS — R262 Difficulty in walking, not elsewhere classified: Secondary | ICD-10-CM | POA: Diagnosis not present

## 2021-08-15 DIAGNOSIS — R262 Difficulty in walking, not elsewhere classified: Secondary | ICD-10-CM | POA: Diagnosis not present

## 2021-08-15 DIAGNOSIS — Z789 Other specified health status: Secondary | ICD-10-CM | POA: Diagnosis not present

## 2021-08-15 DIAGNOSIS — Z7409 Other reduced mobility: Secondary | ICD-10-CM | POA: Diagnosis not present

## 2021-08-15 DIAGNOSIS — S82892S Other fracture of left lower leg, sequela: Secondary | ICD-10-CM | POA: Diagnosis not present

## 2021-08-22 DIAGNOSIS — R262 Difficulty in walking, not elsewhere classified: Secondary | ICD-10-CM | POA: Diagnosis not present

## 2021-08-22 DIAGNOSIS — Z7409 Other reduced mobility: Secondary | ICD-10-CM | POA: Diagnosis not present

## 2021-08-22 DIAGNOSIS — S82892S Other fracture of left lower leg, sequela: Secondary | ICD-10-CM | POA: Diagnosis not present

## 2021-08-22 DIAGNOSIS — Z789 Other specified health status: Secondary | ICD-10-CM | POA: Diagnosis not present

## 2021-09-12 DIAGNOSIS — L814 Other melanin hyperpigmentation: Secondary | ICD-10-CM | POA: Diagnosis not present

## 2021-09-12 DIAGNOSIS — D225 Melanocytic nevi of trunk: Secondary | ICD-10-CM | POA: Diagnosis not present

## 2021-09-12 DIAGNOSIS — L0292 Furuncle, unspecified: Secondary | ICD-10-CM | POA: Diagnosis not present

## 2021-09-12 DIAGNOSIS — L821 Other seborrheic keratosis: Secondary | ICD-10-CM | POA: Diagnosis not present

## 2021-09-12 DIAGNOSIS — S82892S Other fracture of left lower leg, sequela: Secondary | ICD-10-CM | POA: Diagnosis not present

## 2021-09-12 DIAGNOSIS — R262 Difficulty in walking, not elsewhere classified: Secondary | ICD-10-CM | POA: Diagnosis not present

## 2021-09-12 DIAGNOSIS — Z7409 Other reduced mobility: Secondary | ICD-10-CM | POA: Diagnosis not present

## 2021-09-12 DIAGNOSIS — Z789 Other specified health status: Secondary | ICD-10-CM | POA: Diagnosis not present

## 2021-09-19 DIAGNOSIS — S82892S Other fracture of left lower leg, sequela: Secondary | ICD-10-CM | POA: Diagnosis not present

## 2021-09-19 DIAGNOSIS — R262 Difficulty in walking, not elsewhere classified: Secondary | ICD-10-CM | POA: Diagnosis not present

## 2021-09-19 DIAGNOSIS — Z7409 Other reduced mobility: Secondary | ICD-10-CM | POA: Diagnosis not present

## 2021-09-19 DIAGNOSIS — Z789 Other specified health status: Secondary | ICD-10-CM | POA: Diagnosis not present

## 2021-10-03 DIAGNOSIS — Z79899 Other long term (current) drug therapy: Secondary | ICD-10-CM | POA: Diagnosis not present

## 2021-10-03 DIAGNOSIS — E785 Hyperlipidemia, unspecified: Secondary | ICD-10-CM | POA: Diagnosis not present

## 2021-10-03 DIAGNOSIS — E039 Hypothyroidism, unspecified: Secondary | ICD-10-CM | POA: Diagnosis not present

## 2021-10-03 DIAGNOSIS — Z7409 Other reduced mobility: Secondary | ICD-10-CM | POA: Diagnosis not present

## 2021-10-03 DIAGNOSIS — S82892S Other fracture of left lower leg, sequela: Secondary | ICD-10-CM | POA: Diagnosis not present

## 2021-10-03 DIAGNOSIS — I493 Ventricular premature depolarization: Secondary | ICD-10-CM | POA: Diagnosis not present

## 2021-10-03 DIAGNOSIS — R262 Difficulty in walking, not elsewhere classified: Secondary | ICD-10-CM | POA: Diagnosis not present

## 2021-10-03 DIAGNOSIS — I4821 Permanent atrial fibrillation: Secondary | ICD-10-CM | POA: Diagnosis not present

## 2021-10-03 DIAGNOSIS — I499 Cardiac arrhythmia, unspecified: Secondary | ICD-10-CM | POA: Diagnosis not present

## 2021-10-08 DIAGNOSIS — S82892S Other fracture of left lower leg, sequela: Secondary | ICD-10-CM | POA: Diagnosis not present

## 2021-10-08 DIAGNOSIS — I493 Ventricular premature depolarization: Secondary | ICD-10-CM | POA: Diagnosis not present

## 2021-10-08 DIAGNOSIS — E785 Hyperlipidemia, unspecified: Secondary | ICD-10-CM | POA: Diagnosis not present

## 2021-10-08 DIAGNOSIS — Z789 Other specified health status: Secondary | ICD-10-CM | POA: Diagnosis not present

## 2021-10-08 DIAGNOSIS — I4821 Permanent atrial fibrillation: Secondary | ICD-10-CM | POA: Diagnosis not present

## 2021-10-08 DIAGNOSIS — E039 Hypothyroidism, unspecified: Secondary | ICD-10-CM | POA: Diagnosis not present

## 2021-10-08 DIAGNOSIS — Z7409 Other reduced mobility: Secondary | ICD-10-CM | POA: Diagnosis not present

## 2021-10-08 DIAGNOSIS — R262 Difficulty in walking, not elsewhere classified: Secondary | ICD-10-CM | POA: Diagnosis not present

## 2021-10-15 DIAGNOSIS — I493 Ventricular premature depolarization: Secondary | ICD-10-CM | POA: Diagnosis not present

## 2021-10-17 DIAGNOSIS — S8265XD Nondisplaced fracture of lateral malleolus of left fibula, subsequent encounter for closed fracture with routine healing: Secondary | ICD-10-CM | POA: Diagnosis not present

## 2021-10-17 DIAGNOSIS — R262 Difficulty in walking, not elsewhere classified: Secondary | ICD-10-CM | POA: Diagnosis not present

## 2021-10-17 DIAGNOSIS — Z7409 Other reduced mobility: Secondary | ICD-10-CM | POA: Diagnosis not present

## 2021-10-17 DIAGNOSIS — Z789 Other specified health status: Secondary | ICD-10-CM | POA: Diagnosis not present

## 2021-10-17 DIAGNOSIS — R2689 Other abnormalities of gait and mobility: Secondary | ICD-10-CM | POA: Diagnosis not present

## 2021-10-17 DIAGNOSIS — M2170 Unequal limb length (acquired), unspecified site: Secondary | ICD-10-CM | POA: Diagnosis not present

## 2021-10-17 DIAGNOSIS — S82892S Other fracture of left lower leg, sequela: Secondary | ICD-10-CM | POA: Diagnosis not present

## 2021-10-17 DIAGNOSIS — M2041 Other hammer toe(s) (acquired), right foot: Secondary | ICD-10-CM | POA: Diagnosis not present

## 2021-10-22 DIAGNOSIS — I4891 Unspecified atrial fibrillation: Secondary | ICD-10-CM | POA: Diagnosis not present

## 2021-11-28 DIAGNOSIS — Z7409 Other reduced mobility: Secondary | ICD-10-CM | POA: Diagnosis not present

## 2021-11-28 DIAGNOSIS — R262 Difficulty in walking, not elsewhere classified: Secondary | ICD-10-CM | POA: Diagnosis not present

## 2021-11-28 DIAGNOSIS — S82892S Other fracture of left lower leg, sequela: Secondary | ICD-10-CM | POA: Diagnosis not present

## 2021-11-28 DIAGNOSIS — Z789 Other specified health status: Secondary | ICD-10-CM | POA: Diagnosis not present

## 2021-12-05 DIAGNOSIS — Z7409 Other reduced mobility: Secondary | ICD-10-CM | POA: Diagnosis not present

## 2021-12-05 DIAGNOSIS — S82892S Other fracture of left lower leg, sequela: Secondary | ICD-10-CM | POA: Diagnosis not present

## 2021-12-05 DIAGNOSIS — R262 Difficulty in walking, not elsewhere classified: Secondary | ICD-10-CM | POA: Diagnosis not present

## 2021-12-05 DIAGNOSIS — Z789 Other specified health status: Secondary | ICD-10-CM | POA: Diagnosis not present

## 2022-01-09 DIAGNOSIS — N400 Enlarged prostate without lower urinary tract symptoms: Secondary | ICD-10-CM | POA: Diagnosis not present

## 2022-01-09 DIAGNOSIS — R3129 Other microscopic hematuria: Secondary | ICD-10-CM | POA: Diagnosis not present

## 2022-01-09 DIAGNOSIS — R82998 Other abnormal findings in urine: Secondary | ICD-10-CM | POA: Diagnosis not present

## 2022-01-16 DIAGNOSIS — R262 Difficulty in walking, not elsewhere classified: Secondary | ICD-10-CM | POA: Diagnosis not present

## 2022-01-16 DIAGNOSIS — Z789 Other specified health status: Secondary | ICD-10-CM | POA: Diagnosis not present

## 2022-01-16 DIAGNOSIS — Z7409 Other reduced mobility: Secondary | ICD-10-CM | POA: Diagnosis not present

## 2022-01-16 DIAGNOSIS — S82892S Other fracture of left lower leg, sequela: Secondary | ICD-10-CM | POA: Diagnosis not present

## 2022-01-23 DIAGNOSIS — Z7409 Other reduced mobility: Secondary | ICD-10-CM | POA: Diagnosis not present

## 2022-01-23 DIAGNOSIS — Z789 Other specified health status: Secondary | ICD-10-CM | POA: Diagnosis not present

## 2022-01-23 DIAGNOSIS — M2041 Other hammer toe(s) (acquired), right foot: Secondary | ICD-10-CM | POA: Diagnosis not present

## 2022-01-23 DIAGNOSIS — M7741 Metatarsalgia, right foot: Secondary | ICD-10-CM | POA: Diagnosis not present

## 2022-01-23 DIAGNOSIS — M2042 Other hammer toe(s) (acquired), left foot: Secondary | ICD-10-CM | POA: Diagnosis not present

## 2022-01-23 DIAGNOSIS — S82892S Other fracture of left lower leg, sequela: Secondary | ICD-10-CM | POA: Diagnosis not present

## 2022-01-23 DIAGNOSIS — R262 Difficulty in walking, not elsewhere classified: Secondary | ICD-10-CM | POA: Diagnosis not present

## 2022-01-23 DIAGNOSIS — R2689 Other abnormalities of gait and mobility: Secondary | ICD-10-CM | POA: Diagnosis not present

## 2022-02-06 DIAGNOSIS — S82892S Other fracture of left lower leg, sequela: Secondary | ICD-10-CM | POA: Diagnosis not present

## 2022-02-06 DIAGNOSIS — Z789 Other specified health status: Secondary | ICD-10-CM | POA: Diagnosis not present

## 2022-02-06 DIAGNOSIS — Z7409 Other reduced mobility: Secondary | ICD-10-CM | POA: Diagnosis not present

## 2022-02-06 DIAGNOSIS — R262 Difficulty in walking, not elsewhere classified: Secondary | ICD-10-CM | POA: Diagnosis not present

## 2022-02-27 DIAGNOSIS — Z789 Other specified health status: Secondary | ICD-10-CM | POA: Diagnosis not present

## 2022-02-27 DIAGNOSIS — S82892S Other fracture of left lower leg, sequela: Secondary | ICD-10-CM | POA: Diagnosis not present

## 2022-02-27 DIAGNOSIS — I493 Ventricular premature depolarization: Secondary | ICD-10-CM | POA: Diagnosis not present

## 2022-02-27 DIAGNOSIS — Z7409 Other reduced mobility: Secondary | ICD-10-CM | POA: Diagnosis not present

## 2022-02-27 DIAGNOSIS — I517 Cardiomegaly: Secondary | ICD-10-CM | POA: Diagnosis not present

## 2022-02-27 DIAGNOSIS — R262 Difficulty in walking, not elsewhere classified: Secondary | ICD-10-CM | POA: Diagnosis not present

## 2022-03-06 DIAGNOSIS — Z789 Other specified health status: Secondary | ICD-10-CM | POA: Diagnosis not present

## 2022-03-06 DIAGNOSIS — R262 Difficulty in walking, not elsewhere classified: Secondary | ICD-10-CM | POA: Diagnosis not present

## 2022-03-06 DIAGNOSIS — Z7409 Other reduced mobility: Secondary | ICD-10-CM | POA: Diagnosis not present

## 2022-03-06 DIAGNOSIS — S82892S Other fracture of left lower leg, sequela: Secondary | ICD-10-CM | POA: Diagnosis not present

## 2022-03-14 DIAGNOSIS — N400 Enlarged prostate without lower urinary tract symptoms: Secondary | ICD-10-CM | POA: Diagnosis not present

## 2022-03-14 DIAGNOSIS — N401 Enlarged prostate with lower urinary tract symptoms: Secondary | ICD-10-CM | POA: Diagnosis not present

## 2022-03-20 DIAGNOSIS — Z7409 Other reduced mobility: Secondary | ICD-10-CM | POA: Diagnosis not present

## 2022-03-20 DIAGNOSIS — S82892S Other fracture of left lower leg, sequela: Secondary | ICD-10-CM | POA: Diagnosis not present

## 2022-03-20 DIAGNOSIS — R262 Difficulty in walking, not elsewhere classified: Secondary | ICD-10-CM | POA: Diagnosis not present

## 2022-03-20 DIAGNOSIS — Z789 Other specified health status: Secondary | ICD-10-CM | POA: Diagnosis not present

## 2022-03-27 DIAGNOSIS — S82892S Other fracture of left lower leg, sequela: Secondary | ICD-10-CM | POA: Diagnosis not present

## 2022-03-27 DIAGNOSIS — Z789 Other specified health status: Secondary | ICD-10-CM | POA: Diagnosis not present

## 2022-03-27 DIAGNOSIS — N400 Enlarged prostate without lower urinary tract symptoms: Secondary | ICD-10-CM | POA: Diagnosis not present

## 2022-03-27 DIAGNOSIS — Z7409 Other reduced mobility: Secondary | ICD-10-CM | POA: Diagnosis not present

## 2022-03-27 DIAGNOSIS — R262 Difficulty in walking, not elsewhere classified: Secondary | ICD-10-CM | POA: Diagnosis not present

## 2022-03-27 DIAGNOSIS — R800 Isolated proteinuria: Secondary | ICD-10-CM | POA: Diagnosis not present

## 2022-04-10 DIAGNOSIS — E785 Hyperlipidemia, unspecified: Secondary | ICD-10-CM | POA: Diagnosis not present

## 2022-04-10 DIAGNOSIS — I493 Ventricular premature depolarization: Secondary | ICD-10-CM | POA: Diagnosis not present

## 2022-04-10 DIAGNOSIS — Z7982 Long term (current) use of aspirin: Secondary | ICD-10-CM | POA: Diagnosis not present

## 2022-04-10 DIAGNOSIS — I2510 Atherosclerotic heart disease of native coronary artery without angina pectoris: Secondary | ICD-10-CM | POA: Diagnosis not present

## 2022-04-10 DIAGNOSIS — E039 Hypothyroidism, unspecified: Secondary | ICD-10-CM | POA: Diagnosis not present

## 2022-04-10 DIAGNOSIS — Z86718 Personal history of other venous thrombosis and embolism: Secondary | ICD-10-CM | POA: Diagnosis not present

## 2022-04-10 DIAGNOSIS — I4821 Permanent atrial fibrillation: Secondary | ICD-10-CM | POA: Diagnosis not present

## 2022-04-10 DIAGNOSIS — R2689 Other abnormalities of gait and mobility: Secondary | ICD-10-CM | POA: Diagnosis not present

## 2022-04-17 DIAGNOSIS — R262 Difficulty in walking, not elsewhere classified: Secondary | ICD-10-CM | POA: Diagnosis not present

## 2022-04-17 DIAGNOSIS — Z7409 Other reduced mobility: Secondary | ICD-10-CM | POA: Diagnosis not present

## 2022-04-17 DIAGNOSIS — Z789 Other specified health status: Secondary | ICD-10-CM | POA: Diagnosis not present

## 2022-04-17 DIAGNOSIS — S82892S Other fracture of left lower leg, sequela: Secondary | ICD-10-CM | POA: Diagnosis not present

## 2022-04-24 DIAGNOSIS — S82432A Displaced oblique fracture of shaft of left fibula, initial encounter for closed fracture: Secondary | ICD-10-CM | POA: Diagnosis not present

## 2022-04-24 DIAGNOSIS — R2689 Other abnormalities of gait and mobility: Secondary | ICD-10-CM | POA: Diagnosis not present

## 2022-04-24 DIAGNOSIS — S82892S Other fracture of left lower leg, sequela: Secondary | ICD-10-CM | POA: Diagnosis not present

## 2022-04-24 DIAGNOSIS — R262 Difficulty in walking, not elsewhere classified: Secondary | ICD-10-CM | POA: Diagnosis not present

## 2022-04-24 DIAGNOSIS — R278 Other lack of coordination: Secondary | ICD-10-CM | POA: Diagnosis not present

## 2022-04-24 DIAGNOSIS — R269 Unspecified abnormalities of gait and mobility: Secondary | ICD-10-CM | POA: Diagnosis not present

## 2022-04-24 DIAGNOSIS — Z7409 Other reduced mobility: Secondary | ICD-10-CM | POA: Diagnosis not present

## 2022-04-24 DIAGNOSIS — Z789 Other specified health status: Secondary | ICD-10-CM | POA: Diagnosis not present

## 2022-05-01 DIAGNOSIS — R269 Unspecified abnormalities of gait and mobility: Secondary | ICD-10-CM | POA: Diagnosis not present

## 2022-05-01 DIAGNOSIS — S82432A Displaced oblique fracture of shaft of left fibula, initial encounter for closed fracture: Secondary | ICD-10-CM | POA: Diagnosis not present

## 2022-05-01 DIAGNOSIS — R2689 Other abnormalities of gait and mobility: Secondary | ICD-10-CM | POA: Diagnosis not present

## 2022-05-01 DIAGNOSIS — S82892S Other fracture of left lower leg, sequela: Secondary | ICD-10-CM | POA: Diagnosis not present

## 2022-05-01 DIAGNOSIS — Z7409 Other reduced mobility: Secondary | ICD-10-CM | POA: Diagnosis not present

## 2022-05-01 DIAGNOSIS — R278 Other lack of coordination: Secondary | ICD-10-CM | POA: Diagnosis not present

## 2022-05-01 DIAGNOSIS — R262 Difficulty in walking, not elsewhere classified: Secondary | ICD-10-CM | POA: Diagnosis not present

## 2022-05-01 DIAGNOSIS — Z789 Other specified health status: Secondary | ICD-10-CM | POA: Diagnosis not present

## 2022-05-22 DIAGNOSIS — R269 Unspecified abnormalities of gait and mobility: Secondary | ICD-10-CM | POA: Diagnosis not present

## 2022-05-22 DIAGNOSIS — Z7409 Other reduced mobility: Secondary | ICD-10-CM | POA: Diagnosis not present

## 2022-05-22 DIAGNOSIS — Z789 Other specified health status: Secondary | ICD-10-CM | POA: Diagnosis not present

## 2022-05-22 DIAGNOSIS — R262 Difficulty in walking, not elsewhere classified: Secondary | ICD-10-CM | POA: Diagnosis not present

## 2022-05-22 DIAGNOSIS — S82892S Other fracture of left lower leg, sequela: Secondary | ICD-10-CM | POA: Diagnosis not present

## 2022-05-22 DIAGNOSIS — R278 Other lack of coordination: Secondary | ICD-10-CM | POA: Diagnosis not present

## 2022-05-22 DIAGNOSIS — R2689 Other abnormalities of gait and mobility: Secondary | ICD-10-CM | POA: Diagnosis not present

## 2022-05-22 DIAGNOSIS — S82432A Displaced oblique fracture of shaft of left fibula, initial encounter for closed fracture: Secondary | ICD-10-CM | POA: Diagnosis not present

## 2022-06-05 DIAGNOSIS — M7741 Metatarsalgia, right foot: Secondary | ICD-10-CM | POA: Diagnosis not present

## 2022-06-05 DIAGNOSIS — R2689 Other abnormalities of gait and mobility: Secondary | ICD-10-CM | POA: Diagnosis not present

## 2022-06-05 DIAGNOSIS — M2170 Unequal limb length (acquired), unspecified site: Secondary | ICD-10-CM | POA: Diagnosis not present

## 2022-06-05 DIAGNOSIS — M21171 Varus deformity, not elsewhere classified, right ankle: Secondary | ICD-10-CM | POA: Diagnosis not present

## 2022-06-22 DIAGNOSIS — I4891 Unspecified atrial fibrillation: Secondary | ICD-10-CM | POA: Diagnosis not present

## 2022-06-22 DIAGNOSIS — M47812 Spondylosis without myelopathy or radiculopathy, cervical region: Secondary | ICD-10-CM | POA: Diagnosis not present

## 2022-06-22 DIAGNOSIS — R202 Paresthesia of skin: Secondary | ICD-10-CM | POA: Diagnosis not present

## 2022-06-22 DIAGNOSIS — M542 Cervicalgia: Secondary | ICD-10-CM | POA: Diagnosis not present

## 2022-06-26 DIAGNOSIS — H2513 Age-related nuclear cataract, bilateral: Secondary | ICD-10-CM | POA: Diagnosis not present

## 2022-06-26 DIAGNOSIS — H5015 Alternating exotropia: Secondary | ICD-10-CM | POA: Diagnosis not present

## 2022-06-26 DIAGNOSIS — H5203 Hypermetropia, bilateral: Secondary | ICD-10-CM | POA: Diagnosis not present

## 2022-07-17 DIAGNOSIS — R2689 Other abnormalities of gait and mobility: Secondary | ICD-10-CM | POA: Diagnosis not present

## 2022-07-17 DIAGNOSIS — S82892S Other fracture of left lower leg, sequela: Secondary | ICD-10-CM | POA: Diagnosis not present

## 2022-07-17 DIAGNOSIS — R269 Unspecified abnormalities of gait and mobility: Secondary | ICD-10-CM | POA: Diagnosis not present

## 2022-07-17 DIAGNOSIS — S82432A Displaced oblique fracture of shaft of left fibula, initial encounter for closed fracture: Secondary | ICD-10-CM | POA: Diagnosis not present

## 2022-07-31 DIAGNOSIS — R2689 Other abnormalities of gait and mobility: Secondary | ICD-10-CM | POA: Diagnosis not present

## 2022-07-31 DIAGNOSIS — R278 Other lack of coordination: Secondary | ICD-10-CM | POA: Diagnosis not present

## 2022-07-31 DIAGNOSIS — S82892S Other fracture of left lower leg, sequela: Secondary | ICD-10-CM | POA: Diagnosis not present

## 2022-07-31 DIAGNOSIS — R262 Difficulty in walking, not elsewhere classified: Secondary | ICD-10-CM | POA: Diagnosis not present

## 2022-07-31 DIAGNOSIS — R269 Unspecified abnormalities of gait and mobility: Secondary | ICD-10-CM | POA: Diagnosis not present

## 2022-07-31 DIAGNOSIS — S82432A Displaced oblique fracture of shaft of left fibula, initial encounter for closed fracture: Secondary | ICD-10-CM | POA: Diagnosis not present

## 2022-08-07 DIAGNOSIS — M50323 Other cervical disc degeneration at C6-C7 level: Secondary | ICD-10-CM | POA: Diagnosis not present

## 2022-08-07 DIAGNOSIS — M5031 Other cervical disc degeneration,  high cervical region: Secondary | ICD-10-CM | POA: Diagnosis not present

## 2022-08-07 DIAGNOSIS — M4312 Spondylolisthesis, cervical region: Secondary | ICD-10-CM | POA: Diagnosis not present

## 2022-08-07 DIAGNOSIS — M50321 Other cervical disc degeneration at C4-C5 level: Secondary | ICD-10-CM | POA: Diagnosis not present

## 2022-08-07 DIAGNOSIS — Z981 Arthrodesis status: Secondary | ICD-10-CM | POA: Diagnosis not present

## 2022-08-07 DIAGNOSIS — M50322 Other cervical disc degeneration at C5-C6 level: Secondary | ICD-10-CM | POA: Diagnosis not present

## 2022-08-07 DIAGNOSIS — M47812 Spondylosis without myelopathy or radiculopathy, cervical region: Secondary | ICD-10-CM | POA: Diagnosis not present

## 2022-08-14 DIAGNOSIS — R278 Other lack of coordination: Secondary | ICD-10-CM | POA: Diagnosis not present

## 2022-08-14 DIAGNOSIS — S82892S Other fracture of left lower leg, sequela: Secondary | ICD-10-CM | POA: Diagnosis not present

## 2022-08-14 DIAGNOSIS — S82432A Displaced oblique fracture of shaft of left fibula, initial encounter for closed fracture: Secondary | ICD-10-CM | POA: Diagnosis not present

## 2022-08-14 DIAGNOSIS — R269 Unspecified abnormalities of gait and mobility: Secondary | ICD-10-CM | POA: Diagnosis not present

## 2022-08-14 DIAGNOSIS — R2689 Other abnormalities of gait and mobility: Secondary | ICD-10-CM | POA: Diagnosis not present

## 2022-08-14 DIAGNOSIS — R262 Difficulty in walking, not elsewhere classified: Secondary | ICD-10-CM | POA: Diagnosis not present

## 2022-08-21 DIAGNOSIS — R278 Other lack of coordination: Secondary | ICD-10-CM | POA: Diagnosis not present

## 2022-08-21 DIAGNOSIS — R269 Unspecified abnormalities of gait and mobility: Secondary | ICD-10-CM | POA: Diagnosis not present

## 2022-08-21 DIAGNOSIS — S82432A Displaced oblique fracture of shaft of left fibula, initial encounter for closed fracture: Secondary | ICD-10-CM | POA: Diagnosis not present

## 2022-08-21 DIAGNOSIS — R2689 Other abnormalities of gait and mobility: Secondary | ICD-10-CM | POA: Diagnosis not present

## 2022-08-21 DIAGNOSIS — R262 Difficulty in walking, not elsewhere classified: Secondary | ICD-10-CM | POA: Diagnosis not present

## 2022-08-21 DIAGNOSIS — S82892S Other fracture of left lower leg, sequela: Secondary | ICD-10-CM | POA: Diagnosis not present

## 2022-08-28 DIAGNOSIS — R269 Unspecified abnormalities of gait and mobility: Secondary | ICD-10-CM | POA: Diagnosis not present

## 2022-08-28 DIAGNOSIS — S82892S Other fracture of left lower leg, sequela: Secondary | ICD-10-CM | POA: Diagnosis not present

## 2022-08-28 DIAGNOSIS — S82432A Displaced oblique fracture of shaft of left fibula, initial encounter for closed fracture: Secondary | ICD-10-CM | POA: Diagnosis not present

## 2022-08-28 DIAGNOSIS — R2689 Other abnormalities of gait and mobility: Secondary | ICD-10-CM | POA: Diagnosis not present

## 2022-09-11 DIAGNOSIS — M2041 Other hammer toe(s) (acquired), right foot: Secondary | ICD-10-CM | POA: Diagnosis not present

## 2022-09-11 DIAGNOSIS — S82432A Displaced oblique fracture of shaft of left fibula, initial encounter for closed fracture: Secondary | ICD-10-CM | POA: Diagnosis not present

## 2022-09-11 DIAGNOSIS — S82892S Other fracture of left lower leg, sequela: Secondary | ICD-10-CM | POA: Diagnosis not present

## 2022-09-11 DIAGNOSIS — R2689 Other abnormalities of gait and mobility: Secondary | ICD-10-CM | POA: Diagnosis not present

## 2022-09-11 DIAGNOSIS — M2170 Unequal limb length (acquired), unspecified site: Secondary | ICD-10-CM | POA: Diagnosis not present

## 2022-09-11 DIAGNOSIS — M21171 Varus deformity, not elsewhere classified, right ankle: Secondary | ICD-10-CM | POA: Diagnosis not present

## 2022-09-11 DIAGNOSIS — R269 Unspecified abnormalities of gait and mobility: Secondary | ICD-10-CM | POA: Diagnosis not present

## 2022-09-11 DIAGNOSIS — M25871 Other specified joint disorders, right ankle and foot: Secondary | ICD-10-CM | POA: Diagnosis not present

## 2022-09-18 DIAGNOSIS — D225 Melanocytic nevi of trunk: Secondary | ICD-10-CM | POA: Diagnosis not present

## 2022-09-18 DIAGNOSIS — S82892S Other fracture of left lower leg, sequela: Secondary | ICD-10-CM | POA: Diagnosis not present

## 2022-09-18 DIAGNOSIS — R269 Unspecified abnormalities of gait and mobility: Secondary | ICD-10-CM | POA: Diagnosis not present

## 2022-09-18 DIAGNOSIS — L814 Other melanin hyperpigmentation: Secondary | ICD-10-CM | POA: Diagnosis not present

## 2022-09-18 DIAGNOSIS — R2689 Other abnormalities of gait and mobility: Secondary | ICD-10-CM | POA: Diagnosis not present

## 2022-09-18 DIAGNOSIS — L821 Other seborrheic keratosis: Secondary | ICD-10-CM | POA: Diagnosis not present

## 2022-09-18 DIAGNOSIS — S82432A Displaced oblique fracture of shaft of left fibula, initial encounter for closed fracture: Secondary | ICD-10-CM | POA: Diagnosis not present

## 2022-09-18 DIAGNOSIS — X32XXXA Exposure to sunlight, initial encounter: Secondary | ICD-10-CM | POA: Diagnosis not present

## 2022-09-25 DIAGNOSIS — R269 Unspecified abnormalities of gait and mobility: Secondary | ICD-10-CM | POA: Diagnosis not present

## 2022-09-25 DIAGNOSIS — R2689 Other abnormalities of gait and mobility: Secondary | ICD-10-CM | POA: Diagnosis not present

## 2022-09-25 DIAGNOSIS — S82892S Other fracture of left lower leg, sequela: Secondary | ICD-10-CM | POA: Diagnosis not present

## 2022-09-25 DIAGNOSIS — S82432A Displaced oblique fracture of shaft of left fibula, initial encounter for closed fracture: Secondary | ICD-10-CM | POA: Diagnosis not present

## 2022-10-09 DIAGNOSIS — R109 Unspecified abdominal pain: Secondary | ICD-10-CM | POA: Diagnosis not present

## 2022-10-09 DIAGNOSIS — Z86010 Personal history of colonic polyps: Secondary | ICD-10-CM | POA: Diagnosis not present

## 2022-10-09 DIAGNOSIS — K219 Gastro-esophageal reflux disease without esophagitis: Secondary | ICD-10-CM | POA: Diagnosis not present

## 2022-10-09 DIAGNOSIS — R142 Eructation: Secondary | ICD-10-CM | POA: Diagnosis not present

## 2022-10-09 DIAGNOSIS — S82432A Displaced oblique fracture of shaft of left fibula, initial encounter for closed fracture: Secondary | ICD-10-CM | POA: Diagnosis not present

## 2022-10-09 DIAGNOSIS — R269 Unspecified abnormalities of gait and mobility: Secondary | ICD-10-CM | POA: Diagnosis not present

## 2022-10-09 DIAGNOSIS — S82892S Other fracture of left lower leg, sequela: Secondary | ICD-10-CM | POA: Diagnosis not present

## 2022-10-09 DIAGNOSIS — R2689 Other abnormalities of gait and mobility: Secondary | ICD-10-CM | POA: Diagnosis not present

## 2022-10-16 DIAGNOSIS — E039 Hypothyroidism, unspecified: Secondary | ICD-10-CM | POA: Diagnosis not present

## 2022-10-16 DIAGNOSIS — Z125 Encounter for screening for malignant neoplasm of prostate: Secondary | ICD-10-CM | POA: Diagnosis not present

## 2022-10-16 DIAGNOSIS — I4811 Longstanding persistent atrial fibrillation: Secondary | ICD-10-CM | POA: Diagnosis not present

## 2022-10-16 DIAGNOSIS — Z0000 Encounter for general adult medical examination without abnormal findings: Secondary | ICD-10-CM | POA: Diagnosis not present

## 2022-10-16 DIAGNOSIS — E559 Vitamin D deficiency, unspecified: Secondary | ICD-10-CM | POA: Diagnosis not present

## 2022-10-16 DIAGNOSIS — E7800 Pure hypercholesterolemia, unspecified: Secondary | ICD-10-CM | POA: Diagnosis not present

## 2022-10-23 DIAGNOSIS — S82892S Other fracture of left lower leg, sequela: Secondary | ICD-10-CM | POA: Diagnosis not present

## 2022-10-23 DIAGNOSIS — R269 Unspecified abnormalities of gait and mobility: Secondary | ICD-10-CM | POA: Diagnosis not present

## 2022-10-23 DIAGNOSIS — R718 Other abnormality of red blood cells: Secondary | ICD-10-CM | POA: Diagnosis not present

## 2022-10-23 DIAGNOSIS — S82432A Displaced oblique fracture of shaft of left fibula, initial encounter for closed fracture: Secondary | ICD-10-CM | POA: Diagnosis not present

## 2022-10-23 DIAGNOSIS — R2689 Other abnormalities of gait and mobility: Secondary | ICD-10-CM | POA: Diagnosis not present

## 2022-10-30 DIAGNOSIS — K5730 Diverticulosis of large intestine without perforation or abscess without bleeding: Secondary | ICD-10-CM | POA: Diagnosis not present

## 2022-10-30 DIAGNOSIS — I4891 Unspecified atrial fibrillation: Secondary | ICD-10-CM | POA: Diagnosis not present

## 2022-10-30 DIAGNOSIS — Z1211 Encounter for screening for malignant neoplasm of colon: Secondary | ICD-10-CM | POA: Diagnosis not present

## 2022-10-30 DIAGNOSIS — Z09 Encounter for follow-up examination after completed treatment for conditions other than malignant neoplasm: Secondary | ICD-10-CM | POA: Diagnosis not present

## 2022-10-30 DIAGNOSIS — Z86010 Personal history of colonic polyps: Secondary | ICD-10-CM | POA: Diagnosis not present

## 2022-11-06 DIAGNOSIS — R269 Unspecified abnormalities of gait and mobility: Secondary | ICD-10-CM | POA: Diagnosis not present

## 2022-11-06 DIAGNOSIS — S82892S Other fracture of left lower leg, sequela: Secondary | ICD-10-CM | POA: Diagnosis not present

## 2022-11-06 DIAGNOSIS — S82432A Displaced oblique fracture of shaft of left fibula, initial encounter for closed fracture: Secondary | ICD-10-CM | POA: Diagnosis not present

## 2022-11-06 DIAGNOSIS — R2689 Other abnormalities of gait and mobility: Secondary | ICD-10-CM | POA: Diagnosis not present

## 2022-11-20 DIAGNOSIS — S82892S Other fracture of left lower leg, sequela: Secondary | ICD-10-CM | POA: Diagnosis not present

## 2022-11-20 DIAGNOSIS — R2689 Other abnormalities of gait and mobility: Secondary | ICD-10-CM | POA: Diagnosis not present

## 2022-11-20 DIAGNOSIS — R269 Unspecified abnormalities of gait and mobility: Secondary | ICD-10-CM | POA: Diagnosis not present

## 2022-11-20 DIAGNOSIS — S82432A Displaced oblique fracture of shaft of left fibula, initial encounter for closed fracture: Secondary | ICD-10-CM | POA: Diagnosis not present

## 2023-01-23 DIAGNOSIS — R0981 Nasal congestion: Secondary | ICD-10-CM | POA: Diagnosis not present

## 2023-01-23 DIAGNOSIS — B349 Viral infection, unspecified: Secondary | ICD-10-CM | POA: Diagnosis not present

## 2023-01-23 DIAGNOSIS — Z1330 Encounter for screening examination for mental health and behavioral disorders, unspecified: Secondary | ICD-10-CM | POA: Diagnosis not present

## 2023-01-23 DIAGNOSIS — H6123 Impacted cerumen, bilateral: Secondary | ICD-10-CM | POA: Diagnosis not present

## 2023-01-23 DIAGNOSIS — H60333 Swimmer's ear, bilateral: Secondary | ICD-10-CM | POA: Diagnosis not present

## 2023-01-24 DIAGNOSIS — H6123 Impacted cerumen, bilateral: Secondary | ICD-10-CM | POA: Diagnosis not present

## 2023-01-29 DIAGNOSIS — J40 Bronchitis, not specified as acute or chronic: Secondary | ICD-10-CM | POA: Diagnosis not present
